# Patient Record
Sex: Female | Born: 1945 | Race: Black or African American | Hispanic: No | Smoking: Never smoker
Health system: Southern US, Community
[De-identification: ages and names within clinical notes are randomized; demographics above are authoritative.]

## PROBLEM LIST (undated history)

## (undated) DIAGNOSIS — R51 Headache: Secondary | ICD-10-CM

## (undated) DIAGNOSIS — Z8619 Personal history of other infectious and parasitic diseases: Secondary | ICD-10-CM

## (undated) DIAGNOSIS — R519 Headache, unspecified: Secondary | ICD-10-CM

## (undated) DIAGNOSIS — E119 Type 2 diabetes mellitus without complications: Secondary | ICD-10-CM

## (undated) DIAGNOSIS — R1310 Dysphagia, unspecified: Secondary | ICD-10-CM

## (undated) DIAGNOSIS — M549 Dorsalgia, unspecified: Secondary | ICD-10-CM

## (undated) DIAGNOSIS — E785 Hyperlipidemia, unspecified: Secondary | ICD-10-CM

## (undated) DIAGNOSIS — T7840XA Allergy, unspecified, initial encounter: Secondary | ICD-10-CM

## (undated) DIAGNOSIS — K219 Gastro-esophageal reflux disease without esophagitis: Secondary | ICD-10-CM

## (undated) DIAGNOSIS — I10 Essential (primary) hypertension: Secondary | ICD-10-CM

## (undated) DIAGNOSIS — M1990 Unspecified osteoarthritis, unspecified site: Secondary | ICD-10-CM

## (undated) DIAGNOSIS — K259 Gastric ulcer, unspecified as acute or chronic, without hemorrhage or perforation: Secondary | ICD-10-CM

## (undated) DIAGNOSIS — G43909 Migraine, unspecified, not intractable, without status migrainosus: Secondary | ICD-10-CM

## (undated) DIAGNOSIS — E079 Disorder of thyroid, unspecified: Secondary | ICD-10-CM

## (undated) DIAGNOSIS — J45909 Unspecified asthma, uncomplicated: Secondary | ICD-10-CM

## (undated) HISTORY — DX: Dysphagia, unspecified: R13.10

## (undated) HISTORY — PX: CHOLECYSTECTOMY: SHX55

## (undated) HISTORY — DX: Personal history of other infectious and parasitic diseases: Z86.19

## (undated) HISTORY — DX: Headache, unspecified: R51.9

## (undated) HISTORY — DX: Disorder of thyroid, unspecified: E07.9

## (undated) HISTORY — DX: Unspecified osteoarthritis, unspecified site: M19.90

## (undated) HISTORY — DX: Migraine, unspecified, not intractable, without status migrainosus: G43.909

## (undated) HISTORY — DX: Headache: R51

## (undated) HISTORY — DX: Unspecified asthma, uncomplicated: J45.909

## (undated) HISTORY — DX: Gastro-esophageal reflux disease without esophagitis: K21.9

## (undated) HISTORY — PX: CATARACT EXTRACTION: SUR2

## (undated) HISTORY — PX: ESOPHAGEAL DILATION: SHX303

## (undated) HISTORY — DX: Dorsalgia, unspecified: M54.9

## (undated) HISTORY — PX: ABDOMINAL HYSTERECTOMY: SHX81

## (undated) HISTORY — DX: Allergy, unspecified, initial encounter: T78.40XA

## (undated) HISTORY — DX: Gastric ulcer, unspecified as acute or chronic, without hemorrhage or perforation: K25.9

## (undated) HISTORY — DX: Hyperlipidemia, unspecified: E78.5

---

## 2009-11-02 ENCOUNTER — Emergency Department (HOSPITAL_BASED_OUTPATIENT_CLINIC_OR_DEPARTMENT_OTHER): Admission: EM | Admit: 2009-11-02 | Discharge: 2009-11-02 | Payer: Self-pay | Admitting: Emergency Medicine

## 2009-11-02 ENCOUNTER — Ambulatory Visit: Payer: Self-pay | Admitting: Diagnostic Radiology

## 2011-02-25 ENCOUNTER — Inpatient Hospital Stay (INDEPENDENT_AMBULATORY_CARE_PROVIDER_SITE_OTHER)
Admission: RE | Admit: 2011-02-25 | Discharge: 2011-02-25 | Disposition: A | Discharge status: Home / Self Care | Payer: Medicare Other | Source: Ambulatory Visit | Attending: Family Medicine | Admitting: Family Medicine

## 2011-02-25 ENCOUNTER — Ambulatory Visit (INDEPENDENT_AMBULATORY_CARE_PROVIDER_SITE_OTHER): Payer: Medicare Other

## 2011-02-25 DIAGNOSIS — J309 Allergic rhinitis, unspecified: Secondary | ICD-10-CM

## 2011-02-26 ENCOUNTER — Emergency Department (HOSPITAL_COMMUNITY)
Admission: EM | Admit: 2011-02-26 | Discharge: 2011-02-26 | Disposition: A | Discharge status: Home / Self Care | Payer: Medicare Other | Attending: Emergency Medicine | Admitting: Emergency Medicine

## 2011-02-26 ENCOUNTER — Inpatient Hospital Stay (INDEPENDENT_AMBULATORY_CARE_PROVIDER_SITE_OTHER)
Admission: RE | Admit: 2011-02-26 | Discharge: 2011-02-26 | Disposition: A | Discharge status: Home / Self Care | Payer: Medicare Other | Source: Ambulatory Visit | Attending: Family Medicine | Admitting: Family Medicine

## 2011-02-26 ENCOUNTER — Emergency Department (HOSPITAL_COMMUNITY): Payer: Medicare Other

## 2011-02-26 DIAGNOSIS — R05 Cough: Secondary | ICD-10-CM | POA: Insufficient documentation

## 2011-02-26 DIAGNOSIS — R079 Chest pain, unspecified: Secondary | ICD-10-CM

## 2011-02-26 DIAGNOSIS — R071 Chest pain on breathing: Secondary | ICD-10-CM | POA: Insufficient documentation

## 2011-02-26 DIAGNOSIS — I10 Essential (primary) hypertension: Secondary | ICD-10-CM | POA: Insufficient documentation

## 2011-02-26 DIAGNOSIS — M79609 Pain in unspecified limb: Secondary | ICD-10-CM | POA: Insufficient documentation

## 2011-02-26 DIAGNOSIS — R0602 Shortness of breath: Secondary | ICD-10-CM | POA: Insufficient documentation

## 2011-02-26 DIAGNOSIS — R059 Cough, unspecified: Secondary | ICD-10-CM | POA: Insufficient documentation

## 2011-02-26 DIAGNOSIS — E039 Hypothyroidism, unspecified: Secondary | ICD-10-CM | POA: Insufficient documentation

## 2011-02-26 DIAGNOSIS — Z9889 Other specified postprocedural states: Secondary | ICD-10-CM | POA: Insufficient documentation

## 2011-02-26 LAB — BASIC METABOLIC PANEL
BUN: 8 mg/dL (ref 6–23)
CO2: 24 mEq/L (ref 19–32)
Calcium: 9.8 mg/dL (ref 8.4–10.5)
Chloride: 95 mEq/L — ABNORMAL LOW (ref 96–112)
Creatinine, Ser: 0.72 mg/dL (ref 0.50–1.10)
GFR calc Af Amer: >60 mL/min (ref >60)
GFR calc non Af Amer: >60 mL/min (ref >60)
Glucose, Bld: 89 mg/dL (ref 70–99)
Potassium: 4.2 mEq/L (ref 3.5–5.1)
Sodium: 132 mEq/L — ABNORMAL LOW (ref 135–145)

## 2011-02-26 LAB — CBC
HCT: 39.9 % (ref 36.0–46.0)
Hemoglobin: 12.9 g/dL (ref 12.0–15.0)
MCH: 25.6 pg — ABNORMAL LOW (ref 26.0–34.0)
MCHC: 32.3 g/dL (ref 30.0–36.0)
MCV: 79.3 fL (ref 78.0–100.0)
Platelets: 258 10*3/uL (ref 150–400)
RBC: 5.03 MIL/uL (ref 3.87–5.11)
RDW: 14.7 % (ref 11.5–15.5)
WBC: 4.8 10*3/uL (ref 4.0–10.5)

## 2011-02-26 LAB — DIFFERENTIAL
Basophils Absolute: 0 10*3/uL (ref 0.0–0.1)
Basophils Relative: 0 % (ref 0–1)
Eosinophils Absolute: 0.1 10*3/uL (ref 0.0–0.7)
Eosinophils Relative: 2 % (ref 0–5)
Lymphocytes Relative: 41 % (ref 12–46)
Lymphs Abs: 2 10*3/uL (ref 0.7–4.0)
Monocytes Absolute: 0.4 10*3/uL (ref 0.1–1.0)
Monocytes Relative: 9 % (ref 3–12)
Neutro Abs: 2.4 10*3/uL (ref 1.7–7.7)
Neutrophils Relative %: 49 % (ref 43–77)

## 2011-02-26 LAB — TROPONIN I: Troponin I: <0.3 ng/mL (ref <0.30)

## 2011-02-26 LAB — CK TOTAL AND CKMB (NOT AT ARMC)
CK, MB: 5.4 ng/mL — ABNORMAL HIGH (ref 0.3–4.0)
Relative Index: 1.3 (ref 0.0–2.5)
Total CK: 410 U/L — ABNORMAL HIGH (ref 7–177)

## 2011-02-26 LAB — D-DIMER, QUANTITATIVE: D-Dimer, Quant: 0.45 ug/mL-FEU (ref 0.00–0.48)

## 2012-09-26 ENCOUNTER — Emergency Department (HOSPITAL_COMMUNITY)
Admission: EM | Admit: 2012-09-26 | Discharge: 2012-09-26 | Disposition: A | Discharge status: Home / Self Care | Payer: No Typology Code available for payment source | Attending: Emergency Medicine | Admitting: Emergency Medicine

## 2012-09-26 ENCOUNTER — Encounter (HOSPITAL_COMMUNITY): Payer: Self-pay | Admitting: Emergency Medicine

## 2012-09-26 DIAGNOSIS — Y9389 Activity, other specified: Secondary | ICD-10-CM | POA: Insufficient documentation

## 2012-09-26 DIAGNOSIS — E119 Type 2 diabetes mellitus without complications: Secondary | ICD-10-CM | POA: Insufficient documentation

## 2012-09-26 DIAGNOSIS — S46909A Unspecified injury of unspecified muscle, fascia and tendon at shoulder and upper arm level, unspecified arm, initial encounter: Secondary | ICD-10-CM | POA: Insufficient documentation

## 2012-09-26 DIAGNOSIS — IMO0002 Reserved for concepts with insufficient information to code with codable children: Secondary | ICD-10-CM | POA: Insufficient documentation

## 2012-09-26 DIAGNOSIS — R51 Headache: Secondary | ICD-10-CM | POA: Insufficient documentation

## 2012-09-26 DIAGNOSIS — Y92410 Unspecified street and highway as the place of occurrence of the external cause: Secondary | ICD-10-CM | POA: Insufficient documentation

## 2012-09-26 DIAGNOSIS — S298XXA Other specified injuries of thorax, initial encounter: Secondary | ICD-10-CM | POA: Insufficient documentation

## 2012-09-26 DIAGNOSIS — S4980XA Other specified injuries of shoulder and upper arm, unspecified arm, initial encounter: Secondary | ICD-10-CM | POA: Insufficient documentation

## 2012-09-26 DIAGNOSIS — I10 Essential (primary) hypertension: Secondary | ICD-10-CM | POA: Insufficient documentation

## 2012-09-26 DIAGNOSIS — T148XXA Other injury of unspecified body region, initial encounter: Secondary | ICD-10-CM

## 2012-09-26 DIAGNOSIS — R110 Nausea: Secondary | ICD-10-CM | POA: Insufficient documentation

## 2012-09-26 HISTORY — DX: Essential (primary) hypertension: I10

## 2012-09-26 HISTORY — DX: Type 2 diabetes mellitus without complications: E11.9

## 2012-09-26 MED ORDER — CYCLOBENZAPRINE HCL 10 MG PO TABS
5.0000 mg | ORAL_TABLET | Freq: Once | ORAL | Status: AC
  Administered 2012-09-26: 5 mg via ORAL
  Filled 2012-09-26: qty 1

## 2012-09-26 MED ORDER — CYCLOBENZAPRINE HCL 10 MG PO TABS: 5.0000 mg | ORAL_TABLET | Freq: Two times a day (BID) | ORAL | Status: DC | PRN
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
## 2012-09-26 NOTE — ED Provider Notes (Deleted)
I  reviewed the resident's note and I agree with the findings and plan.     Nelia Shi, MD 09/26/12 [phone]

## 2015-09-28 DIAGNOSIS — Z8673 Personal history of transient ischemic attack (TIA), and cerebral infarction without residual deficits: Secondary | ICD-10-CM | POA: Insufficient documentation

## 2015-09-28 DIAGNOSIS — G43909 Migraine, unspecified, not intractable, without status migrainosus: Secondary | ICD-10-CM | POA: Insufficient documentation

## 2015-09-28 DIAGNOSIS — J300 Vasomotor rhinitis: Secondary | ICD-10-CM | POA: Insufficient documentation

## 2015-09-28 DIAGNOSIS — J449 Chronic obstructive pulmonary disease, unspecified: Secondary | ICD-10-CM | POA: Insufficient documentation

## 2015-09-28 DIAGNOSIS — K5909 Other constipation: Secondary | ICD-10-CM | POA: Insufficient documentation

## 2015-09-28 DIAGNOSIS — I739 Peripheral vascular disease, unspecified: Secondary | ICD-10-CM | POA: Insufficient documentation

## 2015-09-28 DIAGNOSIS — E669 Obesity, unspecified: Secondary | ICD-10-CM | POA: Insufficient documentation

## 2015-09-28 DIAGNOSIS — K219 Gastro-esophageal reflux disease without esophagitis: Secondary | ICD-10-CM | POA: Insufficient documentation

## 2016-04-23 DIAGNOSIS — K22719 Barrett's esophagus with dysplasia, unspecified: Secondary | ICD-10-CM | POA: Insufficient documentation

## 2016-05-18 LAB — HM COLONOSCOPY

## 2016-05-31 LAB — HM DEXA SCAN: HM Dexa Scan: NORMAL

## 2016-07-07 DIAGNOSIS — I10 Essential (primary) hypertension: Secondary | ICD-10-CM | POA: Diagnosis not present

## 2016-07-07 DIAGNOSIS — E7800 Pure hypercholesterolemia, unspecified: Secondary | ICD-10-CM | POA: Diagnosis not present

## 2016-07-07 DIAGNOSIS — M8589 Other specified disorders of bone density and structure, multiple sites: Secondary | ICD-10-CM | POA: Diagnosis not present

## 2016-07-07 DIAGNOSIS — R69 Illness, unspecified: Secondary | ICD-10-CM | POA: Diagnosis not present

## 2016-07-07 DIAGNOSIS — E042 Nontoxic multinodular goiter: Secondary | ICD-10-CM | POA: Diagnosis not present

## 2016-07-07 DIAGNOSIS — Z0000 Encounter for general adult medical examination without abnormal findings: Secondary | ICD-10-CM | POA: Diagnosis not present

## 2016-07-07 DIAGNOSIS — E119 Type 2 diabetes mellitus without complications: Secondary | ICD-10-CM | POA: Diagnosis not present

## 2016-07-08 DIAGNOSIS — E042 Nontoxic multinodular goiter: Secondary | ICD-10-CM | POA: Diagnosis not present

## 2016-07-14 DIAGNOSIS — R69 Illness, unspecified: Secondary | ICD-10-CM | POA: Diagnosis not present

## 2016-07-20 DIAGNOSIS — K210 Gastro-esophageal reflux disease with esophagitis: Secondary | ICD-10-CM | POA: Diagnosis not present

## 2016-07-20 DIAGNOSIS — E042 Nontoxic multinodular goiter: Secondary | ICD-10-CM | POA: Diagnosis not present

## 2016-08-16 DIAGNOSIS — B373 Candidiasis of vulva and vagina: Secondary | ICD-10-CM | POA: Diagnosis not present

## 2016-09-05 DIAGNOSIS — E669 Obesity, unspecified: Secondary | ICD-10-CM | POA: Diagnosis not present

## 2016-09-05 DIAGNOSIS — Z7951 Long term (current) use of inhaled steroids: Secondary | ICD-10-CM | POA: Diagnosis not present

## 2016-09-05 DIAGNOSIS — J449 Chronic obstructive pulmonary disease, unspecified: Secondary | ICD-10-CM | POA: Diagnosis not present

## 2016-09-05 DIAGNOSIS — E119 Type 2 diabetes mellitus without complications: Secondary | ICD-10-CM | POA: Diagnosis not present

## 2016-09-05 DIAGNOSIS — E7800 Pure hypercholesterolemia, unspecified: Secondary | ICD-10-CM | POA: Diagnosis not present

## 2016-09-05 DIAGNOSIS — I10 Essential (primary) hypertension: Secondary | ICD-10-CM | POA: Diagnosis not present

## 2016-09-05 DIAGNOSIS — Z6832 Body mass index (BMI) 32.0-32.9, adult: Secondary | ICD-10-CM | POA: Diagnosis not present

## 2016-09-05 DIAGNOSIS — Z0000 Encounter for general adult medical examination without abnormal findings: Secondary | ICD-10-CM | POA: Diagnosis not present

## 2016-09-05 DIAGNOSIS — Z7984 Long term (current) use of oral hypoglycemic drugs: Secondary | ICD-10-CM | POA: Diagnosis not present

## 2016-09-05 DIAGNOSIS — R69 Illness, unspecified: Secondary | ICD-10-CM | POA: Diagnosis not present

## 2016-09-07 DIAGNOSIS — R69 Illness, unspecified: Secondary | ICD-10-CM | POA: Diagnosis not present

## 2016-09-10 DIAGNOSIS — K0889 Other specified disorders of teeth and supporting structures: Secondary | ICD-10-CM | POA: Diagnosis not present

## 2016-09-10 DIAGNOSIS — M25522 Pain in left elbow: Secondary | ICD-10-CM | POA: Diagnosis not present

## 2016-09-10 DIAGNOSIS — M5412 Radiculopathy, cervical region: Secondary | ICD-10-CM | POA: Insufficient documentation

## 2016-10-05 DIAGNOSIS — E119 Type 2 diabetes mellitus without complications: Secondary | ICD-10-CM | POA: Diagnosis not present

## 2016-10-05 DIAGNOSIS — J300 Vasomotor rhinitis: Secondary | ICD-10-CM | POA: Diagnosis not present

## 2016-10-05 DIAGNOSIS — M8589 Other specified disorders of bone density and structure, multiple sites: Secondary | ICD-10-CM | POA: Insufficient documentation

## 2016-10-05 DIAGNOSIS — L089 Local infection of the skin and subcutaneous tissue, unspecified: Secondary | ICD-10-CM | POA: Diagnosis not present

## 2016-10-05 DIAGNOSIS — R51 Headache: Secondary | ICD-10-CM | POA: Diagnosis not present

## 2016-10-06 DIAGNOSIS — L089 Local infection of the skin and subcutaneous tissue, unspecified: Secondary | ICD-10-CM | POA: Insufficient documentation

## 2016-10-14 DIAGNOSIS — E119 Type 2 diabetes mellitus without complications: Secondary | ICD-10-CM | POA: Diagnosis not present

## 2016-10-17 DIAGNOSIS — M62838 Other muscle spasm: Secondary | ICD-10-CM | POA: Diagnosis not present

## 2016-10-17 DIAGNOSIS — E7800 Pure hypercholesterolemia, unspecified: Secondary | ICD-10-CM | POA: Diagnosis not present

## 2016-10-17 DIAGNOSIS — J3089 Other allergic rhinitis: Secondary | ICD-10-CM | POA: Diagnosis not present

## 2016-10-17 DIAGNOSIS — R109 Unspecified abdominal pain: Secondary | ICD-10-CM | POA: Diagnosis not present

## 2016-10-17 DIAGNOSIS — E119 Type 2 diabetes mellitus without complications: Secondary | ICD-10-CM | POA: Diagnosis not present

## 2016-10-17 DIAGNOSIS — J300 Vasomotor rhinitis: Secondary | ICD-10-CM | POA: Diagnosis not present

## 2016-10-17 DIAGNOSIS — Z0000 Encounter for general adult medical examination without abnormal findings: Secondary | ICD-10-CM | POA: Diagnosis not present

## 2016-10-17 DIAGNOSIS — Z23 Encounter for immunization: Secondary | ICD-10-CM | POA: Diagnosis not present

## 2016-10-18 DIAGNOSIS — E119 Type 2 diabetes mellitus without complications: Secondary | ICD-10-CM | POA: Diagnosis not present

## 2016-11-03 DIAGNOSIS — H2513 Age-related nuclear cataract, bilateral: Secondary | ICD-10-CM | POA: Diagnosis not present

## 2016-11-03 DIAGNOSIS — H25013 Cortical age-related cataract, bilateral: Secondary | ICD-10-CM | POA: Diagnosis not present

## 2016-11-03 DIAGNOSIS — H52203 Unspecified astigmatism, bilateral: Secondary | ICD-10-CM | POA: Diagnosis not present

## 2016-11-03 DIAGNOSIS — E119 Type 2 diabetes mellitus without complications: Secondary | ICD-10-CM | POA: Diagnosis not present

## 2016-11-03 DIAGNOSIS — H524 Presbyopia: Secondary | ICD-10-CM | POA: Diagnosis not present

## 2016-11-03 DIAGNOSIS — H5203 Hypermetropia, bilateral: Secondary | ICD-10-CM | POA: Diagnosis not present

## 2016-12-16 DIAGNOSIS — R69 Illness, unspecified: Secondary | ICD-10-CM | POA: Diagnosis not present

## 2017-02-02 DIAGNOSIS — R69 Illness, unspecified: Secondary | ICD-10-CM | POA: Diagnosis not present

## 2017-02-21 DIAGNOSIS — R69 Illness, unspecified: Secondary | ICD-10-CM | POA: Diagnosis not present

## 2017-04-25 DIAGNOSIS — R69 Illness, unspecified: Secondary | ICD-10-CM | POA: Diagnosis not present

## 2017-04-26 DIAGNOSIS — Z111 Encounter for screening for respiratory tuberculosis: Secondary | ICD-10-CM | POA: Diagnosis not present

## 2017-06-27 DIAGNOSIS — R69 Illness, unspecified: Secondary | ICD-10-CM | POA: Diagnosis not present

## 2017-07-07 DIAGNOSIS — L308 Other specified dermatitis: Secondary | ICD-10-CM | POA: Diagnosis not present

## 2017-07-07 DIAGNOSIS — J441 Chronic obstructive pulmonary disease with (acute) exacerbation: Secondary | ICD-10-CM | POA: Diagnosis not present

## 2017-07-07 DIAGNOSIS — E559 Vitamin D deficiency, unspecified: Secondary | ICD-10-CM | POA: Diagnosis not present

## 2017-07-07 DIAGNOSIS — R69 Illness, unspecified: Secondary | ICD-10-CM | POA: Diagnosis not present

## 2017-07-07 DIAGNOSIS — L304 Erythema intertrigo: Secondary | ICD-10-CM | POA: Diagnosis not present

## 2017-07-07 DIAGNOSIS — Z1159 Encounter for screening for other viral diseases: Secondary | ICD-10-CM | POA: Diagnosis not present

## 2017-07-07 DIAGNOSIS — E7800 Pure hypercholesterolemia, unspecified: Secondary | ICD-10-CM | POA: Diagnosis not present

## 2017-07-07 DIAGNOSIS — Z0000 Encounter for general adult medical examination without abnormal findings: Secondary | ICD-10-CM | POA: Diagnosis not present

## 2017-07-07 DIAGNOSIS — E119 Type 2 diabetes mellitus without complications: Secondary | ICD-10-CM | POA: Diagnosis not present

## 2017-07-07 LAB — HM HEPATITIS C SCREENING LAB: HM Hepatitis Screen: NEGATIVE

## 2017-07-19 DIAGNOSIS — R69 Illness, unspecified: Secondary | ICD-10-CM | POA: Diagnosis not present

## 2017-09-15 DIAGNOSIS — S20212A Contusion of left front wall of thorax, initial encounter: Secondary | ICD-10-CM | POA: Diagnosis not present

## 2017-09-15 DIAGNOSIS — M25519 Pain in unspecified shoulder: Secondary | ICD-10-CM | POA: Diagnosis not present

## 2017-09-15 DIAGNOSIS — T148XXA Other injury of unspecified body region, initial encounter: Secondary | ICD-10-CM | POA: Diagnosis not present

## 2017-10-05 DIAGNOSIS — K8020 Calculus of gallbladder without cholecystitis without obstruction: Secondary | ICD-10-CM | POA: Diagnosis not present

## 2017-10-05 DIAGNOSIS — J300 Vasomotor rhinitis: Secondary | ICD-10-CM | POA: Diagnosis not present

## 2017-10-05 DIAGNOSIS — E7800 Pure hypercholesterolemia, unspecified: Secondary | ICD-10-CM | POA: Diagnosis not present

## 2017-10-05 DIAGNOSIS — I10 Essential (primary) hypertension: Secondary | ICD-10-CM | POA: Diagnosis not present

## 2017-10-05 DIAGNOSIS — K210 Gastro-esophageal reflux disease with esophagitis: Secondary | ICD-10-CM | POA: Diagnosis not present

## 2017-10-05 DIAGNOSIS — E119 Type 2 diabetes mellitus without complications: Secondary | ICD-10-CM | POA: Diagnosis not present

## 2017-11-06 DIAGNOSIS — H2513 Age-related nuclear cataract, bilateral: Secondary | ICD-10-CM | POA: Diagnosis not present

## 2017-11-06 DIAGNOSIS — H524 Presbyopia: Secondary | ICD-10-CM | POA: Diagnosis not present

## 2017-11-06 DIAGNOSIS — H25013 Cortical age-related cataract, bilateral: Secondary | ICD-10-CM | POA: Diagnosis not present

## 2017-11-06 DIAGNOSIS — E119 Type 2 diabetes mellitus without complications: Secondary | ICD-10-CM | POA: Diagnosis not present

## 2017-12-07 DIAGNOSIS — R05 Cough: Secondary | ICD-10-CM | POA: Diagnosis not present

## 2017-12-07 DIAGNOSIS — R0602 Shortness of breath: Secondary | ICD-10-CM | POA: Diagnosis not present

## 2017-12-07 DIAGNOSIS — J189 Pneumonia, unspecified organism: Secondary | ICD-10-CM | POA: Diagnosis not present

## 2017-12-14 DIAGNOSIS — Z760 Encounter for issue of repeat prescription: Secondary | ICD-10-CM | POA: Diagnosis not present

## 2017-12-14 DIAGNOSIS — J189 Pneumonia, unspecified organism: Secondary | ICD-10-CM | POA: Diagnosis not present

## 2017-12-14 DIAGNOSIS — R69 Illness, unspecified: Secondary | ICD-10-CM | POA: Diagnosis not present

## 2017-12-22 DIAGNOSIS — J441 Chronic obstructive pulmonary disease with (acute) exacerbation: Secondary | ICD-10-CM | POA: Diagnosis not present

## 2017-12-22 DIAGNOSIS — L659 Nonscarring hair loss, unspecified: Secondary | ICD-10-CM | POA: Diagnosis not present

## 2017-12-22 DIAGNOSIS — I10 Essential (primary) hypertension: Secondary | ICD-10-CM | POA: Diagnosis not present

## 2017-12-22 DIAGNOSIS — R600 Localized edema: Secondary | ICD-10-CM | POA: Diagnosis not present

## 2018-01-12 DIAGNOSIS — I10 Essential (primary) hypertension: Secondary | ICD-10-CM | POA: Diagnosis not present

## 2018-02-02 DIAGNOSIS — R600 Localized edema: Secondary | ICD-10-CM | POA: Insufficient documentation

## 2018-02-08 DIAGNOSIS — I739 Peripheral vascular disease, unspecified: Secondary | ICD-10-CM | POA: Diagnosis not present

## 2018-02-08 DIAGNOSIS — I10 Essential (primary) hypertension: Secondary | ICD-10-CM | POA: Diagnosis not present

## 2018-02-08 DIAGNOSIS — Z23 Encounter for immunization: Secondary | ICD-10-CM | POA: Diagnosis not present

## 2018-02-08 DIAGNOSIS — R600 Localized edema: Secondary | ICD-10-CM | POA: Diagnosis not present

## 2018-02-08 DIAGNOSIS — T7840XS Allergy, unspecified, sequela: Secondary | ICD-10-CM | POA: Diagnosis not present

## 2018-02-08 DIAGNOSIS — E119 Type 2 diabetes mellitus without complications: Secondary | ICD-10-CM | POA: Diagnosis not present

## 2018-02-08 DIAGNOSIS — R69 Illness, unspecified: Secondary | ICD-10-CM | POA: Diagnosis not present

## 2018-02-08 DIAGNOSIS — E7800 Pure hypercholesterolemia, unspecified: Secondary | ICD-10-CM | POA: Diagnosis not present

## 2018-06-06 ENCOUNTER — Ambulatory Visit (INDEPENDENT_AMBULATORY_CARE_PROVIDER_SITE_OTHER): Payer: Medicare HMO

## 2018-06-06 ENCOUNTER — Ambulatory Visit (INDEPENDENT_AMBULATORY_CARE_PROVIDER_SITE_OTHER): Payer: Medicare HMO | Admitting: Internal Medicine

## 2018-06-06 VITALS — BP 136/90 | HR 78 | Temp 98.6°F | Ht 65.0 in | Wt 192.0 lb

## 2018-06-06 DIAGNOSIS — N644 Mastodynia: Secondary | ICD-10-CM

## 2018-06-06 DIAGNOSIS — E042 Nontoxic multinodular goiter: Secondary | ICD-10-CM | POA: Diagnosis not present

## 2018-06-06 DIAGNOSIS — E785 Hyperlipidemia, unspecified: Secondary | ICD-10-CM

## 2018-06-06 DIAGNOSIS — Z8701 Personal history of pneumonia (recurrent): Secondary | ICD-10-CM

## 2018-06-06 DIAGNOSIS — M79675 Pain in left toe(s): Secondary | ICD-10-CM

## 2018-06-06 DIAGNOSIS — Z8249 Family history of ischemic heart disease and other diseases of the circulatory system: Secondary | ICD-10-CM

## 2018-06-06 DIAGNOSIS — M19072 Primary osteoarthritis, left ankle and foot: Secondary | ICD-10-CM | POA: Diagnosis not present

## 2018-06-06 DIAGNOSIS — E119 Type 2 diabetes mellitus without complications: Secondary | ICD-10-CM

## 2018-06-06 DIAGNOSIS — Z87898 Personal history of other specified conditions: Secondary | ICD-10-CM | POA: Diagnosis not present

## 2018-06-06 DIAGNOSIS — J4520 Mild intermittent asthma, uncomplicated: Secondary | ICD-10-CM

## 2018-06-06 DIAGNOSIS — R079 Chest pain, unspecified: Secondary | ICD-10-CM

## 2018-06-06 DIAGNOSIS — M1990 Unspecified osteoarthritis, unspecified site: Secondary | ICD-10-CM

## 2018-06-06 DIAGNOSIS — H9202 Otalgia, left ear: Secondary | ICD-10-CM | POA: Diagnosis not present

## 2018-06-06 DIAGNOSIS — I10 Essential (primary) hypertension: Secondary | ICD-10-CM

## 2018-06-06 DIAGNOSIS — J189 Pneumonia, unspecified organism: Secondary | ICD-10-CM | POA: Diagnosis not present

## 2018-06-06 LAB — CBC WITH DIFFERENTIAL/PLATELET
Basophils Absolute: 0 10*3/uL (ref 0.0–0.1)
Basophils Relative: 1.1 % (ref 0.0–3.0)
Eosinophils Absolute: 0.1 10*3/uL (ref 0.0–0.7)
Eosinophils Relative: 2.6 % (ref 0.0–5.0)
HCT: 45.6 % (ref 36.0–46.0)
Hemoglobin: 15.3 g/dL — ABNORMAL HIGH (ref 12.0–15.0)
Lymphocytes Relative: 31.7 % (ref 12.0–46.0)
Lymphs Abs: 1.3 10*3/uL (ref 0.7–4.0)
MCHC: 33.6 g/dL (ref 30.0–36.0)
MCV: 88.6 fl (ref 78.0–100.0)
Monocytes Absolute: 0.4 10*3/uL (ref 0.1–1.0)
Monocytes Relative: 9.5 % (ref 3.0–12.0)
Neutro Abs: 2.2 10*3/uL (ref 1.4–7.7)
Neutrophils Relative %: 55.1 % (ref 43.0–77.0)
Platelets: 244 10*3/uL (ref 150.0–400.0)
RBC: 5.14 Mil/uL — ABNORMAL HIGH (ref 3.87–5.11)
RDW: 13.5 % (ref 11.5–15.5)
WBC: 4 10*3/uL (ref 4.0–10.5)

## 2018-06-06 LAB — COMPREHENSIVE METABOLIC PANEL
ALT: 16 U/L (ref 0–35)
AST: 22 U/L (ref 0–37)
Albumin: 4.3 g/dL (ref 3.5–5.2)
Alkaline Phosphatase: 74 U/L (ref 39–117)
BUN: 8 mg/dL (ref 6–23)
CO2: 29 mEq/L (ref 19–32)
Calcium: 9.8 mg/dL (ref 8.4–10.5)
Chloride: 102 mEq/L (ref 96–112)
Creatinine, Ser: 0.83 mg/dL (ref 0.40–1.20)
GFR: 87.05 mL/min (ref >60.00)
Glucose, Bld: 105 mg/dL — ABNORMAL HIGH (ref 70–99)
Potassium: 4.1 mEq/L (ref 3.5–5.1)
Sodium: 139 mEq/L (ref 135–145)
Total Bilirubin: 0.5 mg/dL (ref 0.2–1.2)
Total Protein: 8.1 g/dL (ref 6.0–8.3)

## 2018-06-06 LAB — LIPID PANEL
Cholesterol: 272 mg/dL — ABNORMAL HIGH (ref 0–200)
HDL: 55.3 mg/dL (ref >39.00)
LDL Cholesterol: 190 mg/dL — ABNORMAL HIGH (ref 0–99)
NonHDL: 216.29
Total CHOL/HDL Ratio: 5
Triglycerides: 130 mg/dL (ref 0.0–149.0)
VLDL: 26 mg/dL (ref 0.0–40.0)

## 2018-06-06 LAB — T4, FREE: Free T4: 0.8 ng/dL (ref 0.60–1.60)

## 2018-06-06 LAB — HEMOGLOBIN A1C: Hgb A1c MFr Bld: 6.5 % (ref 4.6–6.5)

## 2018-06-06 LAB — URIC ACID: Uric Acid, Serum: 4.1 mg/dL (ref 2.4–7.0)

## 2018-06-06 LAB — TSH: TSH: 0.86 u[IU]/mL (ref 0.35–4.50)

## 2018-06-06 NOTE — Patient Instructions (Signed)
We have ordered and echo, thyroid ultrasound, mammogram, labs today, chest xray and left toe Xray today  F/u in 4 weeks   Gout Gout is painful swelling that can happen in some of your joints. Gout is a type of arthritis. This condition is caused by having too much uric acid in your body. Uric acid is a chemical that is made when your body breaks down substances called purines. If your body has too much uric acid, sharp crystals can form and build up in your joints. This causes pain and swelling. Gout attacks can happen quickly and be very painful (acute gout). Over time, the attacks can affect more joints and happen more often (chronic gout). Follow these instructions at home: During a Gout Attack  If directed, put ice on the painful area: ? Put ice in a plastic bag. ? Place a towel between your skin and the bag. ? Leave the ice on for 20 minutes, 2-3 times a day.  Rest the joint as much as possible. If the joint is in your leg, you may be given crutches to use.  Raise (elevate) the painful joint above the level of your heart as often as you can.  Drink enough fluids to keep your pee (urine) clear or pale yellow.  Take over-the-counter and prescription medicines only as told by your doctor.  Do not drive or use heavy machinery while taking prescription pain medicine.  Follow instructions from your doctor about what you can or cannot eat and drink.  Return to your normal activities as told by your doctor. Ask your doctor what activities are safe for you. Avoiding Future Gout Attacks  Follow a low-purine diet as told by a specialist (dietitian) or your doctor. Avoid foods and drinks that have a lot of purines, such as: ? Liver. ? Kidney. ? Anchovies. ? Asparagus. ? Herring. ? Mushrooms ? Mussels. ? Beer.  Limit alcohol intake to no more than 1 drink a day for nonpregnant women and 2 drinks a day for men. One drink equals 12 oz of beer, 5 oz of wine, or 1 oz of hard  liquor.  Stay at a healthy weight or lose weight if you are overweight. If you want to lose weight, talk with your doctor. It is important that you do not lose weight too fast.  Start or continue an exercise plan as told by your doctor.  Drink enough fluids to keep your pee clear or pale yellow.  Take over-the-counter and prescription medicines only as told by your doctor.  Keep all follow-up visits as told by your doctor. This is important. Contact a doctor if:  You have another gout attack.  You still have symptoms of a gout attack after10 days of treatment.  You have problems (side effects) because of your medicines.  You have chills or a fever.  You have burning pain when you pee (urinate).  You have pain in your lower back or belly. Get help right away if:  You have very bad pain.  Your pain cannot be controlled.  You cannot pee. This information is not intended to replace advice given to you by your health care provider. Make sure you discuss any questions you have with your health care provider. Document Released: 04/26/2008 Document Revised: 12/24/2015 Document Reviewed: 04/30/2015 Elsevier Interactive Patient Education  2018 [employer].  Breast Tenderness Breast tenderness is a common problem for women of all ages. Breast tenderness may cause mild discomfort to severe pain. The pain usually  comes and goes in association with your menstrual cycle, but it can be constant. Breast tenderness has many possible causes, including hormone changes and some medicines. Your health care provider may order tests, such as a mammogram or an ultrasound, to check for any unusual findings. Having breast tenderness usually does not mean that you have breast cancer. Follow these instructions at home: Sometimes, reassurance that you do not have breast cancer is all that is needed. In general, follow these home care instructions: Managing pain and discomfort  If directed, apply ice to  the area: ? Put ice in a plastic bag. ? Place a towel between your skin and the bag. ? Leave the ice on for 20 minutes, 2-3 times a day.  Make sure you are wearing a supportive bra, especially during exercise. You may also want to wear a supportive bra while sleeping if your breasts are very tender. Medicines  Take over-the-counter and prescription medicines only as told by your health care provider. If the cause of your pain is infection, you may be prescribed an antibiotic medicine.  If you were prescribed an antibiotic, take it as told by your health care provider. Do not stop taking the antibiotic even if you start to feel better. General instructions  Your health care provider may recommend that you reduce the amount of fat in your diet. You can do this by: ? Limiting fried foods. ? Cooking foods using methods, such as baking, boiling, grilling, and broiling.  Decrease the amount of caffeine in your diet. You can do this by drinking more water and choosing caffeine-free options.  Keep a log of the days and times when your breasts are most tender.  Ask your health care provider how to do breast exams at home. This will help you notice if you have an unusual growth or lump. Contact a health care provider if:  Any part of your breast is hard, red, and hot to the touch. This may be a sign of infection.  You are not breastfeeding and you have fluid, especially blood or pus, coming out of your nipples.  You have a fever.  You have a new or painful lump in your breast that remains after your menstrual period ends.  Your pain does not improve or it gets worse.  Your pain is interfering with your daily activities. This information is not intended to replace advice given to you by your health care provider. Make sure you discuss any questions you have with your health care provider. Document Released: 06/30/2008 Document Revised: 04/15/2016 Document Reviewed: 04/15/2016 Elsevier  Interactive Patient Education  [employer].

## 2018-06-06 NOTE — Progress Notes (Signed)
Pre visit review using our clinic review tool, if applicable. No additional management support is needed unless otherwise documented below in the visit note. 

## 2018-06-07 ENCOUNTER — Encounter: Payer: Self-pay | Admitting: Internal Medicine

## 2018-06-07 ENCOUNTER — Other Ambulatory Visit: Payer: Self-pay | Admitting: Internal Medicine

## 2018-06-07 DIAGNOSIS — E119 Type 2 diabetes mellitus without complications: Secondary | ICD-10-CM | POA: Insufficient documentation

## 2018-06-07 DIAGNOSIS — J4520 Mild intermittent asthma, uncomplicated: Secondary | ICD-10-CM | POA: Insufficient documentation

## 2018-06-07 DIAGNOSIS — R079 Chest pain, unspecified: Secondary | ICD-10-CM | POA: Insufficient documentation

## 2018-06-07 DIAGNOSIS — E042 Nontoxic multinodular goiter: Secondary | ICD-10-CM | POA: Insufficient documentation

## 2018-06-07 DIAGNOSIS — I10 Essential (primary) hypertension: Secondary | ICD-10-CM | POA: Insufficient documentation

## 2018-06-07 DIAGNOSIS — M79675 Pain in left toe(s): Secondary | ICD-10-CM | POA: Insufficient documentation

## 2018-06-07 DIAGNOSIS — E785 Hyperlipidemia, unspecified: Secondary | ICD-10-CM | POA: Insufficient documentation

## 2018-06-07 DIAGNOSIS — M1990 Unspecified osteoarthritis, unspecified site: Secondary | ICD-10-CM | POA: Insufficient documentation

## 2018-06-07 DIAGNOSIS — E1169 Type 2 diabetes mellitus with other specified complication: Secondary | ICD-10-CM | POA: Insufficient documentation

## 2018-06-07 LAB — URINALYSIS, ROUTINE W REFLEX MICROSCOPIC
Bilirubin Urine: NEGATIVE
Glucose, UA: NEGATIVE
Hgb urine dipstick: NEGATIVE
Ketones, ur: NEGATIVE
Leukocytes, UA: NEGATIVE
Nitrite: NEGATIVE
Protein, ur: NEGATIVE
Specific Gravity, Urine: 1.01 (ref 1.001–1.03)
pH: >=8.5 — AB (ref 5.0–8.0)

## 2018-06-07 LAB — MICROALBUMIN / CREATININE URINE RATIO
Creatinine, Urine: 57 mg/dL (ref 20–275)
Microalb Creat Ratio: 25 mcg/mg creat (ref <30)
Microalb, Ur: 1.4 mg/dL

## 2018-06-07 MED ORDER — EZETIMIBE 10 MG PO TABS: 10.0000 mg | ORAL_TABLET | Freq: Every day | ORAL | 0 refills | Status: DC

## 2018-06-07 NOTE — Progress Notes (Signed)
Chief Complaint  Patient presents with  . Establish Care   New patient  1. Left toe pain intermittent mild worse with the cold weather nothing tried  2. L ear pain radiates to left neck  3. DM 2 A1C 6.2 10/05/17 on januvia was on 50 mg now on 25 mg  4. HTN on hctz 12.5 qd to bid could not tolerate spironolactone due to breast tenderness so just stopped  5. HLD intolerant to statins even livalo>nausea and rash  6. History of chest pain per pt w/o with prior PCP and was due for echo does not have a cardiologist  7. H/o thyroid goiter no thyroid US in years to f/u will re order 8. H/o atypical infection CXR 11/2017 and h/o asthma. She gets like this 1-2x per year no h/o smoking h/o asthma and 2nd hand smoke exposure    Review of Systems  Constitutional: Negative for weight loss.  HENT: Positive for ear pain. Negative for hearing loss.   Eyes: Negative for blurred vision.  Respiratory: Negative for cough and shortness of breath.   Cardiovascular: Positive for chest pain.  Gastrointestinal: Negative for abdominal pain.  Musculoskeletal: Positive for back pain, joint pain and neck pain.  Skin: Negative for rash.  Neurological: Negative for headaches.  Psychiatric/Behavioral: Negative for depression.   Past Medical History:  Diagnosis Date  . Allergy   . 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 Asthma    with h/o chronic bronchitis  . Back pain    mid back pain h/ o trauma   . Diabetes mellitus without complication ([place])   . Frequent headaches   . Gastric ulcer   . GERD (gastroesophageal reflux disease)   . History of chicken pox   . Hyperlipidemia   . Hypertension   . Migraines   . Thyroid disease    mng    Past Surgical History:  Procedure Laterality Date  . ABDOMINAL HYSTERECTOMY     DUB 1 ovary intact    Family History  Problem Relation Age of Onset  . Alcohol abuse Mother   . Heart disease Mother        CHF  . Hypertension Mother   . Hyperlipidemia  Mother   . Stroke Mother   . Mental illness Mother   . Alcohol abuse Father   . Cancer Father        colon   . Arthritis Sister   . COPD Sister   . Depression Sister   . Drug abuse Sister   . Hearing loss Sister   . Heart disease Sister        CHF  . Hyperlipidemia Sister   . Hypertension Sister   . Mental illness Sister   . COPD Brother   . Depression Brother   . Heart disease Brother   . Hyperlipidemia Brother   . Stroke Brother   . Asthma Son   . Arthritis Brother   . Depression Brother   . Heart disease Brother   . Hyperlipidemia Brother   . Mental illness Brother   . Mental illness Brother   . Hyperlipidemia Brother   . Drug abuse Brother   . Diabetes Brother   . Depression Brother   . COPD Brother   . Birth defects Brother   . Arthritis Brother    Social History   Socioeconomic History  . Marital status: Widowed    Spouse name: Not on file  . Number of  children: Not on file  . Years of education: Not on file  . Highest education level: Not on file  Occupational History  . Not on file  Social Needs  . Financial resource strain: Not on file  . Food insecurity:    Worry: Not on file    Inability: Not on file  . Transportation needs:    Medical: Not on file    Non-medical: Not on file  Tobacco Use  . Smoking status: Never Smoker  . Smokeless tobacco: Never Used  Substance and Sexual Activity  . Alcohol use: No  . Drug use: No  . Sexual activity: Not Currently  Lifestyle  . Physical activity:    Days per week: Not on file    Minutes per session: Not on file  . Stress: Not on file  Relationships  . Social connections:    Talks on phone: Not on file    Gets together: Not on file    Attends religious service: Not on file    Active member of club or organization: Not on file    Attends meetings of clubs or organizations: Not on file    Relationship status: Not on file  . Intimate partner violence:    Fear of current or ex partner: Not on file     Emotionally abused: Not on file    Physically abused: Not on file    Forced sexual activity: Not on file  Other Topics Concern  . Not on file  Social History Narrative   2 sons, 4 pregnancies    Brother is [name]    Never smoker exposed 2nd hand    No guns    Wears seat belt    Safe in relationship, widowed lives alone   2 year college ed    Current Meds  Medication Sig  . albuterol (PROVENTIL HFA;VENTOLIN HFA) 108 (90 Base) MCG/ACT inhaler Inhale into the lungs.  Marland Kitchen amLODipine (NORVASC) 2.5 MG tablet Take 2.5 mg by mouth daily.  Marland Kitchen Apple Cider Vinegar 500 MG TABS Take by mouth.  . budesonide-formoterol (SYMBICORT) 160-4.5 MCG/ACT inhaler Inhale into the lungs.  . budesonide-formoterol (SYMBICORT) 160-4.5 MCG/ACT inhaler Inhale 2 puffs into the lungs 2 (two) times daily.  . cetirizine (ZYRTEC) 10 MG tablet Take by mouth.  Marland Kitchen glucose blood (PRECISION QID TEST) test strip Check sugars 1-2 times daily. 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 hydrochlorothiazide (MICROZIDE) 12.5 MG capsule Take 12.5 mg by mouth 2 (two) times daily. QD to bid  . ipratropium (ATROVENT) 0.06 % nasal spray Place into the nose.  . Lancets Misc. (UNISTIK 2 NORMAL) MISC Use to monitor blood sugar  . nystatin cream (MYCOSTATIN) Apply bid to intertrigo.  Glory Rosebush DELICA LANCETS FINE MISC USE TO CHECK BLOOD SUGAR 1-2 TIMES DAILY  . pantoprazole (PROTONIX) 40 MG tablet Take 40 mg by mouth daily.   . sitaGLIPtin (JANUVIA) 25 MG tablet Take 25 mg by mouth daily.  Marland Kitchen triamcinolone cream (KENALOG) 0.1 % Apply topically.  Marland Kitchen VITAMIN D PO Take 5,000 Units by mouth daily.   . [DISCONTINUED] cyclobenzaprine (FLEXERIL) 10 MG tablet Take 0.5 tablets (5 mg total) by mouth 2 (two) times daily as needed for muscle spasms.  . [DISCONTINUED] hydrochlorothiazide (MICROZIDE) 12.5 MG capsule Take by mouth.  . [DISCONTINUED] meclizine (ANTIVERT) 25 MG tablet Take by mouth.  . [DISCONTINUED] Pitavastatin Calcium 1 MG TABS TAKE 1/2  TABLET EVERY OTHER DAY  . [DISCONTINUED] spironolactone (ALDACTONE) 100 MG tablet   . [DISCONTINUED]  Vitamin D, Ergocalciferol, (DRISDOL) 1.25 MG (50000 UT) CAPS capsule Take by mouth.   Allergies  Allergen Reactions  . Alendronate Other (See Comments)    Stabbing pain in breast  . Alprazolam Other (See Comments)    Made anxious  . Amlodipine Other (See Comments)    Hives, [place] out, Black spots on face, Did not keep blood pressure down.   . Atorvastatin Other (See Comments)  . Benzalkonium Chloride Other (See Comments)    Made skin blister   . Clindamycin Other (See Comments)    Pt states that pill that is blue and red she cannot take due feeling like she was on edge  . Clonazepam Other (See Comments)    Headache   . Clonidine Other (See Comments)    Hurt chest    . Escitalopram Oxalate Other (See Comments)    Made anxious    . Gabapentin Other (See Comments)    A bad headache    . Hydrocodone-Acetaminophen Other (See Comments)    Made pain worse  . Iodine Other (See Comments)    Allergic to shell fish and x-ray dye   . Labetalol Other (See Comments)    Felt like veins are on fire   . Levofloxacin Other (See Comments)    Made me shake  . Lisinopril-Hydrochlorothiazide Other (See Comments)    Was in a blue and pale color   . Livalo [Pitavastatin]     Rash and nausea   . Lorazepam Other (See Comments)    Formula changed   . Losartan Potassium Other (See Comments)    Blood pressure    . Losartan Potassium-Hctz Other (See Comments)    Could not feel arms  . Meloxicam Other (See Comments)    Fatal blood pressure problems will cause strokes   . Metformin Nausea And Vomiting  . Metoclopramide Other (See Comments)    No air was moving, anxious    . Oxcarbazepine Other (See Comments)    Felt like she was chocking    . Penicillins   . Potassium Citrate   . Pravastatin Other (See Comments)  . Rosuvastatin Other (See Comments)  . Simvastatin Other (See Comments)   . Sitagliptin Other (See Comments)    "Made toes feel weird"  . Sodium Citrate Other (See Comments)    Teeth broke off   . Spironolactone     Breast pain    . Sulfa Antibiotics Other (See Comments)   Recent Results (from the past 2160 hour(s))  Comprehensive metabolic panel     Status: Abnormal   Collection Time: 06/06/18 11:54 AM  Result Value Ref Range   Sodium 139 135 - 145 mEq/L   Potassium 4.1 3.5 - 5.1 mEq/L   Chloride 102 96 - 112 mEq/L   CO2 29 19 - 32 mEq/L   Glucose, Bld 105 (H) 70 - 99 mg/dL   BUN 8 6 - 23 mg/dL   Creatinine, Ser 0.83 0.40 - 1.20 mg/dL   Total Bilirubin 0.5 0.2 - 1.2 mg/dL   Alkaline Phosphatase 74 39 - 117 U/L   AST 22 0 - 37 U/L   ALT 16 0 - 35 U/L   Total Protein 8.1 6.0 - 8.3 g/dL   Albumin 4.3 3.5 - 5.2 g/dL   Calcium 9.8 8.4 - 10.5 mg/dL   GFR 87.05 >60.00 mL/min  CBC with Differential/Platelet     Status: Abnormal   Collection Time: 06/06/18 11:54 AM  Result Value Ref Range   WBC  4.0 4.0 - 10.5 K/uL   RBC 5.14 (H) 3.87 - 5.11 Mil/uL   Hemoglobin 15.3 (H) 12.0 - 15.0 g/dL   HCT 45.6 36.0 - 46.0 %   MCV 88.6 78.0 - 100.0 fl   MCHC 33.6 30.0 - 36.0 g/dL   RDW 13.5 11.5 - 15.5 %   Platelets 244.0 150.0 - 400.0 K/uL   Neutrophils Relative % 55.1 43.0 - 77.0 %   Lymphocytes Relative 31.7 12.0 - 46.0 %   Monocytes Relative 9.5 3.0 - 12.0 %   Eosinophils Relative 2.6 0.0 - 5.0 %   Basophils Relative 1.1 0.0 - 3.0 %   Neutro Abs 2.2 1.4 - 7.7 K/uL   Lymphs Abs 1.3 0.7 - 4.0 K/uL   Monocytes Absolute 0.4 0.1 - 1.0 K/uL   Eosinophils Absolute 0.1 0.0 - 0.7 K/uL   Basophils Absolute 0.0 0.0 - 0.1 K/uL  Lipid panel     Status: Abnormal   Collection Time: 06/06/18 11:54 AM  Result Value Ref Range   Cholesterol 272 (H) 0 - 200 mg/dL    Comment: ATP III Classification       Desirable:  < 200 mg/dL               Borderline High:  200 - 239 mg/dL          High:  > = 240 mg/dL   Triglycerides 130.0 0.0 - 149.0 mg/dL    Comment: Normal:  <150  mg/dLBorderline High:  150 - 199 mg/dL   HDL 55.30 >39.00 mg/dL   VLDL 26.0 0.0 - 40.0 mg/dL   LDL Cholesterol 190 (H) 0 - 99 mg/dL   Total CHOL/HDL Ratio 5     Comment:                Men          Women1/2 Average Risk     3.4          3.3Average Risk          5.0          4.42X Average Risk          9.6          7.13X Average Risk          15.0          11.0                       NonHDL 216.29     Comment: NOTE:  Non-HDL goal should be 30 mg/dL higher than patient's LDL goal (i.e. LDL goal of < 70 mg/dL, would have non-HDL goal of < 100 mg/dL)  Hemoglobin A1c     Status: None   Collection Time: 06/06/18 11:54 AM  Result Value Ref Range   Hgb A1c MFr Bld 6.5 4.6 - 6.5 %    Comment: Glycemic Control Guidelines for People with Diabetes:Non Diabetic:  <6%Goal of Therapy: <7%Additional Action Suggested:  >8%   Urinalysis, Routine w reflex microscopic     Status: Abnormal   Collection Time: 06/06/18 11:54 AM  Result Value Ref Range   Color, Urine YELLOW YELLOW   APPearance CLEAR CLEAR   Specific Gravity, Urine 1.010 1.001 - 1.03   pH > OR = 8.5 (A) 5.0 - 8.0   Glucose, UA NEGATIVE NEGATIVE   Bilirubin Urine NEGATIVE NEGATIVE   Ketones, ur NEGATIVE NEGATIVE   Hgb urine dipstick NEGATIVE NEGATIVE   Protein, ur NEGATIVE  NEGATIVE   Nitrite NEGATIVE NEGATIVE   Leukocytes, UA NEGATIVE NEGATIVE  Urine Microalbumin w/creat. ratio     Status: None   Collection Time: 06/06/18 11:54 AM  Result Value Ref Range   Creatinine, Urine 57 20 - 275 mg/dL   Microalb, Ur 1.4 mg/dL    Comment: Reference Range Not established    Microalb Creat Ratio 25 <30 mcg/mg creat    Comment: . The ADA defines abnormalities in albumin excretion as follows: Marland Kitchen Category         Result (mcg/mg creatinine) . Normal                    <30 Microalbuminuria         30-299  Clinical albuminuria   > OR = 300 . The ADA recommends that at least two of three specimens collected within a 3-6 month period be abnormal  before considering a patient to be within a diagnostic category.   T4, free     Status: None   Collection Time: 06/06/18 11:54 AM  Result Value Ref Range   Free T4 0.80 0.60 - 1.60 ng/dL    Comment: Specimens from patients who are undergoing biotin therapy and /or ingesting biotin supplements may contain high levels of biotin.  The higher biotin concentration in these specimens interferes with this Free T4 assay.  Specimens that contain high levels  of biotin may cause false high results for this Free T4 assay.  Please interpret results in light of the total clinical presentation of the patient.    TSH     Status: None   Collection Time: 06/06/18 11:54 AM  Result Value Ref Range   TSH 0.86 0.35 - 4.50 uIU/mL  Uric acid     Status: None   Collection Time: 06/06/18 11:54 AM  Result Value Ref Range   Uric Acid, Serum 4.1 2.4 - 7.0 mg/dL   Objective  Body mass index is 31.95 kg/m. Wt Readings from Last 3 Encounters:  06/06/18 192 lb (87.1 kg)   Temp Readings from Last 3 Encounters:  06/06/18 98.6 F (37 C) (Oral)  09/26/12 98.2 F (36.8 C) (Oral)   BP Readings from Last 3 Encounters:  06/06/18 136/90  09/26/12 139/94   Pulse Readings from Last 3 Encounters:  06/06/18 78  09/26/12 70    Physical Exam  Constitutional: She is oriented to person, place, and time. Vital signs are normal. She appears well-developed and well-nourished. She is cooperative.  HENT:  Head: Normocephalic and atraumatic.  Mouth/Throat: Oropharynx is clear and moist and mucous membranes are normal.  Eyes: Pupils are equal, round, and reactive to light. Conjunctivae are normal.  Cardiovascular: Normal rate, regular rhythm and normal heart sounds.  Pulmonary/Chest: Effort normal and breath sounds normal.  Neurological: She is alert and oriented to person, place, and time. Gait normal.  Skin: Skin is warm, dry and intact.  Psychiatric: She has a normal mood and affect. Her speech is normal and behavior  is normal. Judgment and thought content normal. Cognition and memory are normal.  Nursing note and vitals reviewed.   Assessment   1. Left toe pain intermittent mild worse with the cold weather Xray +moderate OA left MTP 2. L ear pain radiates to left neck ? Etiology no cerumen in b/l ears +fluid level could be related allergies eustachian tube dysfunction 3. DM 2 A1C 6.2 10/05/17  4. HTN  5. HLD intolerant to statins even livalo>nausea and rash  6. History  of chest pain  7. H/o thyroid goiter MNG 8. H/o atypical infection CXR 11/2017 and h/o asthma cxr peribronchial thickening could be acute or chronic bronchitis  9. HM Plan  1. Xray left foot today + arthritis  Check uric acid r/o gout  2. Normal ear exam except for fluid  Consider flonase  If sx's cont consider CT neck soft tissue 3. Fasting labs today cMET, CBC lipid,A1C, UA urine protein, TSH, T4  Continue meds januvia on 25 mg qd  Eye exam and foot address at f/u  4. Cont meds  hctz 12.5 qd to bid per pt  5.  Stop livalo 1 mg caused sx's start zetia  If this does not work consider repatha  6. Ordered echo, CXR today  Consider cardiology referral in the future for cardiac CT scoring  Get records of prior stress test from PCP  7.   Check thyroid labs  Thyroid US  8.  Repeat CXR today for clearance  Cont inhalers  9.  Fasting labs today   Declines flu shot  utd prevnar and pna 23  Check date of Tdap  Consider shingrix disc in future   LMP 07/1997 pap 12/09/14 neg pap neg HPV s/p hysterectomy no h/o abnormal pap 1 ovary intact ? Which one Mammogram referred today dx and [place] for right breast pain  Colonoscopy 05/18/16 tubular adenoma Dr. Jerene Pitch [facility] f/u in 5 years and FH colon cancer in dad DEXA 05/31/16 osteopenia consider repeat in 3-5 years vit D normal  Hep C neg 07/07/17  Never smoker h/o 2nd hand exposure   Prior PCP Dr. Cruzita Lederer in [place] requested records   Provider: Dr. Olivia Mackie  [facility]

## 2018-06-11 NOTE — Progress Notes (Signed)
TDAP not in ncir

## 2018-06-14 ENCOUNTER — Ambulatory Visit
Admission: RE | Admit: 2018-06-14 | Discharge: 2018-06-14 | Disposition: A | Discharge status: Home / Self Care | Payer: Medicare HMO | Source: Ambulatory Visit | Attending: Internal Medicine | Admitting: Internal Medicine

## 2018-06-14 ENCOUNTER — Other Ambulatory Visit: Payer: Medicare HMO

## 2018-06-14 DIAGNOSIS — R928 Other abnormal and inconclusive findings on diagnostic imaging of breast: Secondary | ICD-10-CM | POA: Diagnosis not present

## 2018-06-14 DIAGNOSIS — E042 Nontoxic multinodular goiter: Secondary | ICD-10-CM

## 2018-06-14 DIAGNOSIS — N644 Mastodynia: Secondary | ICD-10-CM

## 2018-06-14 DIAGNOSIS — N6011 Diffuse cystic mastopathy of right breast: Secondary | ICD-10-CM | POA: Diagnosis not present

## 2018-06-20 ENCOUNTER — Ambulatory Visit
Admission: RE | Admit: 2018-06-20 | Discharge: 2018-06-20 | Disposition: A | Discharge status: Home / Self Care | Payer: Medicare HMO | Source: Ambulatory Visit | Attending: Internal Medicine | Admitting: Internal Medicine

## 2018-06-20 DIAGNOSIS — J45909 Unspecified asthma, uncomplicated: Secondary | ICD-10-CM | POA: Insufficient documentation

## 2018-06-20 DIAGNOSIS — E785 Hyperlipidemia, unspecified: Secondary | ICD-10-CM | POA: Insufficient documentation

## 2018-06-20 DIAGNOSIS — E119 Type 2 diabetes mellitus without complications: Secondary | ICD-10-CM | POA: Insufficient documentation

## 2018-06-20 DIAGNOSIS — R079 Chest pain, unspecified: Secondary | ICD-10-CM | POA: Insufficient documentation

## 2018-06-20 DIAGNOSIS — I10 Essential (primary) hypertension: Secondary | ICD-10-CM | POA: Insufficient documentation

## 2018-06-20 DIAGNOSIS — Z8249 Family history of ischemic heart disease and other diseases of the circulatory system: Secondary | ICD-10-CM

## 2018-06-20 DIAGNOSIS — G43909 Migraine, unspecified, not intractable, without status migrainosus: Secondary | ICD-10-CM | POA: Insufficient documentation

## 2018-06-20 DIAGNOSIS — Z87898 Personal history of other specified conditions: Secondary | ICD-10-CM | POA: Diagnosis not present

## 2018-06-20 DIAGNOSIS — K219 Gastro-esophageal reflux disease without esophagitis: Secondary | ICD-10-CM | POA: Insufficient documentation

## 2018-06-20 DIAGNOSIS — I071 Rheumatic tricuspid insufficiency: Secondary | ICD-10-CM | POA: Diagnosis not present

## 2018-06-20 DIAGNOSIS — E079 Disorder of thyroid, unspecified: Secondary | ICD-10-CM | POA: Insufficient documentation

## 2018-06-20 NOTE — Progress Notes (Signed)
*  PRELIMINARY RESULTS* Echocardiogram 2D Echocardiogram has been performed.  Sherrie Sport 06/20/2018, 11:36 AM

## 2018-07-04 ENCOUNTER — Ambulatory Visit: Payer: Medicare HMO | Admitting: Internal Medicine

## 2018-07-05 ENCOUNTER — Encounter: Payer: Self-pay | Admitting: Internal Medicine

## 2018-07-05 ENCOUNTER — Ambulatory Visit (INDEPENDENT_AMBULATORY_CARE_PROVIDER_SITE_OTHER): Payer: Medicare HMO | Admitting: Internal Medicine

## 2018-07-05 VITALS — BP 146/82 | HR 78 | Temp 97.9°F | Ht 65.0 in | Wt 192.8 lb

## 2018-07-05 DIAGNOSIS — E041 Nontoxic single thyroid nodule: Secondary | ICD-10-CM

## 2018-07-05 DIAGNOSIS — I10 Essential (primary) hypertension: Secondary | ICD-10-CM

## 2018-07-05 DIAGNOSIS — R079 Chest pain, unspecified: Secondary | ICD-10-CM

## 2018-07-05 DIAGNOSIS — E785 Hyperlipidemia, unspecified: Secondary | ICD-10-CM

## 2018-07-05 DIAGNOSIS — E042 Nontoxic multinodular goiter: Secondary | ICD-10-CM

## 2018-07-05 MED ORDER — AMLODIPINE BESYLATE 5 MG PO TABS: 5.0000 mg | ORAL_TABLET | Freq: Every day | ORAL | 3 refills | Status: DC

## 2018-07-05 NOTE — Progress Notes (Signed)
tdap not in [place]

## 2018-07-05 NOTE — Progress Notes (Signed)
Pre visit review using our clinic review tool, if applicable. No additional management support is needed unless otherwise documented below in the visit note. 

## 2018-07-05 NOTE — Progress Notes (Signed)
Chief Complaint  Patient presents with  . Follow-up   F/u  1. HTN uncontrolled on norvasc 2.5 and hctz 12.5 mg bid can [facility]  2. HLD unable to tolerate zetia felt like when she was taking statins agreeable to restart livalo 1 mg qhs which made her feel nauseated  3. Thyroid nodules left 1 needs to be bx'ed will refer endocrine    Review of Systems  Constitutional: Negative for weight loss.  HENT: Negative for hearing loss.   Eyes: Negative for blurred vision.  Respiratory: Negative for shortness of breath.   Cardiovascular: Negative for chest pain.  Gastrointestinal: Negative for nausea.  Skin: Negative for rash.  Neurological: Negative for headaches.  Psychiatric/Behavioral: Negative for depression.   Past Medical History:  Diagnosis Date  . Allergy   . 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 Asthma    with h/o chronic bronchitis  . Back pain    mid back pain h/ o trauma   . Diabetes mellitus without complication ([place])   . Frequent headaches   . Gastric ulcer   . GERD (gastroesophageal reflux disease)   . History of chicken pox   . Hyperlipidemia   . Hypertension   . Migraines   . Thyroid disease    mng    Past Surgical History:  Procedure Laterality Date  . ABDOMINAL HYSTERECTOMY     DUB 1 ovary intact    Family History  Problem Relation Age of Onset  . Alcohol abuse Mother   . Heart disease Mother        CHF  . Hypertension Mother   . Hyperlipidemia Mother   . Stroke Mother   . Mental illness Mother   . Alcohol abuse Father   . Cancer Father        colon   . Arthritis Sister   . COPD Sister   . Depression Sister   . Drug abuse Sister   . Hearing loss Sister   . Heart disease Sister        CHF  . Hyperlipidemia Sister   . Hypertension Sister   . Mental illness Sister   . COPD Brother   . Depression Brother   . Heart disease Brother   . Hyperlipidemia Brother   . Stroke Brother   . Asthma Son   . Arthritis  Brother   . Depression Brother   . Heart disease Brother   . Hyperlipidemia Brother   . Mental illness Brother   . Mental illness Brother   . Hyperlipidemia Brother   . Drug abuse Brother   . Diabetes Brother   . Depression Brother   . COPD Brother   . Birth defects Brother   . Arthritis Brother    Social History   Socioeconomic History  . Marital status: Widowed    Spouse name: Not on file  . Number of children: Not on file  . Years of education: Not on file  . Highest education level: Not on file  Occupational History  . Not on file  Social Needs  . Financial resource strain: Not on file  . Food insecurity:    Worry: Not on file    Inability: Not on file  . Transportation needs:    Medical: Not on file    Non-medical: Not on file  Tobacco Use  . Smoking status: Never Smoker  . Smokeless tobacco: Never Used  Substance and Sexual Activity  .  Alcohol use: No  . Drug use: No  . Sexual activity: Not Currently  Lifestyle  . Physical activity:    Days per week: Not on file    Minutes per session: Not on file  . Stress: Not on file  Relationships  . Social connections:    Talks on phone: Not on file    Gets together: Not on file    Attends religious service: Not on file    Active member of club or organization: Not on file    Attends meetings of clubs or organizations: Not on file    Relationship status: Not on file  . Intimate partner violence:    Fear of current or ex partner: Not on file    Emotionally abused: Not on file    Physically abused: Not on file    Forced sexual activity: Not on file  Other Topics Concern  . Not on file  Social History Narrative   2 sons, 4 pregnancies    Brother is [name]    Never smoker exposed 2nd hand    No guns    Wears seat belt    Safe in relationship, widowed lives alone   2 year college ed    Current Meds  Medication Sig  . albuterol (PROVENTIL HFA;VENTOLIN HFA) 108 (90 Base) MCG/ACT inhaler Inhale into the  lungs.  Marland Kitchen amLODipine (NORVASC) 2.5 MG tablet Take 2.5 mg by mouth daily.  Marland Kitchen Apple Cider Vinegar 500 MG TABS Take by mouth.  . budesonide-formoterol (SYMBICORT) 160-4.5 MCG/ACT inhaler Inhale into the lungs.  . budesonide-formoterol (SYMBICORT) 160-4.5 MCG/ACT inhaler Inhale 2 puffs into the lungs 2 (two) times daily.  . cetirizine (ZYRTEC) 10 MG tablet Take by mouth.  Marland Kitchen glucose blood (PRECISION QID TEST) test strip Check sugars 1-2 times daily. 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 hydrochlorothiazide (MICROZIDE) 12.5 MG capsule Take 12.5 mg by mouth 2 (two) times daily. QD to bid  . ipratropium (ATROVENT) 0.06 % nasal spray Place into the nose.  . Lancets Misc. (UNISTIK 2 NORMAL) MISC Use to monitor blood sugar  . LIVALO 1 MG TABS   . nystatin cream (MYCOSTATIN) Apply bid to intertrigo.  Glory Rosebush DELICA LANCETS FINE MISC USE TO CHECK BLOOD SUGAR 1-2 TIMES DAILY  . pantoprazole (PROTONIX) 40 MG tablet Take 40 mg by mouth daily.   . sitaGLIPtin (JANUVIA) 25 MG tablet Take 25 mg by mouth daily.  Marland Kitchen triamcinolone cream (KENALOG) 0.1 % Apply topically.  Marland Kitchen VITAMIN D PO Take 5,000 Units by mouth daily.   . [DISCONTINUED] ezetimibe (ZETIA) 10 MG tablet TAKE 1 TABLET BY MOUTH DAILY   Allergies  Allergen Reactions  . Alendronate Other (See Comments)    Stabbing pain in breast  . Alprazolam Other (See Comments)    Made anxious  . Amlodipine Other (See Comments)    Hives, [place] out, Black spots on face, Did not keep blood pressure down.   . Atorvastatin Other (See Comments)  . Benzalkonium Chloride Other (See Comments)    Made skin blister   . Clindamycin Other (See Comments)    Pt states that pill that is blue and red she cannot take due feeling like she was on edge  . Clonazepam Other (See Comments)    Headache   . Clonidine Other (See Comments)    Hurt chest    . Escitalopram Oxalate Other (See Comments)    Made anxious    . Gabapentin Other (See Comments)    A bad  headache    .  Hydrocodone-Acetaminophen Other (See Comments)    Made pain worse  . Iodine Other (See Comments)    Allergic to shell fish and x-ray dye   . Labetalol Other (See Comments)    Felt like veins are on fire   . Levofloxacin Other (See Comments)    Made me shake  . Lisinopril-Hydrochlorothiazide Other (See Comments)    Was in a blue and pale color   . Livalo [Pitavastatin]     Rash and nausea   . Lorazepam Other (See Comments)    Formula changed   . Losartan Potassium Other (See Comments)    Blood pressure    . Losartan Potassium-Hctz Other (See Comments)    Could not feel arms  . Meloxicam Other (See Comments)    Fatal blood pressure problems will cause strokes   . Metformin Nausea And Vomiting  . Metoclopramide Other (See Comments)    No air was moving, anxious    . Oxcarbazepine Other (See Comments)    Felt like she was chocking    . Penicillins   . Potassium Citrate   . Pravastatin Other (See Comments)  . Rosuvastatin Other (See Comments)  . Simvastatin Other (See Comments)  . Sitagliptin Other (See Comments)    "Made toes feel weird"  . Sodium Citrate Other (See Comments)    Teeth broke off   . Spironolactone     Breast pain    . Sulfa Antibiotics Other (See Comments)   Recent Results (from the past 2160 hour(s))  Comprehensive metabolic panel     Status: Abnormal   Collection Time: 06/06/18 11:54 AM  Result Value Ref Range   Sodium 139 135 - 145 mEq/L   Potassium 4.1 3.5 - 5.1 mEq/L   Chloride 102 96 - 112 mEq/L   CO2 29 19 - 32 mEq/L   Glucose, Bld 105 (H) 70 - 99 mg/dL   BUN 8 6 - 23 mg/dL   Creatinine, Ser 0.83 0.40 - 1.20 mg/dL   Total Bilirubin 0.5 0.2 - 1.2 mg/dL   Alkaline Phosphatase 74 39 - 117 U/L   AST 22 0 - 37 U/L   ALT 16 0 - 35 U/L   Total Protein 8.1 6.0 - 8.3 g/dL   Albumin 4.3 3.5 - 5.2 g/dL   Calcium 9.8 8.4 - 10.5 mg/dL   GFR 87.05 >60.00 mL/min  CBC with Differential/Platelet     Status: Abnormal   Collection Time: 06/06/18  11:54 AM  Result Value Ref Range   WBC 4.0 4.0 - 10.5 K/uL   RBC 5.14 (H) 3.87 - 5.11 Mil/uL   Hemoglobin 15.3 (H) 12.0 - 15.0 g/dL   HCT 45.6 36.0 - 46.0 %   MCV 88.6 78.0 - 100.0 fl   MCHC 33.6 30.0 - 36.0 g/dL   RDW 13.5 11.5 - 15.5 %   Platelets 244.0 150.0 - 400.0 K/uL   Neutrophils Relative % 55.1 43.0 - 77.0 %   Lymphocytes Relative 31.7 12.0 - 46.0 %   Monocytes Relative 9.5 3.0 - 12.0 %   Eosinophils Relative 2.6 0.0 - 5.0 %   Basophils Relative 1.1 0.0 - 3.0 %   Neutro Abs 2.2 1.4 - 7.7 K/uL   Lymphs Abs 1.3 0.7 - 4.0 K/uL   Monocytes Absolute 0.4 0.1 - 1.0 K/uL   Eosinophils Absolute 0.1 0.0 - 0.7 K/uL   Basophils Absolute 0.0 0.0 - 0.1 K/uL  Lipid panel     Status:  Abnormal   Collection Time: 06/06/18 11:54 AM  Result Value Ref Range   Cholesterol 272 (H) 0 - 200 mg/dL    Comment: ATP III Classification       Desirable:  < 200 mg/dL               Borderline High:  200 - 239 mg/dL          High:  > = 240 mg/dL   Triglycerides 130.0 0.0 - 149.0 mg/dL    Comment: Normal:  <150 mg/dLBorderline High:  150 - 199 mg/dL   HDL 55.30 >39.00 mg/dL   VLDL 26.0 0.0 - 40.0 mg/dL   LDL Cholesterol 190 (H) 0 - 99 mg/dL   Total CHOL/HDL Ratio 5     Comment:                Men          Women1/2 Average Risk     3.4          3.3Average Risk          5.0          4.42X Average Risk          9.6          7.13X Average Risk          15.0          11.0                       NonHDL 216.29     Comment: NOTE:  Non-HDL goal should be 30 mg/dL higher than patient's LDL goal (i.e. LDL goal of < 70 mg/dL, would have non-HDL goal of < 100 mg/dL)  Hemoglobin A1c     Status: None   Collection Time: 06/06/18 11:54 AM  Result Value Ref Range   Hgb A1c MFr Bld 6.5 4.6 - 6.5 %    Comment: Glycemic Control Guidelines for People with Diabetes:Non Diabetic:  <6%Goal of Therapy: <7%Additional Action Suggested:  >8%   Urinalysis, Routine w reflex microscopic     Status: Abnormal   Collection Time: 06/06/18  11:54 AM  Result Value Ref Range   Color, Urine YELLOW YELLOW   APPearance CLEAR CLEAR   Specific Gravity, Urine 1.010 1.001 - 1.03   pH > OR = 8.5 (A) 5.0 - 8.0   Glucose, UA NEGATIVE NEGATIVE   Bilirubin Urine NEGATIVE NEGATIVE   Ketones, ur NEGATIVE NEGATIVE   Hgb urine dipstick NEGATIVE NEGATIVE   Protein, ur NEGATIVE NEGATIVE   Nitrite NEGATIVE NEGATIVE   Leukocytes, UA NEGATIVE NEGATIVE  Urine Microalbumin w/creat. ratio     Status: None   Collection Time: 06/06/18 11:54 AM  Result Value Ref Range   Creatinine, Urine 57 20 - 275 mg/dL   Microalb, Ur 1.4 mg/dL    Comment: Reference Range Not established    Microalb Creat Ratio 25 <30 mcg/mg creat    Comment: . The ADA defines abnormalities in albumin excretion as follows: Marland Kitchen Category         Result (mcg/mg creatinine) . Normal                    <30 Microalbuminuria         30-299  Clinical albuminuria   > OR = 300 . The ADA recommends that at least two of three specimens collected within a 3-6 month period be abnormal before considering a patient to be within  a diagnostic category.   T4, free     Status: None   Collection Time: 06/06/18 11:54 AM  Result Value Ref Range   Free T4 0.80 0.60 - 1.60 ng/dL    Comment: Specimens from patients who are undergoing biotin therapy and /or ingesting biotin supplements may contain high levels of biotin.  The higher biotin concentration in these specimens interferes with this Free T4 assay.  Specimens that contain high levels  of biotin may cause false high results for this Free T4 assay.  Please interpret results in light of the total clinical presentation of the patient.    TSH     Status: None   Collection Time: 06/06/18 11:54 AM  Result Value Ref Range   TSH 0.86 0.35 - 4.50 uIU/mL  Uric acid     Status: None   Collection Time: 06/06/18 11:54 AM  Result Value Ref Range   Uric Acid, Serum 4.1 2.4 - 7.0 mg/dL   Objective  Body mass index is 32.08 kg/m. Wt Readings  from Last 3 Encounters:  07/05/18 192 lb 12.8 oz (87.5 kg)  06/06/18 192 lb (87.1 kg)   Temp Readings from Last 3 Encounters:  07/05/18 97.9 F (36.6 C) (Oral)  06/06/18 98.6 F (37 C) (Oral)  09/26/12 98.2 F (36.8 C) (Oral)   BP Readings from Last 3 Encounters:  07/05/18 (!) 146/82  06/06/18 136/90  09/26/12 139/94   Pulse Readings from Last 3 Encounters:  07/05/18 78  06/06/18 78  09/26/12 70    Physical Exam  Constitutional: She is oriented to person, place, and time. Vital signs are normal. She appears well-developed and well-nourished. She is cooperative.  HENT:  Head: Normocephalic and atraumatic.  Mouth/Throat: Oropharynx is clear and moist and mucous membranes are normal.  Eyes: Pupils are equal, round, and reactive to light. Conjunctivae are normal.  Cardiovascular: Normal rate, regular rhythm and normal heart sounds.  Pulmonary/Chest: Effort normal and breath sounds normal.  Neurological: She is alert and oriented to person, place, and time. Gait normal.  Skin: Skin is warm, dry and intact.  Psychiatric: She has a normal mood and affect. Her speech is normal and behavior is normal. Judgment and thought content normal. Cognition and memory are normal.  Nursing note and vitals reviewed.   Assessment   1. HTN/HLD  2. Thyroid nodule left  3. HM Plan   1. Increase norvasc 2.5 to 5 mg tolerating 2.5 [facility] pt  Cont hctz 12.5 mg bid consider change to chlorthalidone 25 qd if bp still elevated at f/u  Refer to Dr. Daneen Schick denies CP now h/o intermittent CP and consider repatha injections HLD  Unable to tolerate several statins 2. Refer to Dr. Elyse Hsu in [place] endocrine  3.  Declines flu shot  utd prevnar and pna 23  Check date of Tdap  Consider shingrix disc in future   LMP 07/1997 pap 12/09/14 neg pap neg HPV s/p hysterectomy no h/o abnormal pap 1 ovary intact ? Which one Mammogram fat necrosis right breast 06/14/18  Colonoscopy 05/18/16 tubular  adenoma Dr. Jerene Pitch GI f/u in 5 years and FH colon cancer in dad DEXA 05/31/16 osteopenia consider repeat in 3-5 years vit D normal 85.5 07/07/17  Hep C neg 07/07/17  Never smoker h/o 2nd hand exposure  Provider: Dr. Olivia Mackie McLean-Scocuzza-[facility]

## 2018-07-05 NOTE — Patient Instructions (Addendum)
Increase Amlodipine/norvasc to 5 mg daily  Take livalo 1 mg at night    Cholesterol Cholesterol is a white, waxy, fat-like substance that is needed by the human body in small amounts. The liver makes all the cholesterol we need. Cholesterol is carried from the liver by the blood through the blood vessels. Deposits of cholesterol (plaques) may build up on blood vessel (artery) walls. Plaques make the arteries narrower and stiffer. Cholesterol plaques increase the risk for heart attack and stroke. You cannot feel your cholesterol level even if it is very high. The only way to know that it is high is to have a blood test. Once you know your cholesterol levels, you should keep a record of the test results. Work with your health care provider to keep your levels in the desired range. What do the results mean?  Total cholesterol is a rough measure of all the cholesterol in your blood.  LDL (low-density lipoprotein) is the "bad" cholesterol. This is the type that causes plaque to build up on the artery walls. You want this level to be low.  HDL (high-density lipoprotein) is the "good" cholesterol because it cleans the arteries and carries the LDL away. You want this level to be high.  Triglycerides are fat that the body can either burn for energy or store. High levels are closely linked to heart disease. What are the desired levels of cholesterol?  Total cholesterol below 200.  LDL below 100 for people who are at risk, below 70 for people at very high risk.  HDL above 40 is good. A level of 60 or higher is considered to be protective against heart disease.  Triglycerides below 150. How can I lower my cholesterol? Diet Follow your diet program as told by your health care provider.  Choose fish or white meat chicken and [place], roasted or baked. Limit fatty cuts of red meat, fried foods, and processed meats, such as sausage and lunch meats.  Eat lots of fresh fruits and vegetables.  Choose  whole grains, beans, pasta, potatoes, and cereals.  Choose olive oil, corn oil, or canola oil, and use only small amounts.  Avoid butter, mayonnaise, shortening, or palm kernel oils.  Avoid foods with trans fats.  Drink skim or nonfat milk and eat low-fat or nonfat yogurt and cheeses. Avoid whole milk, cream, ice cream, egg yolks, and full-fat cheeses.  Healthier desserts include angel food cake, ginger snaps, animal crackers, hard candy, popsicles, and low-fat or nonfat frozen yogurt. Avoid pastries, cakes, pies, and cookies.  Exercise  Follow your exercise program as told by your health care provider. A regular program: ? Helps to decrease LDL and raise HDL. ? Helps with weight control.  Do things that increase your activity level, such as gardening, walking, and taking the stairs.  Ask your health care provider about ways that you can be more active in your daily life.  Medicine  Take over-the-counter and prescription medicines only as told by your health care provider. ? Medicine may be prescribed by your health care provider to help lower cholesterol and decrease the risk for heart disease. This is usually done if diet and exercise have failed to bring down cholesterol levels. ? If you have several risk factors, you may need medicine even if your levels are normal.  This information is not intended to replace advice given to you by your health care provider. Make sure you discuss any questions you have with your health care provider. Document Released: 04/12/2001  Document Revised: 02/13/2016 Document Reviewed: 01/16/2016 Elsevier Interactive Patient Education  [employer].

## 2018-09-09 NOTE — Progress Notes (Signed)
Cardiology Office Note:    Date:  09/10/2018   ID:  Marissa King, DOB [birth year], MRN [id number]  PCP:  McLean-Scocuzza, Nino Glow, MD  Cardiologist:  No primary care provider on file.   Referring MD: McLean-Scocuzza, Olivia Mackie *   Chief Complaint  Patient presents with  . Advice Only    CV risk assessment  . Hyperlipidemia  . Hypertension    History of Present Illness:    Marissa King is a 73 y.o. female with a hx of chest pain referred for consultation concerning risk assessment and modification.  She is referred by Dr. Karlyn Agee- Scocuzza.  It is difficult to connect with this patient from a historical standpoint.  She focuses on pain that she has in a focal area in her lateral orbital region.  Also is concerned about pain in her right foot.  When we discussed the reason for referral she seemed confused about why she is here.  She has no specific cardiac complaints.  States that she has had prior work-up including an abnormal stress test leading to heart catheterization in 2007.  Cannot tell me any results and I am unable to find any data in Care Everywhere.  The patient denies chest pain.  She is moderately sedentary.  She does not exercise as she used to.  There is some dyspnea on exertion.  She is not limited in her quality of life with reference to activity.  She does not sleep well.  States that she does not go to bed until late at night.  She awakens feeling tired all the time.  Her husband is deceased.  The patient denies snoring.  Risk factors include positive family history (sister with CVA, brother with CVA, mother died of CVA), diabetes mellitus, hypertension, physical inactivity, and severe hyperlipidemia.  She has a prior smoker but has long-term secondhand smoke exposure.  There is an extremely long list of medication intolerances that includes almost every statin and antihypertensive including losartan lisinopril HCT labetalol clonidine amlodipine and spironolactone.   Previously on simvastatin, rosuvastatin, pravastatin.   Past Medical History:  Diagnosis Date  . Allergy   . 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 Asthma    with h/o chronic bronchitis  . Back pain    mid back pain h/ o trauma   . Diabetes mellitus without complication ([place])   . Frequent headaches   . Gastric ulcer   . GERD (gastroesophageal reflux disease)   . History of chicken pox   . Hyperlipidemia   . Hypertension   . Migraines   . Thyroid disease    mng     Past Surgical History:  Procedure Laterality Date  . ABDOMINAL HYSTERECTOMY     DUB 1 ovary intact     Current Medications: Current Meds  Medication Sig  . albuterol (PROVENTIL HFA;VENTOLIN HFA) 108 (90 Base) MCG/ACT inhaler Inhale into the lungs.  Marland Kitchen amLODipine (NORVASC) 5 MG tablet Take 5 mg by mouth daily. 1 1/2 every other day.  Marland Kitchen Apple Cider Vinegar 500 MG TABS Take by mouth.  . budesonide-formoterol (SYMBICORT) 160-4.5 MCG/ACT inhaler Inhale into the lungs.  . budesonide-formoterol (SYMBICORT) 160-4.5 MCG/ACT inhaler Inhale 2 puffs into the lungs 2 (two) times daily.  . cetirizine (ZYRTEC) 10 MG tablet Take by mouth.  Marland Kitchen glucose blood (PRECISION QID TEST) test strip Check sugars 1-2 times daily. 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 hydrochlorothiazide (MICROZIDE) 12.5 MG capsule Take 12.5 mg by mouth as  needed (Blood pressure).   Marland Kitchen ipratropium (ATROVENT) 0.06 % nasal spray Place into the nose.  . Lancets Misc. (UNISTIK 2 NORMAL) MISC Use to monitor blood sugar  . LIVALO 1 MG TABS   . nystatin cream (MYCOSTATIN) Apply bid to intertrigo.  Glory Rosebush DELICA LANCETS FINE MISC USE TO CHECK BLOOD SUGAR 1-2 TIMES DAILY  . pantoprazole (PROTONIX) 40 MG tablet Take 40 mg by mouth daily.   . sitaGLIPtin (JANUVIA) 25 MG tablet Take 25 mg by mouth daily.  Marland Kitchen triamcinolone cream (KENALOG) 0.1 % Apply topically.  Marland Kitchen VITAMIN D PO Take 5,000 Units by mouth daily.      Allergies:   Alendronate; Alprazolam;  Amlodipine; Atorvastatin; Benzalkonium chloride; Clindamycin; Clonazepam; Clonidine; Escitalopram oxalate; Gabapentin; Hydrocodone-acetaminophen; Iodine; Labetalol; Levofloxacin; Lisinopril-hydrochlorothiazide; Livalo [pitavastatin]; Lorazepam; Losartan potassium; Losartan potassium-hctz; Meloxicam; Metformin; Metoclopramide; Oxcarbazepine; Penicillins; Potassium citrate; Pravastatin; Rosuvastatin; Simvastatin; Sitagliptin; Sodium citrate; Spironolactone; Sulfa antibiotics; and Zetia [ezetimibe]   Social History   Socioeconomic History  . Marital status: Widowed    Spouse name: Not on file  . Number of children: Not on file  . Years of education: Not on file  . Highest education level: Not on file  Occupational History  . Not on file  Social Needs  . Financial resource strain: Not on file  . Food insecurity:    Worry: Not on file    Inability: Not on file  . Transportation needs:    Medical: Not on file    Non-medical: Not on file  Tobacco Use  . Smoking status: Never Smoker  . Smokeless tobacco: Never Used  Substance and Sexual Activity  . Alcohol use: No  . Drug use: No  . Sexual activity: Not Currently  Lifestyle  . Physical activity:    Days per week: Not on file    Minutes per session: Not on file  . Stress: Not on file  Relationships  . Social connections:    Talks on phone: Not on file    Gets together: Not on file    Attends religious service: Not on file    Active member of club or organization: Not on file    Attends meetings of clubs or organizations: Not on file    Relationship status: Not on file  Other Topics Concern  . Not on file  Social History Narrative   2 sons, 4 pregnancies    Brother is Control and instrumentation engineer    Never smoker exposed 2nd hand    No guns    Wears seat belt    Safe in relationship, widowed lives alone   2 year college ed      Family History: The patient's family history includes Alcohol abuse in her father and mother; Arthritis in her  brother, brother, and sister; Asthma in her son; Birth defects in her brother; COPD in her brother, brother, and sister; Cancer in her father; Depression in her brother, brother, brother, and sister; Diabetes in her brother; Drug abuse in her brother and sister; Hearing loss in her sister; Heart disease in her brother, brother, mother, and sister; Hyperlipidemia in her brother, brother, brother, mother, and sister; Hypertension in her mother and sister; Mental illness in her brother, brother, mother, and sister; Stroke in her brother and mother.  ROS:   Please see the history of present illness.    Muscle pain, easy bruising, shortness of breath with activity, headaches, and anxiety. 34 All other systems reviewed and are negative.  EKGs/Labs/Other Studies Reviewed:  The following studies were reviewed today:  2D Doppler echocardiogram performed June 20, 2018: Study Conclusions  - Left ventricle: The cavity size was normal. Systolic function was   normal. The estimated ejection fraction was in the range of 55%   to 60%. Wall motion was normal; there were no regional wall   motion abnormalities. Doppler parameters are consistent with   abnormal left ventricular relaxation (grade 1 diastolic    dysfunction). - Left atrium: The atrium was normal in size. - Right ventricle: Systolic function was normal. - Pulmonary arteries: Systolic pressure was within the normal   range.  EKG:  EKG ECG in March 2018 is not a available for review but the interpretation with in care everywhere was that of sinus rhythm with poor R wave progression.  I do not see where a more up-to-date tracing has been done.  We mistakenly did not perform an EKG today.  Recent Labs: 06/06/2018: ALT 16; BUN 8; Creatinine, Ser 0.83; Hemoglobin 15.3; Platelets 244.0; Potassium 4.1; Sodium 139; TSH 0.86  Recent Lipid Panel    Component Value Date/Time   CHOL 272 (H) 06/06/2018 1154   TRIG 130.0 06/06/2018 1154   HDL  55.30 06/06/2018 1154   CHOLHDL 5 06/06/2018 1154   VLDL 26.0 06/06/2018 1154   LDLCALC 190 (H) 06/06/2018 1154    Physical Exam:    VS:  BP 136/88   Pulse 82   Ht 5\' 5"  (1.651 m)   Wt 193 lb 12.8 oz (87.9 kg)   SpO2 98%   BMI 32.25 kg/m     Wt Readings from Last 3 Encounters:  09/10/18 193 lb 12.8 oz (87.9 kg)  07/05/18 192 lb 12.8 oz (87.5 kg)  06/06/18 192 lb (87.1 kg)     GEN: Moderate obesity. No acute distress HEENT: Normal NECK: No JVD. LYMPHATICS: No lymphadenopathy CARDIAC: RRR.  No murmur, no gallop, no edema VASCULAR: Radial and carotid 2+ bilateral pulses, no bruits RESPIRATORY:  Clear to auscultation without rales, wheezing or rhonchi  ABDOMEN: Soft, non-tender, non-distended, No pulsatile mass, MUSCULOSKELETAL: No deformity  SKIN: Warm and dry NEUROLOGIC:  Alert and oriented x 3 PSYCHIATRIC:  Normal affect   ASSESSMENT:    1. Hyperlipidemia, unspecified hyperlipidemia type   2. Family history of CHF (congestive heart failure)   3. Essential hypertension   4. Class 2 severe obesity due to excess calories with serious comorbidity and body mass index (BMI) of 35.0 to 35.9 in adult ([place])   5. Diastolic dysfunction    PLAN:    In order of problems listed above:  1. High risk lipid profile with the most recent data available demonstrating an LDL of 190.  The elevated lipid panel in an of itself is a high risk marker but she also has diabetes mellitus type 2, family history of premature vascular disease, obesity, sedentary lifestyle, essential hypertension, and probable disturbed sleep.  We embarked upon a long conversation concerning risk factor modification.  I do not believe we made very much headway in establishing ownership of her risk factors and a management strategy going forward.  Both her lipid and hypertension therapies are taken sporadically.  I recommended a coronary calcium score to further risk stratify with the thought being that if the calcium  score was 0, perhaps treatment of lipids would not be as pressing.  She refused calcium scoring. 2. Noted 3. Slightly elevated.  We discussed aerobic activity, salt restriction to less than 2 g/day, and aerobic activity. 4. We  discussed hemoglobin A1c less than 7.  An SGLT2 agent would be protective given other risk factors. 5. Decrease carbohydrate intake, exercise, and weight loss were discussed. 6. She has diastolic dysfunction but without clinical heart failure.  This does place her at increased risk for development of heart failure in the future given risk factor profile.  Overall education and awareness concerning primary/secondary risk prevention was discussed in detail: LDL less than 70, hemoglobin A1c less than 7, blood pressure target less than 130/80 mmHg, >150 minutes of moderate aerobic activity per week, avoidance of smoking, weight control (via diet and exercise), and continued surveillance/management of/for obstructive sleep apnea.  Overall, this office visit was somewhat frustrating as we spent a lot of time but I do not feel that the patient connected with a message concerning risk prevention.  She needs continued support and education.  I will be happy to see again in the future if her overall level of interest in this matter improves.  Medication Adjustments/Labs and Tests Ordered: Current medicines are reviewed at length with the patient today.  Concerns regarding medicines are outlined above.  Orders Placed This Encounter  Procedures  . CT CARDIAC SCORING   No orders of the defined types were placed in this encounter.   Patient Instructions  Medication Instructions:  Your provider recommends that you continue on your current medications as directed. Please refer to the Current Medication list given to you today.    Labwork: None  Testing/Procedures: Dr. Tamala Julian recommends you have a CORONARY CALCIUM SCORE.  Follow-Up: Your provider recommends that you schedule a  follow-up appointment AS NEEDED with Dr. Tamala Julian pending calcium score results.     Signed, Sinclair Grooms, MD  09/10/2018 12:40 PM    [place]

## 2018-09-10 ENCOUNTER — Encounter: Payer: Self-pay | Admitting: Interventional Cardiology

## 2018-09-10 ENCOUNTER — Ambulatory Visit: Payer: Medicare HMO | Admitting: Interventional Cardiology

## 2018-09-10 VITALS — BP 136/88 | HR 82 | Ht 65.0 in | Wt 193.8 lb

## 2018-09-10 DIAGNOSIS — I5189 Other ill-defined heart diseases: Secondary | ICD-10-CM

## 2018-09-10 DIAGNOSIS — E785 Hyperlipidemia, unspecified: Secondary | ICD-10-CM

## 2018-09-10 DIAGNOSIS — E669 Obesity, unspecified: Secondary | ICD-10-CM | POA: Diagnosis not present

## 2018-09-10 DIAGNOSIS — Z6835 Body mass index (BMI) 35.0-35.9, adult: Secondary | ICD-10-CM

## 2018-09-10 DIAGNOSIS — I10 Essential (primary) hypertension: Secondary | ICD-10-CM | POA: Diagnosis not present

## 2018-09-10 DIAGNOSIS — Z823 Family history of stroke: Secondary | ICD-10-CM

## 2018-09-10 DIAGNOSIS — E1169 Type 2 diabetes mellitus with other specified complication: Secondary | ICD-10-CM | POA: Diagnosis not present

## 2018-09-10 NOTE — Patient Instructions (Signed)
Medication Instructions:  Your provider recommends that you continue on your current medications as directed. Please refer to the Current Medication list given to you today.    Labwork: None  Testing/Procedures: Dr. Tamala Julian recommends you have a CORONARY CALCIUM SCORE.  Follow-Up: Your provider recommends that you schedule a follow-up appointment AS NEEDED with Dr. Tamala Julian pending calcium score results.

## 2018-10-04 ENCOUNTER — Encounter: Payer: Self-pay | Admitting: Internal Medicine

## 2018-10-04 ENCOUNTER — Ambulatory Visit (INDEPENDENT_AMBULATORY_CARE_PROVIDER_SITE_OTHER): Payer: Medicare HMO | Admitting: Internal Medicine

## 2018-10-04 ENCOUNTER — Telehealth: Payer: Self-pay | Admitting: Internal Medicine

## 2018-10-04 VITALS — BP 138/92 | HR 70 | Temp 98.5°F | Ht 65.0 in | Wt 193.2 lb

## 2018-10-04 DIAGNOSIS — M542 Cervicalgia: Secondary | ICD-10-CM | POA: Diagnosis not present

## 2018-10-04 DIAGNOSIS — N644 Mastodynia: Secondary | ICD-10-CM

## 2018-10-04 DIAGNOSIS — M549 Dorsalgia, unspecified: Secondary | ICD-10-CM

## 2018-10-04 DIAGNOSIS — N63 Unspecified lump in unspecified breast: Secondary | ICD-10-CM

## 2018-10-04 DIAGNOSIS — I10 Essential (primary) hypertension: Secondary | ICD-10-CM | POA: Diagnosis not present

## 2018-10-04 NOTE — Telephone Encounter (Signed)
Call patient [phone] she had a reaction to lisinopril so benazepril 10 mg is a cousin of this and we cant given this to her   Does she want to change to a stronger fluid pill I.e chlorthlidone 12.5 mg daily or increase hydrochlorothiazide 12.5 mg to 25 mg for blood pressure control?   [place]

## 2018-10-04 NOTE — Patient Instructions (Addendum)
Verapamil for blood pressure    Enalapril?  Figure out what medication your cousin is taking and call me back today  [phone]    Lidocaine pain patch over the counter  Salonpas cream pain cream over the counter    Chlorthalidone tablets stronger thank hydrochlorothiazide  What is this medicine? CHLORTHALIDONE (klor THAL i done) is a diuretic. It increases the amount of urine passed, which causes the body to lose salt and water. This medicine is used to treat high blood pressure and edema or water retention. This medicine may be used for other purposes; ask your health care provider or pharmacist if you have questions. COMMON BRAND NAME(S): Thalitone What should I tell my health care provider before I take this medicine? They need to know if you have any of these conditions: -asthma -diabetes -gout -kidney disease -liver disease -parathyroid disease -systemic lupus erythematosus (SLE) -taking cortisone, digoxin, lithium carbonate, or drugs for diabetes -an unusual or allergic reaction to chlorthalidone, sulfa drugs, other medicines, foods, dyes, or preservatives -pregnant or trying to get pregnant -breast-feeding How should I use this medicine? Take this medicine by mouth with a glass of water. Follow the directions on the prescription label. It is best to take your dose in the morning with food. Take your medicine at regular intervals. Do not take your medicine more often than directed. Do not stop taking except on your doctor's advice. Talk to your pediatrician regarding the use of this medicine in children. Special care may be needed. Overdosage: If you think you have taken too much of this medicine contact a poison control center or emergency room at once. NOTE: This medicine is only for you. Do not share this medicine with others. What if I miss a dose? If you miss a dose, take it as soon as you can. If it is almost time for your next dose, take only that dose. Do not take  double or extra doses. What may interact with this medicine? -barbiturate medicines for sleep or seizure control -digoxin -lithium -medicines for diabetes -norepinephrine -other medicines for high blood pressure -some pain medicines -steroid hormones like prednisone, cortisone, hydrocortisone, corticotropin -tubocurarine This list may not describe all possible interactions. Give your health care provider a list of all the medicines, herbs, non-prescription drugs, or dietary supplements you use. Also tell them if you smoke, drink alcohol, or use illegal drugs. Some items may interact with your medicine. What should I watch for while using this medicine? Visit your doctor or health care professional for regular check ups. Check your blood pressure as directed. Ask your doctor or health care professional what your blood pressure should be and when you should contact him or her. You may need to be on a special diet while taking this medicine. Ask your doctor. You may get drowsy or dizzy. Do not drive, use machinery, or do anything that needs mental alertness until you know how this medicine affects you. Do not stand or sit up quickly, especially if you are an older patient. This reduces the risk of dizzy or fainting spells. Alcohol may interfere with the effect of this medicine. Avoid alcoholic drinks. This medicine may affect your blood sugar level. If you have diabetes, check with your doctor or health care professional before changing the dose of your diabetic medicine. This medicine can make you more sensitive to the sun. Keep out of the sun. If you cannot avoid being in the sun, wear protective clothing and use sunscreen. Do not use  sun lamps or tanning beds/booths. What side effects may I notice from receiving this medicine? Side effects that you should report to your doctor or health care professional as soon as possible: -allergic reactions like skin rash, itching or hives, swelling of the  face, lips, or tongue -dark urine -dry mouth -excess thirst -fast, irregular heart rate -fever, chills -muscle pain, cramps, or spasm -nausea, vomiting -redness, blistering, peeling or loosening of the skin, including inside the mouth -tingling, pain or numbness in the hands or feet -unusually weak or tired -yellowing of the eyes or skin Side effects that usually do not require medical attention (report to your doctor or health care professional if they continue or are bothersome): -diarrhea or constipation -headache -impotence -loss of appetite -stomach upset This list may not describe all possible side effects. Call your doctor for medical advice about side effects. You may report side effects to FDA at 1-800-FDA-1088. Where should I keep my medicine? Keep out of the reach of children. Store at room temperature between 15 and 30 degrees C (59 and 86 degrees F). Keep container tightly closed. Throw away any unused medicine after the expiration date. NOTE: This sheet is a summary. It may not cover all possible information. If you have questions about this medicine, talk to your doctor, pharmacist, or health care provider.  2019 [employer]vier/Gold Standard (2007-10-23 15:28:48)
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
## 2018-10-04 NOTE — Progress Notes (Signed)
Pre visit review using our clinic review tool, if applicable. No additional management support is needed unless otherwise documented below in the visit note. 

## 2018-10-05 NOTE — Telephone Encounter (Signed)
Patient was informed.  She wants to wait till schedule apppointment

## 2018-10-09 ENCOUNTER — Ambulatory Visit
Admission: RE | Admit: 2018-10-09 | Discharge: 2018-10-09 | Disposition: A | Discharge status: Home / Self Care | Payer: Medicare HMO | Source: Ambulatory Visit | Attending: Internal Medicine | Admitting: Internal Medicine

## 2018-10-09 ENCOUNTER — Other Ambulatory Visit: Payer: Medicare HMO

## 2018-10-09 DIAGNOSIS — N6489 Other specified disorders of breast: Secondary | ICD-10-CM | POA: Diagnosis not present

## 2018-10-09 DIAGNOSIS — N644 Mastodynia: Secondary | ICD-10-CM

## 2018-10-09 DIAGNOSIS — R928 Other abnormal and inconclusive findings on diagnostic imaging of breast: Secondary | ICD-10-CM | POA: Diagnosis not present

## 2018-10-09 DIAGNOSIS — N63 Unspecified lump in unspecified breast: Secondary | ICD-10-CM

## 2019-02-13 ENCOUNTER — Ambulatory Visit (INDEPENDENT_AMBULATORY_CARE_PROVIDER_SITE_OTHER): Payer: Medicare Other | Admitting: Internal Medicine

## 2019-02-13 ENCOUNTER — Other Ambulatory Visit: Payer: Self-pay

## 2019-02-13 ENCOUNTER — Telehealth: Payer: Self-pay | Admitting: Internal Medicine

## 2019-02-13 ENCOUNTER — Encounter: Payer: Self-pay | Admitting: Internal Medicine

## 2019-02-13 VITALS — Wt 180.0 lb

## 2019-02-13 DIAGNOSIS — E785 Hyperlipidemia, unspecified: Secondary | ICD-10-CM | POA: Diagnosis not present

## 2019-02-13 DIAGNOSIS — I10 Essential (primary) hypertension: Secondary | ICD-10-CM

## 2019-02-13 DIAGNOSIS — E559 Vitamin D deficiency, unspecified: Secondary | ICD-10-CM

## 2019-02-13 DIAGNOSIS — R1013 Epigastric pain: Secondary | ICD-10-CM

## 2019-02-13 DIAGNOSIS — Z1329 Encounter for screening for other suspected endocrine disorder: Secondary | ICD-10-CM

## 2019-02-13 DIAGNOSIS — R600 Localized edema: Secondary | ICD-10-CM

## 2019-02-13 DIAGNOSIS — R110 Nausea: Secondary | ICD-10-CM

## 2019-02-13 DIAGNOSIS — E119 Type 2 diabetes mellitus without complications: Secondary | ICD-10-CM

## 2019-02-13 NOTE — Telephone Encounter (Signed)
Pt called to advise Dr. Olivia Mackie of the sample medications name that she was given. The Pt says she only takes half   EDARBYCLOR 40MG -12.5 / please advise

## 2019-02-13 NOTE — Progress Notes (Signed)
Telephone Note  I connected with Marissa King   on 02/13/19 at 11:10 AM EDT by telephone and verified that I am speaking with the correct person using two identifiers.  Location patient: home Location provider:work  Persons participating in the virtual visit: patient, provider  I discussed the limitations of evaluation and management by telemedicine and the availability of in person appointments. The patient expressed understanding and agreed to proceed.   HPI: 1. HTN not checking her BP machine broke and she is only taking hctz 12.5 mg qd but stopped norvasc 5 mg due to side effects. She does report a h/a occasionally but does not think this is related to BP and this had been long term -she has not been able to tolerate ACEI/ARB/CCC/BB but was given a sample of medication and can only tolerate the lowest dose she will call back with the name of medication as I advised we need to better control her BP if she is not willing to increase hctz 25 mg qd take another class of medication   2. Leg swelling drinking 4-5 16 oz of water plus other liquid daily 3. HLD stopped livalo due to side effects nausea couldn't tolerate zetia 4. C/o nausea and loose oily stools she wants her pancreas checked as her father had pancreas issues  5. DM 2 A1C 6.5 on januvia 25 mg qd    ROS: See pertinent positives and negatives per HPI.  Past Medical History:  Diagnosis Date  . Allergy   . 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 Asthma    with h/o chronic bronchitis  . Back pain    mid back pain h/ o trauma   . Diabetes mellitus without complication ([place])   . Frequent headaches   . Gastric ulcer   . GERD (gastroesophageal reflux disease)   . History of chicken pox   . Hyperlipidemia   . Hypertension   . Migraines   . Thyroid disease    mng     Past Surgical History:  Procedure Laterality Date  . ABDOMINAL HYSTERECTOMY     DUB 1 ovary intact     Family History  Problem Relation  Age of Onset  . Alcohol abuse Mother   . Heart disease Mother        CHF  . Hypertension Mother   . Hyperlipidemia Mother   . Stroke Mother   . Mental illness Mother   . Alcohol abuse Father   . Cancer Father        pancreatitic   . Arthritis Sister   . COPD Sister   . Depression Sister   . Drug abuse Sister   . Hearing loss Sister   . Heart disease Sister        CHF  . Hyperlipidemia Sister   . Hypertension Sister   . Mental illness Sister   . COPD Brother   . Depression Brother   . Heart disease Brother   . Hyperlipidemia Brother   . Stroke Brother   . Asthma Son   . Arthritis Brother   . Depression Brother   . Heart disease Brother   . Hyperlipidemia Brother   . Mental illness Brother   . Mental illness Brother   . Hyperlipidemia Brother   . Drug abuse Brother   . Diabetes Brother   . Depression Brother   . COPD Brother   . Birth defects Brother   . Arthritis Brother  SOCIAL HX: widowed lives alone    Current Outpatient Medications:  .  albuterol (PROVENTIL HFA;VENTOLIN HFA) 108 (90 Base) MCG/ACT inhaler, Inhale into the lungs., Disp: , Rfl:  .  Apple Cider Vinegar 500 MG TABS, Take by mouth., Disp: , Rfl:  .  budesonide-formoterol (SYMBICORT) 160-4.5 MCG/ACT inhaler, Inhale 2 puffs into the lungs 2 (two) times daily., Disp: , Rfl:  .  cetirizine (ZYRTEC) 10 MG tablet, Take by mouth., Disp: , Rfl:  .  glucose blood (PRECISION QID TEST) test strip, Check sugars 1-2 times daily. E11.9 Non-insulin dependent, Disp: , Rfl:  .  hydrochlorothiazide (MICROZIDE) 12.5 MG capsule, Take 12.5 mg by mouth as needed (Blood pressure). , Disp: , Rfl:  .  ipratropium (ATROVENT) 0.06 % nasal spray, Place into the nose., Disp: , Rfl:  .  Lancets Misc. (UNISTIK 2 NORMAL) MISC, Use to monitor blood sugar, Disp: , Rfl:  .  nystatin cream (MYCOSTATIN), Apply bid to intertrigo., Disp: , Rfl:  .  ONETOUCH DELICA LANCETS FINE MISC, USE TO CHECK BLOOD SUGAR 1-2 TIMES DAILY, Disp: ,  Rfl:  .  sitaGLIPtin (JANUVIA) 25 MG tablet, Take 25 mg by mouth daily., Disp: , Rfl:  .  triamcinolone cream (KENALOG) 0.1 %, Apply topically., Disp: , Rfl:  .  VITAMIN D PO, Take 5,000 Units by mouth daily. , Disp: , Rfl:   EXAM:  VITALS per patient if applicable:  GENERAL: alert, oriented, appears well and in no acute distress  MS: moves all visible extremities without noticeable abnormality  PSYCH/NEURO: pleasant and cooperative, no obvious depression or anxiety, speech and thought processing grossly intact  ASSESSMENT AND PLAN:  Discussed the following assessment and plan:  Type 2 diabetes mellitus without complication, without long-term current use of insulin ([place]) - Plan: A1C 6.5 sch fasting labs cont januvia 25 mg qd   Essential hypertension - Plan: on hctz 12.5 mg qd she does not feel she will be able to tolerate higher dose or chlorthalidone  per pt allergic several meds and cant tolerate norvasc 5 mg so stopped  Will call back with name of sample medication she has she is able to take  Will get name of cardiology in network with insurance and will need CT cardiac for Ca score  -with leg edema rec reduce water intake to 3-4 16 oz daily with additional fluids she is drinking   Hyperlipidemia, unspecified hyperlipidemia type - cant tolerate several meds  Consider repatha in future   Nausea, Epigastric abdominal pain - Plan: Comprehensive metabolic panel, CBC w/Diff, Lipase, US Abdomen Complete  HM Declines flu shot  utd prevnar and pna 23  Check date of Tdap  Consider shingrix disc in future   LMP 07/1997 pap 12/09/14 neg pap neg HPV s/p hysterectomy no h/o abnormal pap 1 ovary intact ? Which one Mammogramfat necrosis right breast 11/14/19and 10/09/18 normal  Colonoscopy 05/18/16 tubular adenoma Dr. Jerene Pitch GI f/u in 5 years and FH colon cancer in dad DEXA 05/31/16 osteopenia consider repeat in 3-5 years vit D normal85.5 07/07/17 Hep C neg 07/07/17  Never smoker  h/o 2nd hand exposure  New insurance [employer] per pt today     I discussed the assessment and treatment plan with the patient. The patient was provided an opportunity to ask questions and all were answered. The patient agreed with the plan and demonstrated an understanding of the instructions.   The patient was advised to call back or seek an in-person evaluation if the symptoms worsen or  if the condition fails to improve as anticipated.  Time spent 25 minutes new insurance [employer] per pt Delorise Jackson, MD

## 2019-02-14 NOTE — Telephone Encounter (Signed)
This medication is a combination of chlorthalidone and a newer cousin of losartan Azilsartan  It may be expensive  If we tried this she would have to stop hctz   What does she want to do about blood pressure?   Has she tried these samples?   Did she figure out which cardiologist is in her network?   Marissa King

## 2019-02-14 NOTE — Telephone Encounter (Signed)
Left message for patient to return call back. [facility] may give and obtain information.  

## 2019-02-15 ENCOUNTER — Telehealth: Payer: Self-pay | Admitting: Internal Medicine

## 2019-02-15 NOTE — Telephone Encounter (Signed)
Patient returned call: Patient wants try the lowest dose possible- she is taking 1/4 of a pill of the new medication- she wants the lowest dose possible. Patient is aware she will not take HCTZ with new medication. Patient needs cardiologist in [facility]- her husband has been seen there- They are associated with [place]. She is not sure of the names.Patient is aware the medication is expensive and she does have help with that. Patient has nat had a chance to get BP cuff yet- she is working on getting that. (Stressed importance of monitoring BP with change in medication) (Patient has history of Lisinopril usage- she needs lowest dose if has to use this medication)

## 2019-02-15 NOTE — Telephone Encounter (Signed)
I call pt and left vm to Return for sch fasting labs asap, f/u in 3-4 months.

## 2019-02-19 ENCOUNTER — Ambulatory Visit: Payer: Medicare Other

## 2019-02-21 ENCOUNTER — Other Ambulatory Visit: Payer: Self-pay | Admitting: Internal Medicine

## 2019-02-21 ENCOUNTER — Other Ambulatory Visit: Payer: Self-pay

## 2019-02-21 ENCOUNTER — Telehealth: Payer: Self-pay | Admitting: *Deleted

## 2019-02-21 ENCOUNTER — Other Ambulatory Visit (INDEPENDENT_AMBULATORY_CARE_PROVIDER_SITE_OTHER): Payer: Medicare Other

## 2019-02-21 DIAGNOSIS — E119 Type 2 diabetes mellitus without complications: Secondary | ICD-10-CM | POA: Diagnosis not present

## 2019-02-21 DIAGNOSIS — E785 Hyperlipidemia, unspecified: Secondary | ICD-10-CM | POA: Diagnosis not present

## 2019-02-21 DIAGNOSIS — R1013 Epigastric pain: Secondary | ICD-10-CM | POA: Diagnosis not present

## 2019-02-21 DIAGNOSIS — R110 Nausea: Secondary | ICD-10-CM | POA: Diagnosis not present

## 2019-02-21 DIAGNOSIS — Z1329 Encounter for screening for other suspected endocrine disorder: Secondary | ICD-10-CM

## 2019-02-21 DIAGNOSIS — E559 Vitamin D deficiency, unspecified: Secondary | ICD-10-CM | POA: Diagnosis not present

## 2019-02-21 DIAGNOSIS — I10 Essential (primary) hypertension: Secondary | ICD-10-CM

## 2019-02-21 LAB — LIPASE: Lipase: 8 U/L — ABNORMAL LOW (ref 11.0–59.0)

## 2019-02-21 LAB — LIPID PANEL
Cholesterol: 267 mg/dL — ABNORMAL HIGH (ref 0–200)
HDL: 48.4 mg/dL (ref >39.00)
LDL Cholesterol: 199 mg/dL — ABNORMAL HIGH (ref 0–99)
NonHDL: 218.89
Total CHOL/HDL Ratio: 6
Triglycerides: 99 mg/dL (ref 0.0–149.0)
VLDL: 19.8 mg/dL (ref 0.0–40.0)

## 2019-02-21 LAB — CBC WITH DIFFERENTIAL/PLATELET
Basophils Absolute: 0 10*3/uL (ref 0.0–0.1)
Basophils Relative: 1 % (ref 0.0–3.0)
Eosinophils Absolute: 0.1 10*3/uL (ref 0.0–0.7)
Eosinophils Relative: 2.1 % (ref 0.0–5.0)
HCT: 43.4 % (ref 36.0–46.0)
Hemoglobin: 14.4 g/dL (ref 12.0–15.0)
Lymphocytes Relative: 37.5 % (ref 12.0–46.0)
Lymphs Abs: 1.4 10*3/uL (ref 0.7–4.0)
MCHC: 33.2 g/dL (ref 30.0–36.0)
MCV: 86.8 fl (ref 78.0–100.0)
Monocytes Absolute: 0.4 10*3/uL (ref 0.1–1.0)
Monocytes Relative: 11.2 % (ref 3.0–12.0)
Neutro Abs: 1.8 10*3/uL (ref 1.4–7.7)
Neutrophils Relative %: 48.2 % (ref 43.0–77.0)
Platelets: 237 10*3/uL (ref 150.0–400.0)
RBC: 5 Mil/uL (ref 3.87–5.11)
RDW: 14.6 % (ref 11.5–15.5)
WBC: 3.8 10*3/uL — ABNORMAL LOW (ref 4.0–10.5)

## 2019-02-21 LAB — COMPREHENSIVE METABOLIC PANEL
ALT: 12 U/L (ref 0–35)
AST: 18 U/L (ref 0–37)
Albumin: 4.2 g/dL (ref 3.5–5.2)
Alkaline Phosphatase: 82 U/L (ref 39–117)
BUN: 9 mg/dL (ref 6–23)
CO2: 28 mEq/L (ref 19–32)
Calcium: 9.8 mg/dL (ref 8.4–10.5)
Chloride: 104 mEq/L (ref 96–112)
Creatinine, Ser: 0.87 mg/dL (ref 0.40–1.20)
GFR: 77.42 mL/min (ref >60.00)
Glucose, Bld: 91 mg/dL (ref 70–99)
Potassium: 4.1 mEq/L (ref 3.5–5.1)
Sodium: 141 mEq/L (ref 135–145)
Total Bilirubin: 0.6 mg/dL (ref 0.2–1.2)
Total Protein: 7.2 g/dL (ref 6.0–8.3)

## 2019-02-21 LAB — TSH: TSH: 1.1 u[IU]/mL (ref 0.35–4.50)

## 2019-02-21 LAB — VITAMIN D 25 HYDROXY (VIT D DEFICIENCY, FRACTURES): VITD: 69.75 ng/mL (ref 30.00–100.00)

## 2019-02-21 LAB — HEMOGLOBIN A1C: Hgb A1c MFr Bld: 6.5 % (ref 4.6–6.5)

## 2019-02-21 MED ORDER — IPRATROPIUM BROMIDE 0.06 % NA SOLN: 2.0000 | Freq: Four times a day (QID) | NASAL | 12 refills | Status: DC

## 2019-02-21 MED ORDER — AZILSARTAN-CHLORTHALIDONE 40-12.5 MG PO TABS: 0.5000 | ORAL_TABLET | Freq: Every morning | ORAL | 11 refills | Status: DC

## 2019-02-21 NOTE — Progress Notes (Addendum)
Pt asked to have her BP checked while she was here for a lab appt. She also mentioned that she needed meds refilled today. I gave her a med list & asked her to put a star by what she needed refilled.   BP 186/103 on hctz 12.5 mg qam she has a lot of intolerances/allergies to prior BP meds She is agreeable to try Azilsartan-chlorthalidone 40-12.5 mg qd and take 1/2 pill for now   Will recheck BP later  [place]

## 2019-02-21 NOTE — Addendum Note (Signed)
Addended by: Orland Mustard on: 02/21/2019 12:36 PM   Modules accepted: Orders

## 2019-02-21 NOTE — Telephone Encounter (Signed)
Opened in error to document BP, see lab appt for vitals.

## 2019-02-21 NOTE — Telephone Encounter (Signed)
Addressed see note 02/21/19  [place]

## 2019-02-22 LAB — URINALYSIS, ROUTINE W REFLEX MICROSCOPIC
Bilirubin Urine: NEGATIVE
Glucose, UA: NEGATIVE
Hgb urine dipstick: NEGATIVE
Ketones, ur: NEGATIVE
Leukocytes,Ua: NEGATIVE
Nitrite: NEGATIVE
Protein, ur: NEGATIVE
Specific Gravity, Urine: 1.01 (ref 1.001–1.03)
pH: 8 (ref 5.0–8.0)

## 2019-02-28 ENCOUNTER — Ambulatory Visit
Admission: RE | Admit: 2019-02-28 | Discharge: 2019-02-28 | Disposition: A | Discharge status: Home / Self Care | Payer: Medicare Other | Source: Ambulatory Visit | Attending: Internal Medicine | Admitting: Internal Medicine

## 2019-02-28 ENCOUNTER — Other Ambulatory Visit: Payer: Self-pay

## 2019-02-28 DIAGNOSIS — K8020 Calculus of gallbladder without cholecystitis without obstruction: Secondary | ICD-10-CM | POA: Diagnosis not present

## 2019-02-28 DIAGNOSIS — R110 Nausea: Secondary | ICD-10-CM | POA: Diagnosis not present

## 2019-02-28 DIAGNOSIS — R1013 Epigastric pain: Secondary | ICD-10-CM | POA: Diagnosis not present

## 2019-03-01 ENCOUNTER — Other Ambulatory Visit: Payer: Self-pay | Admitting: Internal Medicine

## 2019-03-01 DIAGNOSIS — K8020 Calculus of gallbladder without cholecystitis without obstruction: Secondary | ICD-10-CM

## 2019-03-04 ENCOUNTER — Telehealth: Payer: Self-pay | Admitting: Internal Medicine

## 2019-03-04 NOTE — Telephone Encounter (Signed)
°  Relation to pt: self  Call back number: [phone]    Reason for call:  Patient requesting to speak with "Fransisco Beau" regarding lab results and surgery, please advise

## 2019-03-05 NOTE — Telephone Encounter (Signed)
We referred her to GI Dr. Lucia Gaskins in [place] to discuss options including surgery   [place]

## 2019-03-05 NOTE — Telephone Encounter (Signed)
Pt called back to check the status of this message .  Pt stated that it was ok if the operation is going to be [place].  She is ok with [facility] [place]

## 2019-03-06 NOTE — Telephone Encounter (Signed)
Patient was informed.  Patient understood and no questions, comments, or concerns at this time.  

## 2019-04-30 ENCOUNTER — Ambulatory Visit (INDEPENDENT_AMBULATORY_CARE_PROVIDER_SITE_OTHER): Payer: Medicare Other

## 2019-04-30 ENCOUNTER — Other Ambulatory Visit: Payer: Self-pay

## 2019-04-30 DIAGNOSIS — Z0000 Encounter for general adult medical examination without abnormal findings: Secondary | ICD-10-CM | POA: Diagnosis not present

## 2019-04-30 NOTE — Progress Notes (Signed)
Subjective:   Marissa King is a 73 y.o. female who presents for an Initial Medicare Annual Wellness Visit.  Review of Systems    No ROS.  Medicare Wellness Virtual Visit.  Visual/audio telehealth visit, UTA vital signs.   See social history for additional risk factors.    Cardiac Risk Factors include: advanced age (>92men, >70 women);hypertension     Objective:    Today's Vitals   There is no height or weight on file to calculate BMI.  Advanced Directives 04/30/2019  Does Patient Have a Medical Advance Directive? No  Does patient want to make changes to medical advance directive? Yes (MAU/Ambulatory/Procedural Areas - Information given)    Current Medications (verified) Outpatient Encounter Medications as of 04/30/2019  Medication Sig  . albuterol (PROVENTIL HFA;VENTOLIN HFA) 108 (90 Base) MCG/ACT inhaler Inhale into the lungs.  . Apple Cider Vinegar 500 MG TABS Take by mouth.  . Azilsartan-Chlorthalidone 40-12.5 MG TABS Take 0.5 tablets by mouth every morning.  . budesonide-formoterol (SYMBICORT) 160-4.5 MCG/ACT inhaler Inhale 2 puffs into the lungs 2 (two) times daily.  . cetirizine (ZYRTEC) 10 MG tablet Take by mouth.  Marland Kitchen glucose blood (PRECISION QID TEST) test strip Check sugars 1-2 times daily. 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 ipratropium (ATROVENT) 0.06 % nasal spray Place 2 sprays into the nose 4 (four) times daily.  . Lancets Misc. (UNISTIK 2 NORMAL) MISC Use to monitor blood sugar  . nystatin cream (MYCOSTATIN) Apply bid to intertrigo.  Glory Rosebush DELICA LANCETS FINE MISC USE TO CHECK BLOOD SUGAR 1-2 TIMES DAILY  . sitaGLIPtin (JANUVIA) 25 MG tablet Take 25 mg by mouth daily.  Marland Kitchen triamcinolone cream (KENALOG) 0.1 % Apply topically.  Marland Kitchen VITAMIN D PO Take 5,000 Units by mouth daily.    No facility-administered encounter medications on file as of 04/30/2019.     Allergies (verified) Alendronate, Alprazolam, Amlodipine, Atorvastatin, Benzalkonium chloride, Clindamycin,  Clonazepam, Clonidine, Escitalopram oxalate, Gabapentin, Hydrocodone-acetaminophen, Iodine, Labetalol, Levofloxacin, Lisinopril-hydrochlorothiazide, Livalo [pitavastatin], Lorazepam, Losartan potassium, Losartan potassium-hctz, Meloxicam, Metformin, Metoclopramide, Oxcarbazepine, Penicillins, Potassium citrate, Pravastatin, Rosuvastatin, Simvastatin, Sitagliptin, Sodium citrate, Spironolactone, Sulfa antibiotics, and Zetia [ezetimibe]   History: Past Medical History:  Diagnosis Date  . Allergy   . 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 Asthma    with h/o chronic bronchitis  . Back pain    mid back pain h/ o trauma   . Diabetes mellitus without complication ([place])   . Frequent headaches   . Gastric ulcer   . GERD (gastroesophageal reflux disease)   . History of chicken pox   . Hyperlipidemia   . Hypertension   . Migraines   . Thyroid disease    mng    Past Surgical History:  Procedure Laterality Date  . ABDOMINAL HYSTERECTOMY     DUB 1 ovary intact    Family History  Problem Relation Age of Onset  . Alcohol abuse Mother   . Heart disease Mother        CHF  . Hypertension Mother   . Hyperlipidemia Mother   . Stroke Mother   . Mental illness Mother   . Alcohol abuse Father   . Cancer Father        pancreatitic   . Arthritis Sister   . COPD Sister   . Depression Sister   . Drug abuse Sister   . Hearing loss Sister   . Heart disease Sister        CHF  . Hyperlipidemia  Sister   . Hypertension Sister   . Mental illness Sister   . COPD Brother   . Depression Brother   . Heart disease Brother   . Hyperlipidemia Brother   . Stroke Brother   . Asthma Son   . Arthritis Brother   . Depression Brother   . Heart disease Brother   . Hyperlipidemia Brother   . Mental illness Brother   . Mental illness Brother   . Hyperlipidemia Brother   . Drug abuse Brother   . Diabetes Brother   . Depression Brother   . COPD Brother   . Birth defects Brother   .  Arthritis Brother    Social History   Socioeconomic History  . Marital status: Widowed    Spouse name: Not on file  . Number of children: Not on file  . Years of education: Not on file  . Highest education level: Not on file  Occupational History  . Not on file  Social Needs  . Financial resource strain: Not hard at all  . Food insecurity    Worry: Never true    Inability: Never true  . Transportation needs    Medical: No    Non-medical: No  Tobacco Use  . Smoking status: Never Smoker  . Smokeless tobacco: Never Used  Substance and Sexual Activity  . Alcohol use: No  . Drug use: No  . Sexual activity: Not Currently  Lifestyle  . Physical activity    Days per week: 4 days    Minutes per session: 40 min  . Stress: Not at all  Relationships  . Social Herbalist on phone: Not on file    Gets together: Not on file    Attends religious service: Not on file    Active member of club or organization: Not on file    Attends meetings of clubs or organizations: Not on file    Relationship status: Not on file  Other Topics Concern  . Not on file  Social History Narrative   2 sons, 4 pregnancies    Brother is Control and instrumentation engineer    Never smoker exposed 2nd hand    No guns    Wears seat belt    Safe in relationship, widowed lives alone   2 year college ed     Tobacco Counseling Counseling given: Not Answered   Clinical Intake:  Pre-visit preparation completed: Yes        Diabetes: No  How often do you need to have someone help you when you read instructions, pamphlets, or other written materials from your doctor or pharmacy?: 1 - Never  Interpreter Needed?: No      Activities of Daily Living In your present state of health, do you have any difficulty performing the following activities: 04/30/2019  Hearing? N  Vision? N  Difficulty concentrating or making decisions? N  Walking or climbing stairs? N  Dressing or bathing? N  Doing errands, shopping?  N  Preparing Food and eating ? N  Using the Toilet? N  In the past six months, have you accidently leaked urine? N  Do you have problems with loss of bowel control? N  Managing your Medications? N  Managing your Finances? N  Housekeeping or managing your Housekeeping? N  Some recent data might be hidden     Immunizations and Health Maintenance Immunization History  Administered Date(s) Administered  . Pneumococcal Conjugate-13 10/17/2016  . Pneumococcal Polysaccharide-23 02/08/2018   Health Maintenance  Due  Topic Date Due  . FOOT EXAM  01/07/1956    Patient Care Team: McLean-Scocuzza, Nino Glow, MD as PCP - General (Internal Medicine)  Indicate any recent Medical Services you may have received from other than [facility] providers in the past year (date may be approximate).     Assessment:   This is a routine wellness examination for [facility].  I connected with patient 04/30/19 at 11:00 AM EDT by an audio enabled telemedicine application and verified that I am speaking with the correct person using two identifiers. Patient stated full name and DOB. Patient gave permission to continue with virtual visit. Patient's location was at home and Nurse's location was at [place] office.   Health Maintenance Due: Foot exam: followed by pcp  Eye: Visual acuity not assessed. Virtual visit. Wears corrective lenses.   Dental: Visits every 6 months.    Hearing: Demonstrates normal hearing during visit.  Safety:  Patient feels safe at home- yes Patient does have smoke detectors at home- yes Patient does wear sunscreen or protective clothing when in direct sunlight - yes Patient does wear seat belt when in a moving vehicle - yes Patient drives- yes Adequate lighting in walkways free from debris- yes Grab bars and handrails used as appropriate- yes Ambulates with a cane as an assistive device as needed Cell phone on person when ambulating outside of the home- yes  Social: Alcohol intake -  no    Smoking history- never   Smokers in home? none Illicit drug use? none  Depression: PHQ 2 &9 complete. See screening below. Denies irritability, anhedonia, sadness/tearfullness.  Stable.   Falls: See screening below.    Medication: Taking as directed and without issues.   Covid-19: Precautions and sickness symptoms discussed. Wears mask, social distancing, hand hygiene as appropriate.   Activities of Daily Living Patient denies needing assistance with: household chores, feeding themselves, getting from bed to chair, getting to the toilet, bathing/showering, dressing, managing money, or preparing meals.   Memory: Patient is alert. Patient denies difficulty focusing or concentrating. Correctly identified the president of the [place], season and recall. Patient likes to read for brain stimulation.  BMI- discussed the importance of a healthy diet, water intake and the benefits of aerobic exercise.  Educational material provided.  Physical activity- walking daily, 45 minutes at 4-5 laps around the track.  Diet: Low sodium Water: good intake  Other Providers Patient Care Team: McLean-Scocuzza, Nino Glow, MD as PCP - General (Internal Medicine)  Hearing/Vision screen  Hearing Screening   125Hz  250Hz  500Hz  1000Hz  2000Hz  3000Hz  4000Hz  6000Hz  8000Hz   Right ear:           Left ear:           Comments: Patient is able to hear conversational tones without difficulty.  No issues reported.  Vision Screening Comments: Cataract extraction, bilateral Visual acuity not assessed, virtual visit.      Dietary issues and exercise activities discussed: Current Exercise Habits: Home exercise routine, Type of exercise: walking, Time (Minutes): 45, Frequency (Times/Week): 4, Weekly Exercise (Minutes/Week): 180, Intensity: Mild  Goals      Patient Stated   . Follow up with Primary Care Provider (pt-stated)     As needed      Depression Screen PHQ 2/9 Scores 04/30/2019 02/13/2019  06/06/2018  PHQ - 2 Score 0 0 0    Fall Risk Fall Risk  04/30/2019 02/13/2019 06/06/2018  Falls in the past year? 0 0 0   Cognitive Function:  6CIT Screen 04/30/2019  What Year? 0 points  What month? 0 points  What time? 0 points  Count back from 20 0 points  Months in reverse 0 points  Repeat phrase 0 points  Total Score 0    Screening Tests Health Maintenance  Topic Date Due  . FOOT EXAM  01/07/1956  . INFLUENZA VACCINE  10/30/2019 (Originally 03/02/2019)  . TETANUS/TDAP  04/29/2020 (Originally 01/07/1966)  . HEMOGLOBIN A1C  08/24/2019  . OPHTHALMOLOGY EXAM  10/14/2019  . MAMMOGRAM  10/08/2020  . COLONOSCOPY  05/18/2026  . DEXA SCAN  Completed  . Hepatitis C Screening  Completed  . PNA vac Low Risk Adult  Completed     Plan:   Keep all routine maintenance appointments.   Follow up 05/22/19 @ 930. Requests 90 day supply of Azilsartan-Chlorthalidone due to cost. Spot checking blood pressure and within normal limits.   Medicare Attestation I have personally reviewed: The patient's medical and social history Their use of alcohol, tobacco or illicit drugs Their current medications and supplements The patient's functional ability including ADLs,fall risks, home safety risks, cognitive, and hearing and visual impairment Diet and physical activities Evidence for depression   In addition, I have reviewed and discussed with patient certain preventive protocols, quality metrics, and best practice recommendations. A written personalized care plan for preventive services as well as general preventive health recommendations were provided to patient via mail.     Varney Biles, LPN   [id number]

## 2019-04-30 NOTE — Patient Instructions (Addendum)
  Marissa King , Thank you for taking time to come for your Medicare Wellness Visit. I appreciate your ongoing commitment to your health goals. Please review the following plan we discussed and let me know if I can assist you in the future.   These are the goals we discussed: Goals      Patient Stated   . Follow up with Primary Care Provider (pt-stated)     As needed       This is a list of the screening recommended for you and due dates:  Health Maintenance  Topic Date Due  . Complete foot exam   01/07/1956  . Flu Shot  10/30/2019*  . Tetanus Vaccine  04/29/2020*  . Hemoglobin A1C  08/24/2019  . Eye exam for diabetics  10/14/2019  . Mammogram  10/08/2020  . Colon Cancer Screening  05/18/2026  . DEXA scan (bone density measurement)  Completed  .  Hepatitis C: One time screening is recommended by Center for Disease Control  (CDC) for  adults born from [birth year] through 1964.   Completed  . Pneumonia vaccines  Completed  *Topic was postponed. The date shown is not the original due date.

## 2019-05-13 DIAGNOSIS — E785 Hyperlipidemia, unspecified: Secondary | ICD-10-CM | POA: Diagnosis not present

## 2019-05-13 DIAGNOSIS — I10 Essential (primary) hypertension: Secondary | ICD-10-CM | POA: Diagnosis not present

## 2019-05-13 DIAGNOSIS — E7800 Pure hypercholesterolemia, unspecified: Secondary | ICD-10-CM | POA: Diagnosis not present

## 2019-05-20 ENCOUNTER — Other Ambulatory Visit: Payer: Self-pay

## 2019-05-22 ENCOUNTER — Other Ambulatory Visit: Payer: Self-pay | Admitting: Internal Medicine

## 2019-05-22 ENCOUNTER — Ambulatory Visit (INDEPENDENT_AMBULATORY_CARE_PROVIDER_SITE_OTHER): Payer: Medicare Other | Admitting: Internal Medicine

## 2019-05-22 ENCOUNTER — Other Ambulatory Visit: Payer: Self-pay

## 2019-05-22 VITALS — BP 130/88 | HR 75 | Temp 98.0°F | Ht 65.0 in | Wt 180.2 lb

## 2019-05-22 DIAGNOSIS — R42 Dizziness and giddiness: Secondary | ICD-10-CM | POA: Diagnosis not present

## 2019-05-22 DIAGNOSIS — R269 Unspecified abnormalities of gait and mobility: Secondary | ICD-10-CM | POA: Diagnosis not present

## 2019-05-22 DIAGNOSIS — E042 Nontoxic multinodular goiter: Secondary | ICD-10-CM

## 2019-05-22 DIAGNOSIS — R2681 Unsteadiness on feet: Secondary | ICD-10-CM | POA: Diagnosis not present

## 2019-05-22 DIAGNOSIS — E785 Hyperlipidemia, unspecified: Secondary | ICD-10-CM | POA: Diagnosis not present

## 2019-05-22 DIAGNOSIS — E119 Type 2 diabetes mellitus without complications: Secondary | ICD-10-CM | POA: Diagnosis not present

## 2019-05-22 DIAGNOSIS — Z8673 Personal history of transient ischemic attack (TIA), and cerebral infarction without residual deficits: Secondary | ICD-10-CM | POA: Diagnosis not present

## 2019-05-22 DIAGNOSIS — I10 Essential (primary) hypertension: Secondary | ICD-10-CM | POA: Diagnosis not present

## 2019-05-22 DIAGNOSIS — I6521 Occlusion and stenosis of right carotid artery: Secondary | ICD-10-CM | POA: Diagnosis not present

## 2019-05-22 DIAGNOSIS — R519 Headache, unspecified: Secondary | ICD-10-CM | POA: Diagnosis not present

## 2019-05-22 DIAGNOSIS — J449 Chronic obstructive pulmonary disease, unspecified: Secondary | ICD-10-CM | POA: Diagnosis not present

## 2019-05-22 DIAGNOSIS — R2689 Other abnormalities of gait and mobility: Secondary | ICD-10-CM | POA: Diagnosis not present

## 2019-05-22 DIAGNOSIS — R531 Weakness: Secondary | ICD-10-CM | POA: Diagnosis not present

## 2019-05-22 DIAGNOSIS — I6502 Occlusion and stenosis of left vertebral artery: Secondary | ICD-10-CM | POA: Diagnosis not present

## 2019-05-22 DIAGNOSIS — Z79899 Other long term (current) drug therapy: Secondary | ICD-10-CM | POA: Diagnosis not present

## 2019-05-22 DIAGNOSIS — M4802 Spinal stenosis, cervical region: Secondary | ICD-10-CM | POA: Diagnosis not present

## 2019-05-22 DIAGNOSIS — K8020 Calculus of gallbladder without cholecystitis without obstruction: Secondary | ICD-10-CM | POA: Insufficient documentation

## 2019-05-22 DIAGNOSIS — G43909 Migraine, unspecified, not intractable, without status migrainosus: Secondary | ICD-10-CM | POA: Diagnosis not present

## 2019-05-22 DIAGNOSIS — I517 Cardiomegaly: Secondary | ICD-10-CM | POA: Diagnosis not present

## 2019-05-22 DIAGNOSIS — I493 Ventricular premature depolarization: Secondary | ICD-10-CM | POA: Diagnosis not present

## 2019-05-22 DIAGNOSIS — M316 Other giant cell arteritis: Secondary | ICD-10-CM | POA: Diagnosis not present

## 2019-05-22 DIAGNOSIS — E041 Nontoxic single thyroid nodule: Secondary | ICD-10-CM

## 2019-05-22 DIAGNOSIS — E7800 Pure hypercholesterolemia, unspecified: Secondary | ICD-10-CM | POA: Diagnosis not present

## 2019-05-22 DIAGNOSIS — K219 Gastro-esophageal reflux disease without esophagitis: Secondary | ICD-10-CM | POA: Diagnosis not present

## 2019-05-22 DIAGNOSIS — I639 Cerebral infarction, unspecified: Secondary | ICD-10-CM | POA: Diagnosis not present

## 2019-05-22 DIAGNOSIS — M6281 Muscle weakness (generalized): Secondary | ICD-10-CM | POA: Diagnosis not present

## 2019-05-22 DIAGNOSIS — H3411 Central retinal artery occlusion, right eye: Secondary | ICD-10-CM | POA: Diagnosis not present

## 2019-05-22 DIAGNOSIS — R079 Chest pain, unspecified: Secondary | ICD-10-CM | POA: Diagnosis not present

## 2019-05-22 DIAGNOSIS — I6522 Occlusion and stenosis of left carotid artery: Secondary | ICD-10-CM | POA: Diagnosis not present

## 2019-05-22 DIAGNOSIS — I160 Hypertensive urgency: Secondary | ICD-10-CM | POA: Diagnosis not present

## 2019-05-22 DIAGNOSIS — H5461 Unqualified visual loss, right eye, normal vision left eye: Secondary | ICD-10-CM | POA: Diagnosis not present

## 2019-05-22 MED ORDER — AZILSARTAN-CHLORTHALIDONE 40-12.5 MG PO TABS: 1.0000 | ORAL_TABLET | Freq: Every morning | ORAL | 3 refills | Status: DC

## 2019-05-22 NOTE — Progress Notes (Addendum)
Chief Complaint  Patient presents with  . Follow-up   F/u 1. HTN improved on 1/2 pill of azil-chlorthalidone 40-12.5 so taking 20-6.25  2. Right eye cant see out of today seeing eye MD today  3. MNG and and left thyroid nodule needs bx  Results for Marissa King, Marissa King (MRN [id number]) as of 05/22/2019 10:22  06/06/2018 11:54 TSH: 0.86 T4,Free(Direct): 0.80  02/21/2019 11:34 TSH: 1.10   Review of Systems  Constitutional: Negative for weight loss.  HENT: Negative for hearing loss.   Eyes: Negative for blurred vision.       Reduced vision right eye    Respiratory: Negative for shortness of breath.   Cardiovascular: Negative for chest pain.  Gastrointestinal: Negative for abdominal pain.  Skin: Negative for rash.  Neurological: Negative for headaches.  Psychiatric/Behavioral: Negative for depression.   Past Medical History:  Diagnosis Date  . Allergy   . 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 Asthma    with h/o chronic bronchitis  . Back pain    mid back pain h/ o trauma   . Diabetes mellitus without complication ([place])   . Frequent headaches   . Gastric ulcer   . GERD (gastroesophageal reflux disease)   . History of chicken pox   . Hyperlipidemia   . Hypertension   . Migraines   . Thyroid disease    mng    Past Surgical History:  Procedure Laterality Date  . ABDOMINAL HYSTERECTOMY     DUB 1 ovary intact    Family History  Problem Relation Age of Onset  . Alcohol abuse Mother   . Heart disease Mother        CHF  . Hypertension Mother   . Hyperlipidemia Mother   . Stroke Mother   . Mental illness Mother   . Alcohol abuse Father   . Cancer Father        pancreatitic   . Arthritis Sister   . COPD Sister   . Depression Sister   . Drug abuse Sister   . Hearing loss Sister   . Heart disease Sister        CHF  . Hyperlipidemia Sister   . Hypertension Sister   . Mental illness Sister   . COPD Brother   . Depression Brother   . Heart disease  Brother   . Hyperlipidemia Brother   . Stroke Brother   . Asthma Son   . Arthritis Brother   . Depression Brother   . Heart disease Brother   . Hyperlipidemia Brother   . Mental illness Brother   . Mental illness Brother   . Hyperlipidemia Brother   . Drug abuse Brother   . Diabetes Brother   . Depression Brother   . COPD Brother   . Birth defects Brother   . Arthritis Brother    Social History   Socioeconomic History  . Marital status: Widowed    Spouse name: Not on file  . Number of children: Not on file  . Years of education: Not on file  . Highest education level: Not on file  Occupational History  . Not on file  Social Needs  . Financial resource strain: Not hard at all  . Food insecurity    Worry: Never true    Inability: Never true  . Transportation needs    Medical: No    Non-medical: No  Tobacco Use  . Smoking status: Never Smoker  . Smokeless tobacco:  Never Used  Substance and Sexual Activity  . Alcohol use: No  . Drug use: No  . Sexual activity: Not Currently  Lifestyle  . Physical activity    Days per week: 4 days    Minutes per session: 40 min  . Stress: Not at all  Relationships  . Social Herbalist on phone: Not on file    Gets together: Not on file    Attends religious service: Not on file    Active member of club or organization: Not on file    Attends meetings of clubs or organizations: Not on file    Relationship status: Not on file  . Intimate partner violence    Fear of current or ex partner: Not on file    Emotionally abused: Not on file    Physically abused: Not on file    Forced sexual activity: Not on file  Other Topics Concern  . Not on file  Social History Narrative   2 sons, 4 pregnancies    Brother is [name]    Never smoker exposed 2nd hand    No guns    Wears seat belt    Safe in relationship, widowed lives alone   2 year college ed    Current Meds  Medication Sig  . albuterol (PROVENTIL  HFA;VENTOLIN HFA) 108 (90 Base) MCG/ACT inhaler Inhale into the lungs.  . Apple Cider Vinegar 500 MG TABS Take by mouth.  . Azilsartan-Chlorthalidone 40-12.5 MG TABS Take 1 tablet by mouth every morning.  . budesonide-formoterol (SYMBICORT) 160-4.5 MCG/ACT inhaler Inhale 2 puffs into the lungs 2 (two) times daily.  . cetirizine (ZYRTEC) 10 MG tablet Take by mouth.  Marland Kitchen glucose blood (PRECISION QID TEST) test strip Check sugars 1-2 times daily. 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 ipratropium (ATROVENT) 0.06 % nasal spray Place 2 sprays into the nose 4 (four) times daily.  . Lancets Misc. (UNISTIK 2 NORMAL) MISC Use to monitor blood sugar  . nystatin cream (MYCOSTATIN) Apply bid to intertrigo.  Glory Rosebush DELICA LANCETS FINE MISC USE TO CHECK BLOOD SUGAR 1-2 TIMES DAILY  . Pitavastatin Calcium (LIVALO) 1 MG TABS Take 0.5 mg by mouth.  . sitaGLIPtin (JANUVIA) 25 MG tablet Take 25 mg by mouth daily.  Marland Kitchen triamcinolone cream (KENALOG) 0.1 % Apply topically.  Marland Kitchen VITAMIN D PO Take 5,000 Units by mouth daily.   . [DISCONTINUED] Azilsartan-Chlorthalidone 40-12.5 MG TABS Take 0.5 tablets by mouth every morning.   Allergies  Allergen Reactions  . Alendronate Other (See Comments)    Stabbing pain in breast  . Alprazolam Other (See Comments)    Made anxious  . Amlodipine Other (See Comments)    Hives, [place] out, Black spots on face, Did not keep blood pressure down.   . Atorvastatin Other (See Comments)  . Benzalkonium Chloride Other (See Comments)    Made skin blister   . Clindamycin Other (See Comments)    Pt states that pill that is blue and red she cannot take due feeling like she was on edge  . Clonazepam Other (See Comments)    Headache   . Clonidine Other (See Comments)    Hurt chest    . Escitalopram Oxalate Other (See Comments)    Made anxious    . Gabapentin Other (See Comments)    A bad headache    . Hydrocodone-Acetaminophen Other (See Comments)    Made pain worse  . Iodine Other (See  Comments)  Allergic to shell fish and x-ray dye   . Labetalol Other (See Comments)    Felt like veins are on fire   . Levofloxacin Other (See Comments)    Made me shake  . Lisinopril-Hydrochlorothiazide Other (See Comments)    Was in a blue and pale color   . Livalo [Pitavastatin]     Rash and nausea   . Lorazepam Other (See Comments)    Formula changed   . Losartan Potassium Other (See Comments)    Blood pressure    . Losartan Potassium-Hctz Other (See Comments)    Could not feel arms  . Meloxicam Other (See Comments)    Fatal blood pressure problems will cause strokes   . Metformin Nausea And Vomiting  . Metoclopramide Other (See Comments)    No air was moving, anxious    . Oxcarbazepine Other (See Comments)    Felt like she was chocking    . Penicillins   . Potassium Citrate   . Pravastatin Other (See Comments)  . Rosuvastatin Other (See Comments)  . Simvastatin Other (See Comments)  . Sitagliptin Other (See Comments)    "Made toes feel weird"  . Sodium Citrate Other (See Comments)    Teeth broke off   . Spironolactone     Breast pain    . Sulfa Antibiotics Other (See Comments)  . Zetia [Ezetimibe]     Muscle aches    Recent Results (from the past 2160 hour(s))  Lipase     Status: Abnormal   Collection Time: 02/21/19 11:34 AM  Result Value Ref Range   Lipase 8.0 (L) 11.0 - 59.0 U/L  Vitamin D (25 hydroxy)     Status: None   Collection Time: 02/21/19 11:34 AM  Result Value Ref Range   VITD 69.75 30.00 - 100.00 ng/mL  Urinalysis, Routine w reflex microscopic     Status: None   Collection Time: 02/21/19 11:34 AM  Result Value Ref Range   Color, Urine YELLOW YELLOW   APPearance CLEAR CLEAR   Specific Gravity, Urine 1.010 1.001 - 1.03   pH 8.0 5.0 - 8.0   Glucose, UA NEGATIVE NEGATIVE   Bilirubin Urine NEGATIVE NEGATIVE   Ketones, ur NEGATIVE NEGATIVE   Hgb urine dipstick NEGATIVE NEGATIVE   Protein, ur NEGATIVE NEGATIVE   Nitrite NEGATIVE NEGATIVE    Leukocytes,Ua NEGATIVE NEGATIVE  TSH     Status: None   Collection Time: 02/21/19 11:34 AM  Result Value Ref Range   TSH 1.10 0.35 - 4.50 uIU/mL  Hemoglobin A1c     Status: None   Collection Time: 02/21/19 11:34 AM  Result Value Ref Range   Hgb A1c MFr Bld 6.5 4.6 - 6.5 %    Comment: Glycemic Control Guidelines for People with Diabetes:Non Diabetic:  <6%Goal of Therapy: <7%Additional Action Suggested:  >8%   Lipid panel     Status: Abnormal   Collection Time: 02/21/19 11:34 AM  Result Value Ref Range   Cholesterol 267 (H) 0 - 200 mg/dL    Comment: ATP III Classification       Desirable:  < 200 mg/dL               Borderline High:  200 - 239 mg/dL          High:  > = 240 mg/dL   Triglycerides 99.0 0.0 - 149.0 mg/dL    Comment: Normal:  <150 mg/dLBorderline High:  150 - 199 mg/dL   HDL 48.40 >39.00 mg/dL  VLDL 19.8 0.0 - 40.0 mg/dL   LDL Cholesterol 199 (H) 0 - 99 mg/dL   Total CHOL/HDL Ratio 6     Comment:                Men          Women1/2 Average Risk     3.4          3.3Average Risk          5.0          4.42X Average Risk          9.6          7.13X Average Risk          15.0          11.0                       NonHDL 218.89     Comment: NOTE:  Non-HDL goal should be 30 mg/dL higher than patient's LDL goal (i.e. LDL goal of < 70 mg/dL, would have non-HDL goal of < 100 mg/dL)  CBC w/Diff     Status: Abnormal   Collection Time: 02/21/19 11:34 AM  Result Value Ref Range   WBC 3.8 (L) 4.0 - 10.5 K/uL   RBC 5.00 3.87 - 5.11 Mil/uL   Hemoglobin 14.4 12.0 - 15.0 g/dL   HCT 43.4 36.0 - 46.0 %   MCV 86.8 78.0 - 100.0 fl   MCHC 33.2 30.0 - 36.0 g/dL   RDW 14.6 11.5 - 15.5 %   Platelets 237.0 150.0 - 400.0 K/uL   Neutrophils Relative % 48.2 43.0 - 77.0 %   Lymphocytes Relative 37.5 12.0 - 46.0 %   Monocytes Relative 11.2 3.0 - 12.0 %   Eosinophils Relative 2.1 0.0 - 5.0 %   Basophils Relative 1.0 0.0 - 3.0 %   Neutro Abs 1.8 1.4 - 7.7 K/uL   Lymphs Abs 1.4 0.7 - 4.0 K/uL    Monocytes Absolute 0.4 0.1 - 1.0 K/uL   Eosinophils Absolute 0.1 0.0 - 0.7 K/uL   Basophils Absolute 0.0 0.0 - 0.1 K/uL  Comprehensive metabolic panel     Status: None   Collection Time: 02/21/19 11:34 AM  Result Value Ref Range   Sodium 141 135 - 145 mEq/L   Potassium 4.1 3.5 - 5.1 mEq/L   Chloride 104 96 - 112 mEq/L   CO2 28 19 - 32 mEq/L   Glucose, Bld 91 70 - 99 mg/dL   BUN 9 6 - 23 mg/dL   Creatinine, Ser 0.87 0.40 - 1.20 mg/dL   Total Bilirubin 0.6 0.2 - 1.2 mg/dL   Alkaline Phosphatase 82 39 - 117 U/L   AST 18 0 - 37 U/L   ALT 12 0 - 35 U/L   Total Protein 7.2 6.0 - 8.3 g/dL   Albumin 4.2 3.5 - 5.2 g/dL   Calcium 9.8 8.4 - 10.5 mg/dL   GFR 77.42 >60.00 mL/min   Objective  Body mass index is 29.99 kg/m. Wt Readings from Last 3 Encounters:  05/22/19 180 lb 3.2 oz (81.7 kg)  02/13/19 180 lb (81.6 kg)  10/04/18 193 lb 3.2 oz (87.6 kg)   Temp Readings from Last 3 Encounters:  05/22/19 98 F (36.7 C) (Skin)  10/04/18 98.5 F (36.9 C) (Oral)  07/05/18 97.9 F (36.6 C) (Oral)   BP Readings from Last 3 Encounters:  05/22/19 130/88  02/21/19 (!) 186/103  10/04/18 Marland Kitchen)  138/92   Pulse Readings from Last 3 Encounters:  05/22/19 75  02/21/19 83  10/04/18 70    Physical Exam Vitals signs and nursing note reviewed.  Constitutional:      Appearance: Normal appearance. She is well-developed and well-groomed.     Comments: +mask on  HENT:     Head: Normocephalic and atraumatic.  Eyes:     Conjunctiva/sclera: Conjunctivae normal.     Pupils: Pupils are equal, round, and reactive to light.  Cardiovascular:     Rate and Rhythm: Normal rate and regular rhythm.     Heart sounds: Normal heart sounds. No murmur.     Comments: Heart skipping beats chronic per pt   Pulmonary:     Effort: Pulmonary effort is normal.     Breath sounds: Normal breath sounds.  Abdominal:     General: Abdomen is flat. Bowel sounds are normal.     Tenderness: There is abdominal tenderness in  the right upper quadrant.     Comments: RUQ ab pain    Skin:    General: Skin is warm and dry.  Neurological:     General: No focal deficit present.     Mental Status: She is alert and oriented to person, place, and time. Mental status is at baseline.     Gait: Gait normal.  Psychiatric:        Attention and Perception: Attention and perception normal.        Mood and Affect: Mood and affect normal.        Speech: Speech normal.        Behavior: Behavior normal. Behavior is cooperative.        Thought Content: Thought content normal.        Cognition and Memory: Cognition and memory normal.        Judgment: Judgment normal.     Assessment  Plan  Essential hypertension/hld - Plan: Azilsartan-Chlorthalidone 40-12.5 MG  Tabs 1/2 pill  Taking livalo again 0.5 mg qhs  Wants ca ct scan coronaries will contact [facility] cards to schedule  Fasting labs at f/u cmet, lipid, A1c  Multinodular goiter - Plan: Ambulatory referral to Endocrinology Thyroid nodule - Plan: Ambulatory referral to Endocrinology  Referred endocrine in [place] has not heart about referral   GS Pending surgery referral   HM Declines flu shot  utd prevnar and pna 23  Rx Tdap given today  Declines shingrix  LMP 07/1997 pap 12/09/14 neg pap neg HPV s/p hysterectomy no h/o abnormal pap 1 ovary intact ? Which one Mammogramfat necrosis right breast 11/14/19and 10/09/18 normal  Colonoscopy 05/18/16 tubular adenoma Dr. Jerene Pitch GI f/u in 5 years and FH colon cancer in dad DEXA 05/31/16 osteopenia consider repeat in 3-5 years vit D normal85.5 07/07/17 Hep C neg 07/07/17  Never smoker h/o 2nd hand exposure  Provider: Dr. Olivia Mackie McLean-Scocuzza-Internal Medicine

## 2019-05-22 NOTE — Patient Instructions (Addendum)
Cardiology - [facility], [facility]  [place]  [place], [place] [address]  [phone]  [address] [place], [place]  [place],  [address]  [phone]  [phone] (Fax)    Call CT calcium score of coronary arteries   [facility] [place] [facility] (gallbladder)  [phone] [address]  [place]  Dr. Altheimer [phone] [place]

## 2019-05-23 ENCOUNTER — Telehealth: Payer: Self-pay | Admitting: Internal Medicine

## 2019-05-23 DIAGNOSIS — R42 Dizziness and giddiness: Secondary | ICD-10-CM | POA: Diagnosis not present

## 2019-05-23 DIAGNOSIS — R519 Headache, unspecified: Secondary | ICD-10-CM | POA: Diagnosis not present

## 2019-05-23 DIAGNOSIS — I493 Ventricular premature depolarization: Secondary | ICD-10-CM | POA: Diagnosis not present

## 2019-05-23 NOTE — Telephone Encounter (Signed)
I called pt and left a vm to call ofc to schedule Return for fasting labs with am appt 08/2019 .

## 2019-05-28 DIAGNOSIS — M4802 Spinal stenosis, cervical region: Secondary | ICD-10-CM | POA: Diagnosis not present

## 2019-05-28 DIAGNOSIS — R2681 Unsteadiness on feet: Secondary | ICD-10-CM | POA: Diagnosis not present

## 2019-05-28 DIAGNOSIS — H5461 Unqualified visual loss, right eye, normal vision left eye: Secondary | ICD-10-CM | POA: Diagnosis not present

## 2019-05-28 DIAGNOSIS — M316 Other giant cell arteritis: Secondary | ICD-10-CM | POA: Diagnosis not present

## 2019-05-28 DIAGNOSIS — G43909 Migraine, unspecified, not intractable, without status migrainosus: Secondary | ICD-10-CM | POA: Diagnosis not present

## 2019-05-28 DIAGNOSIS — U071 COVID-19: Secondary | ICD-10-CM | POA: Diagnosis not present

## 2019-05-28 DIAGNOSIS — R269 Unspecified abnormalities of gait and mobility: Secondary | ICD-10-CM | POA: Diagnosis not present

## 2019-05-28 DIAGNOSIS — R531 Weakness: Secondary | ICD-10-CM | POA: Diagnosis not present

## 2019-05-28 DIAGNOSIS — J449 Chronic obstructive pulmonary disease, unspecified: Secondary | ICD-10-CM | POA: Diagnosis not present

## 2019-05-28 DIAGNOSIS — E785 Hyperlipidemia, unspecified: Secondary | ICD-10-CM | POA: Diagnosis not present

## 2019-05-28 DIAGNOSIS — D649 Anemia, unspecified: Secondary | ICD-10-CM | POA: Diagnosis not present

## 2019-05-28 DIAGNOSIS — I10 Essential (primary) hypertension: Secondary | ICD-10-CM | POA: Diagnosis not present

## 2019-05-28 DIAGNOSIS — I160 Hypertensive urgency: Secondary | ICD-10-CM | POA: Diagnosis not present

## 2019-05-28 DIAGNOSIS — E119 Type 2 diabetes mellitus without complications: Secondary | ICD-10-CM | POA: Diagnosis not present

## 2019-05-28 DIAGNOSIS — R2689 Other abnormalities of gait and mobility: Secondary | ICD-10-CM | POA: Diagnosis not present

## 2019-05-28 DIAGNOSIS — M6281 Muscle weakness (generalized): Secondary | ICD-10-CM | POA: Diagnosis not present

## 2019-05-28 DIAGNOSIS — H3411 Central retinal artery occlusion, right eye: Secondary | ICD-10-CM | POA: Diagnosis not present

## 2019-05-30 ENCOUNTER — Telehealth: Payer: Self-pay

## 2019-05-30 NOTE — Telephone Encounter (Signed)
Lab appointment has been scheduled 

## 2019-05-30 NOTE — Telephone Encounter (Signed)
Copied from [place] [phone]. Topic: General - Other >> May 30, 2019  2:15 PM Leward Quan A wrote: Reason for CRM: Patient called requesting to speak to [facility] asking about appointment in January. Can be reached at Ph#  [phone]

## 2019-05-30 NOTE — Telephone Encounter (Signed)
Patient needs fasting lab for appointment in December. Please advise.

## 2019-05-31 ENCOUNTER — Other Ambulatory Visit: Payer: Self-pay | Admitting: Internal Medicine

## 2019-05-31 DIAGNOSIS — E119 Type 2 diabetes mellitus without complications: Secondary | ICD-10-CM

## 2019-05-31 DIAGNOSIS — J449 Chronic obstructive pulmonary disease, unspecified: Secondary | ICD-10-CM | POA: Diagnosis not present

## 2019-05-31 DIAGNOSIS — E871 Hypo-osmolality and hyponatremia: Secondary | ICD-10-CM

## 2019-05-31 DIAGNOSIS — M316 Other giant cell arteritis: Secondary | ICD-10-CM | POA: Diagnosis not present

## 2019-05-31 DIAGNOSIS — H3411 Central retinal artery occlusion, right eye: Secondary | ICD-10-CM | POA: Diagnosis not present

## 2019-05-31 DIAGNOSIS — I10 Essential (primary) hypertension: Secondary | ICD-10-CM

## 2019-05-31 NOTE — Telephone Encounter (Signed)
Appt sch 07/10/19  She does not have to be fasting but will need urine   TMS

## 2019-06-04 NOTE — Telephone Encounter (Signed)
Unable to leave message for patient to return call back, phone kept ringing. PEC may give information.  

## 2019-06-16 DIAGNOSIS — Z7902 Long term (current) use of antithrombotics/antiplatelets: Secondary | ICD-10-CM | POA: Diagnosis not present

## 2019-06-16 DIAGNOSIS — Z79899 Other long term (current) drug therapy: Secondary | ICD-10-CM | POA: Diagnosis not present

## 2019-06-16 DIAGNOSIS — H3411 Central retinal artery occlusion, right eye: Secondary | ICD-10-CM | POA: Diagnosis not present

## 2019-06-16 DIAGNOSIS — E119 Type 2 diabetes mellitus without complications: Secondary | ICD-10-CM | POA: Diagnosis not present

## 2019-06-16 DIAGNOSIS — I10 Essential (primary) hypertension: Secondary | ICD-10-CM | POA: Diagnosis not present

## 2019-06-16 DIAGNOSIS — Z9181 History of falling: Secondary | ICD-10-CM | POA: Diagnosis not present

## 2019-06-16 DIAGNOSIS — H04123 Dry eye syndrome of bilateral lacrimal glands: Secondary | ICD-10-CM | POA: Diagnosis not present

## 2019-06-16 DIAGNOSIS — M316 Other giant cell arteritis: Secondary | ICD-10-CM | POA: Diagnosis not present

## 2019-06-16 DIAGNOSIS — K219 Gastro-esophageal reflux disease without esophagitis: Secondary | ICD-10-CM | POA: Diagnosis not present

## 2019-06-16 DIAGNOSIS — E7800 Pure hypercholesterolemia, unspecified: Secondary | ICD-10-CM | POA: Diagnosis not present

## 2019-06-16 DIAGNOSIS — J449 Chronic obstructive pulmonary disease, unspecified: Secondary | ICD-10-CM | POA: Diagnosis not present

## 2019-06-17 DIAGNOSIS — H2513 Age-related nuclear cataract, bilateral: Secondary | ICD-10-CM | POA: Diagnosis not present

## 2019-06-17 DIAGNOSIS — H3411 Central retinal artery occlusion, right eye: Secondary | ICD-10-CM | POA: Diagnosis not present

## 2019-06-17 DIAGNOSIS — H25013 Cortical age-related cataract, bilateral: Secondary | ICD-10-CM | POA: Diagnosis not present

## 2019-06-18 DIAGNOSIS — I10 Essential (primary) hypertension: Secondary | ICD-10-CM | POA: Diagnosis not present

## 2019-06-18 DIAGNOSIS — K219 Gastro-esophageal reflux disease without esophagitis: Secondary | ICD-10-CM | POA: Diagnosis not present

## 2019-06-18 DIAGNOSIS — H04123 Dry eye syndrome of bilateral lacrimal glands: Secondary | ICD-10-CM | POA: Diagnosis not present

## 2019-06-18 DIAGNOSIS — E119 Type 2 diabetes mellitus without complications: Secondary | ICD-10-CM | POA: Diagnosis not present

## 2019-06-18 DIAGNOSIS — H3411 Central retinal artery occlusion, right eye: Secondary | ICD-10-CM | POA: Diagnosis not present

## 2019-06-18 DIAGNOSIS — Z9181 History of falling: Secondary | ICD-10-CM | POA: Diagnosis not present

## 2019-06-18 DIAGNOSIS — Z79899 Other long term (current) drug therapy: Secondary | ICD-10-CM | POA: Diagnosis not present

## 2019-06-18 DIAGNOSIS — M316 Other giant cell arteritis: Secondary | ICD-10-CM | POA: Diagnosis not present

## 2019-06-18 DIAGNOSIS — Z7902 Long term (current) use of antithrombotics/antiplatelets: Secondary | ICD-10-CM | POA: Diagnosis not present

## 2019-06-18 DIAGNOSIS — E7800 Pure hypercholesterolemia, unspecified: Secondary | ICD-10-CM | POA: Diagnosis not present

## 2019-06-18 DIAGNOSIS — J449 Chronic obstructive pulmonary disease, unspecified: Secondary | ICD-10-CM | POA: Diagnosis not present

## 2019-06-20 ENCOUNTER — Telehealth: Payer: Self-pay | Admitting: Internal Medicine

## 2019-06-20 NOTE — Telephone Encounter (Signed)
Patient was seen at [facility] for a mini stroke discharged on 06/04/2019 patient declined appointment due to transportation issues, please advise. Patient was prescribed clopidogrel and hydralazine and would like to discuss if PCP would like her to continue  [id number] - [place], [place] - [address]

## 2019-06-21 ENCOUNTER — Telehealth: Payer: Self-pay | Admitting: Internal Medicine

## 2019-06-21 DIAGNOSIS — M316 Other giant cell arteritis: Secondary | ICD-10-CM | POA: Diagnosis not present

## 2019-06-21 DIAGNOSIS — I10 Essential (primary) hypertension: Secondary | ICD-10-CM | POA: Diagnosis not present

## 2019-06-21 DIAGNOSIS — H3411 Central retinal artery occlusion, right eye: Secondary | ICD-10-CM | POA: Diagnosis not present

## 2019-06-21 DIAGNOSIS — Z9181 History of falling: Secondary | ICD-10-CM | POA: Diagnosis not present

## 2019-06-21 DIAGNOSIS — K219 Gastro-esophageal reflux disease without esophagitis: Secondary | ICD-10-CM | POA: Diagnosis not present

## 2019-06-21 DIAGNOSIS — Z79899 Other long term (current) drug therapy: Secondary | ICD-10-CM | POA: Diagnosis not present

## 2019-06-21 DIAGNOSIS — J449 Chronic obstructive pulmonary disease, unspecified: Secondary | ICD-10-CM | POA: Diagnosis not present

## 2019-06-21 DIAGNOSIS — H04123 Dry eye syndrome of bilateral lacrimal glands: Secondary | ICD-10-CM | POA: Diagnosis not present

## 2019-06-21 DIAGNOSIS — E7800 Pure hypercholesterolemia, unspecified: Secondary | ICD-10-CM | POA: Diagnosis not present

## 2019-06-21 DIAGNOSIS — E119 Type 2 diabetes mellitus without complications: Secondary | ICD-10-CM | POA: Diagnosis not present

## 2019-06-21 DIAGNOSIS — Z7902 Long term (current) use of antithrombotics/antiplatelets: Secondary | ICD-10-CM | POA: Diagnosis not present

## 2019-06-21 NOTE — Telephone Encounter (Signed)
Called and left voice mail for [place] at [facility].  Gave verbal orders for OT as requested for 1x week for 4 weeks.

## 2019-06-21 NOTE — Telephone Encounter (Signed)
Home Health Verbal Orders - Caller/Agency: [facility] Number: [phone] (vm) Requesting OT/PT/Skilled Nursing/Social Work/Speech Therapy: OT Frequency:  1x a week for 4 week

## 2019-06-23 ENCOUNTER — Other Ambulatory Visit: Payer: Self-pay | Admitting: Internal Medicine

## 2019-06-23 ENCOUNTER — Encounter: Payer: Self-pay | Admitting: Internal Medicine

## 2019-06-23 DIAGNOSIS — I700 Atherosclerosis of aorta: Secondary | ICD-10-CM

## 2019-06-23 DIAGNOSIS — I7090 Unspecified atherosclerosis: Secondary | ICD-10-CM

## 2019-06-23 DIAGNOSIS — I10 Essential (primary) hypertension: Secondary | ICD-10-CM

## 2019-06-23 DIAGNOSIS — I6529 Occlusion and stenosis of unspecified carotid artery: Secondary | ICD-10-CM | POA: Insufficient documentation

## 2019-06-23 DIAGNOSIS — R9089 Other abnormal findings on diagnostic imaging of central nervous system: Secondary | ICD-10-CM | POA: Insufficient documentation

## 2019-06-23 MED ORDER — HYDRALAZINE HCL 50 MG PO TABS: 50.0000 mg | ORAL_TABLET | Freq: Three times a day (TID) | ORAL | 3 refills | Status: DC

## 2019-06-23 MED ORDER — CLOPIDOGREL BISULFATE 75 MG PO TABS: 75.0000 mg | ORAL_TABLET | Freq: Every day | ORAL | 0 refills | Status: AC

## 2019-06-23 NOTE — Telephone Encounter (Signed)
Neurology - [address] [place] [address]  [place]  [place], [place] [address]  [phone]  Teresa Coombs, DNP APRN  [address] [place], [place] [address]  [phone]  [phone] (Fax)     She needs appt with [facility] neurology please call and schedule appt give # above  I do think she needs to be on plavix would continue this until f/u neurology  What has her blood pressure been?  She needs hydralazine 50 mg 3x per day if blood pressure is not <130/<80  Is she tolerating this medication?  She will need to continue her other blood pressure medications as well   [place]

## 2019-06-24 NOTE — Telephone Encounter (Signed)
Neurology - [address], [address] [place]  [place], [place] [address]  [phone]  Teresa Coombs, DNP APRN  [address] [place], [place] [address]  [phone]  [phone] (Fax)     She needs appt with [facility] neurology please call and schedule appt give # above  I do think she needs to be on plavix would continue this until f/u neurology  What has her blood pressure been?  She needs hydralazine 50 mg 3x per day if blood pressure is not <130/<80  Is she tolerating this medication?  She will need to continue her other blood pressure medications as well   OK for PEC to advise.  Yousof Alderman,cma

## 2019-06-25 DIAGNOSIS — E7800 Pure hypercholesterolemia, unspecified: Secondary | ICD-10-CM | POA: Diagnosis not present

## 2019-06-25 DIAGNOSIS — H04123 Dry eye syndrome of bilateral lacrimal glands: Secondary | ICD-10-CM | POA: Diagnosis not present

## 2019-06-25 DIAGNOSIS — E119 Type 2 diabetes mellitus without complications: Secondary | ICD-10-CM | POA: Diagnosis not present

## 2019-06-25 DIAGNOSIS — Z7902 Long term (current) use of antithrombotics/antiplatelets: Secondary | ICD-10-CM | POA: Diagnosis not present

## 2019-06-25 DIAGNOSIS — J449 Chronic obstructive pulmonary disease, unspecified: Secondary | ICD-10-CM | POA: Diagnosis not present

## 2019-06-25 DIAGNOSIS — Z9181 History of falling: Secondary | ICD-10-CM | POA: Diagnosis not present

## 2019-06-25 DIAGNOSIS — M316 Other giant cell arteritis: Secondary | ICD-10-CM | POA: Diagnosis not present

## 2019-06-25 DIAGNOSIS — H3411 Central retinal artery occlusion, right eye: Secondary | ICD-10-CM | POA: Diagnosis not present

## 2019-06-25 DIAGNOSIS — I10 Essential (primary) hypertension: Secondary | ICD-10-CM | POA: Diagnosis not present

## 2019-06-25 DIAGNOSIS — K219 Gastro-esophageal reflux disease without esophagitis: Secondary | ICD-10-CM | POA: Diagnosis not present

## 2019-06-25 DIAGNOSIS — Z79899 Other long term (current) drug therapy: Secondary | ICD-10-CM | POA: Diagnosis not present

## 2019-06-26 DIAGNOSIS — E7800 Pure hypercholesterolemia, unspecified: Secondary | ICD-10-CM | POA: Diagnosis not present

## 2019-06-26 DIAGNOSIS — H3411 Central retinal artery occlusion, right eye: Secondary | ICD-10-CM | POA: Diagnosis not present

## 2019-06-26 DIAGNOSIS — Z7902 Long term (current) use of antithrombotics/antiplatelets: Secondary | ICD-10-CM | POA: Diagnosis not present

## 2019-06-26 DIAGNOSIS — J449 Chronic obstructive pulmonary disease, unspecified: Secondary | ICD-10-CM | POA: Diagnosis not present

## 2019-06-26 DIAGNOSIS — I10 Essential (primary) hypertension: Secondary | ICD-10-CM | POA: Diagnosis not present

## 2019-06-26 DIAGNOSIS — Z9181 History of falling: Secondary | ICD-10-CM | POA: Diagnosis not present

## 2019-06-26 DIAGNOSIS — M316 Other giant cell arteritis: Secondary | ICD-10-CM | POA: Diagnosis not present

## 2019-06-26 DIAGNOSIS — Z79899 Other long term (current) drug therapy: Secondary | ICD-10-CM | POA: Diagnosis not present

## 2019-06-26 DIAGNOSIS — K219 Gastro-esophageal reflux disease without esophagitis: Secondary | ICD-10-CM | POA: Diagnosis not present

## 2019-06-26 DIAGNOSIS — H04123 Dry eye syndrome of bilateral lacrimal glands: Secondary | ICD-10-CM | POA: Diagnosis not present

## 2019-06-26 DIAGNOSIS — E119 Type 2 diabetes mellitus without complications: Secondary | ICD-10-CM | POA: Diagnosis not present

## 2019-07-01 DIAGNOSIS — Z79899 Other long term (current) drug therapy: Secondary | ICD-10-CM | POA: Diagnosis not present

## 2019-07-01 DIAGNOSIS — I10 Essential (primary) hypertension: Secondary | ICD-10-CM | POA: Diagnosis not present

## 2019-07-01 DIAGNOSIS — Z7902 Long term (current) use of antithrombotics/antiplatelets: Secondary | ICD-10-CM | POA: Diagnosis not present

## 2019-07-01 DIAGNOSIS — H04123 Dry eye syndrome of bilateral lacrimal glands: Secondary | ICD-10-CM | POA: Diagnosis not present

## 2019-07-01 DIAGNOSIS — E7800 Pure hypercholesterolemia, unspecified: Secondary | ICD-10-CM | POA: Diagnosis not present

## 2019-07-01 DIAGNOSIS — K219 Gastro-esophageal reflux disease without esophagitis: Secondary | ICD-10-CM | POA: Diagnosis not present

## 2019-07-01 DIAGNOSIS — E119 Type 2 diabetes mellitus without complications: Secondary | ICD-10-CM | POA: Diagnosis not present

## 2019-07-01 DIAGNOSIS — H3411 Central retinal artery occlusion, right eye: Secondary | ICD-10-CM | POA: Diagnosis not present

## 2019-07-01 DIAGNOSIS — J449 Chronic obstructive pulmonary disease, unspecified: Secondary | ICD-10-CM | POA: Diagnosis not present

## 2019-07-01 DIAGNOSIS — Z9181 History of falling: Secondary | ICD-10-CM | POA: Diagnosis not present

## 2019-07-01 DIAGNOSIS — M316 Other giant cell arteritis: Secondary | ICD-10-CM | POA: Diagnosis not present

## 2019-07-03 DIAGNOSIS — H04123 Dry eye syndrome of bilateral lacrimal glands: Secondary | ICD-10-CM | POA: Diagnosis not present

## 2019-07-03 DIAGNOSIS — E7800 Pure hypercholesterolemia, unspecified: Secondary | ICD-10-CM | POA: Diagnosis not present

## 2019-07-03 DIAGNOSIS — J449 Chronic obstructive pulmonary disease, unspecified: Secondary | ICD-10-CM | POA: Diagnosis not present

## 2019-07-03 DIAGNOSIS — I10 Essential (primary) hypertension: Secondary | ICD-10-CM | POA: Diagnosis not present

## 2019-07-03 DIAGNOSIS — Z9181 History of falling: Secondary | ICD-10-CM | POA: Diagnosis not present

## 2019-07-03 DIAGNOSIS — M316 Other giant cell arteritis: Secondary | ICD-10-CM | POA: Diagnosis not present

## 2019-07-03 DIAGNOSIS — H3411 Central retinal artery occlusion, right eye: Secondary | ICD-10-CM | POA: Diagnosis not present

## 2019-07-03 DIAGNOSIS — Z7902 Long term (current) use of antithrombotics/antiplatelets: Secondary | ICD-10-CM | POA: Diagnosis not present

## 2019-07-03 DIAGNOSIS — E119 Type 2 diabetes mellitus without complications: Secondary | ICD-10-CM | POA: Diagnosis not present

## 2019-07-03 DIAGNOSIS — K219 Gastro-esophageal reflux disease without esophagitis: Secondary | ICD-10-CM | POA: Diagnosis not present

## 2019-07-03 DIAGNOSIS — Z79899 Other long term (current) drug therapy: Secondary | ICD-10-CM | POA: Diagnosis not present

## 2019-07-04 DIAGNOSIS — H3411 Central retinal artery occlusion, right eye: Secondary | ICD-10-CM | POA: Diagnosis not present

## 2019-07-05 DIAGNOSIS — Z79899 Other long term (current) drug therapy: Secondary | ICD-10-CM | POA: Diagnosis not present

## 2019-07-05 DIAGNOSIS — K219 Gastro-esophageal reflux disease without esophagitis: Secondary | ICD-10-CM | POA: Diagnosis not present

## 2019-07-05 DIAGNOSIS — I10 Essential (primary) hypertension: Secondary | ICD-10-CM | POA: Diagnosis not present

## 2019-07-05 DIAGNOSIS — M316 Other giant cell arteritis: Secondary | ICD-10-CM | POA: Diagnosis not present

## 2019-07-05 DIAGNOSIS — H3411 Central retinal artery occlusion, right eye: Secondary | ICD-10-CM | POA: Diagnosis not present

## 2019-07-05 DIAGNOSIS — J449 Chronic obstructive pulmonary disease, unspecified: Secondary | ICD-10-CM | POA: Diagnosis not present

## 2019-07-05 DIAGNOSIS — E7800 Pure hypercholesterolemia, unspecified: Secondary | ICD-10-CM | POA: Diagnosis not present

## 2019-07-05 DIAGNOSIS — H04123 Dry eye syndrome of bilateral lacrimal glands: Secondary | ICD-10-CM | POA: Diagnosis not present

## 2019-07-05 DIAGNOSIS — E119 Type 2 diabetes mellitus without complications: Secondary | ICD-10-CM | POA: Diagnosis not present

## 2019-07-05 DIAGNOSIS — Z7902 Long term (current) use of antithrombotics/antiplatelets: Secondary | ICD-10-CM | POA: Diagnosis not present

## 2019-07-05 DIAGNOSIS — Z9181 History of falling: Secondary | ICD-10-CM | POA: Diagnosis not present

## 2019-07-10 ENCOUNTER — Other Ambulatory Visit: Payer: Medicare Other

## 2019-07-11 DIAGNOSIS — K219 Gastro-esophageal reflux disease without esophagitis: Secondary | ICD-10-CM | POA: Diagnosis not present

## 2019-07-11 DIAGNOSIS — J449 Chronic obstructive pulmonary disease, unspecified: Secondary | ICD-10-CM | POA: Diagnosis not present

## 2019-07-11 DIAGNOSIS — E7800 Pure hypercholesterolemia, unspecified: Secondary | ICD-10-CM | POA: Diagnosis not present

## 2019-07-11 DIAGNOSIS — Z9181 History of falling: Secondary | ICD-10-CM | POA: Diagnosis not present

## 2019-07-11 DIAGNOSIS — E119 Type 2 diabetes mellitus without complications: Secondary | ICD-10-CM | POA: Diagnosis not present

## 2019-07-11 DIAGNOSIS — H04123 Dry eye syndrome of bilateral lacrimal glands: Secondary | ICD-10-CM | POA: Diagnosis not present

## 2019-07-11 DIAGNOSIS — Z79899 Other long term (current) drug therapy: Secondary | ICD-10-CM | POA: Diagnosis not present

## 2019-07-11 DIAGNOSIS — Z7902 Long term (current) use of antithrombotics/antiplatelets: Secondary | ICD-10-CM | POA: Diagnosis not present

## 2019-07-11 DIAGNOSIS — H3411 Central retinal artery occlusion, right eye: Secondary | ICD-10-CM | POA: Diagnosis not present

## 2019-07-11 DIAGNOSIS — M316 Other giant cell arteritis: Secondary | ICD-10-CM | POA: Diagnosis not present

## 2019-07-11 DIAGNOSIS — I10 Essential (primary) hypertension: Secondary | ICD-10-CM | POA: Diagnosis not present

## 2019-07-12 ENCOUNTER — Ambulatory Visit: Payer: Self-pay

## 2019-07-12 DIAGNOSIS — R700 Elevated erythrocyte sedimentation rate: Secondary | ICD-10-CM | POA: Diagnosis not present

## 2019-07-12 DIAGNOSIS — K8020 Calculus of gallbladder without cholecystitis without obstruction: Secondary | ICD-10-CM | POA: Diagnosis not present

## 2019-07-12 DIAGNOSIS — R7982 Elevated C-reactive protein (CRP): Secondary | ICD-10-CM | POA: Diagnosis not present

## 2019-07-12 DIAGNOSIS — Z7984 Long term (current) use of oral hypoglycemic drugs: Secondary | ICD-10-CM | POA: Diagnosis not present

## 2019-07-12 DIAGNOSIS — H25813 Combined forms of age-related cataract, bilateral: Secondary | ICD-10-CM | POA: Insufficient documentation

## 2019-07-12 DIAGNOSIS — E119 Type 2 diabetes mellitus without complications: Secondary | ICD-10-CM | POA: Diagnosis not present

## 2019-07-12 DIAGNOSIS — H3411 Central retinal artery occlusion, right eye: Secondary | ICD-10-CM | POA: Diagnosis not present

## 2019-07-12 DIAGNOSIS — Z8673 Personal history of transient ischemic attack (TIA), and cerebral infarction without residual deficits: Secondary | ICD-10-CM | POA: Diagnosis not present

## 2019-07-12 DIAGNOSIS — I10 Essential (primary) hypertension: Secondary | ICD-10-CM | POA: Diagnosis not present

## 2019-07-12 DIAGNOSIS — R1011 Right upper quadrant pain: Secondary | ICD-10-CM | POA: Diagnosis not present

## 2019-07-12 NOTE — Telephone Encounter (Signed)
Noted  TMS 

## 2019-07-12 NOTE — Telephone Encounter (Signed)
Pt. Called to report high blood pressure readings.  Today BP 180/101 @ 2:35 PM and 192/115 @ 2:55 PM.  C/o increase in right upper quadrant abdominal pain in past 2 days.  Stated that the pain in right upper abd. Radiates up into the right chest and through to her back.  Reported she was supposed to have her Gallbladder removed last May or June.  Stated the pain has gotten much worse.  Stated "it feels like a knife is sticking in me."  Rated pain at 8-9/10.   Denied fever.  Denied sweating.  C/o some nausea today; felt like she could vomit, but has not vomited.  Advised she should go to ER with current symptoms.  Pt. Stated she will call her grandaughter for transportation.   Reason for Disposition . [1] SEVERE pain (e.g., excruciating) AND [2] present > 1 hour . [id number] Systolic BP  >= 0000000 OR Diastolic >= [id number] AND [id number] cardiac or neurologic symptoms (e.g., chest pain, difficulty breathing, unsteady gait, blurred vision)  Answer Assessment - Initial Assessment Questions 1. BLOOD PRESSURE: "What is the blood pressure?" "Did you take at least two measurements 5 minutes apart?"     180/101 @ 2:35 PM; 192/115 @ 2:55 PM.  2. ONSET: "When did you take your blood pressure?"     See above 3. HOW: "How did you obtain the blood pressure?" (e.g., visiting nurse, automatic home BP monitor)     Home BP monitor 4. HISTORY: "Do you have a history of high blood pressure?"     yes 5. MEDICATIONS: "Are you taking any medications for blood pressure?" "Have you missed any doses recently?"     Taking Hydralazine 50 mg TID and Azilsartan-Chlorthalidone 40 mg/ 12.5 mg qd.   6. OTHER SYMPTOMS: "Do you have any symptoms?" (e.g., headache, chest pain, blurred vision, difficulty breathing, weakness)     Reported right upper quad abd. Pain radiated up to right chest and back; c/o some shortness of breath; denied weakness of extremities, or speech difficulty.   7. PREGNANCY: "Is there any chance you are pregnant?" "When was your  last menstrual period?"     N/a  Answer Assessment - Initial Assessment Questions 1. LOCATION: "Where does it hurt?"      Right upper quadrant 2. RADIATION: "Does the pain shoot anywhere else?" (e.g., chest, back)    Radiates up to right chest and right back 3. ONSET: "When did the pain begin?" (e.g., minutes, hours or days ago)      Past 2 days 4. SUDDEN: "Gradual or sudden onset?"     Worse past 2 days  5. PATTERN "Does the pain come and go, or is it constant?"    - If constant: "Is it getting better, staying the same, or worsening?"      (Note: Constant means the pain never goes away completely; most serious pain is constant and it progresses)     - If intermittent: "How long does it last?" "Do you have pain now?"     (Note: Intermittent means the pain goes away completely between bouts)     *No Answer* 6. SEVERITY: "How bad is the pain?"  (e.g., Scale 1-10; mild, moderate, or severe)    - MILD (1-3): doesn't interfere with normal activities, abdomen soft and not tender to touch     - MODERATE (4-7): interferes with normal activities or awakens from sleep, tender to touch     - SEVERE (8-10): excruciating pain, doubled over, unable to do  any normal activities       Feels like a knife sticking in her; 8-9/10 7. RECURRENT SYMPTOM: "Have you ever had this type of abdominal pain before?" If so, ask: "When was the last time?" and "What happened that time?"      *No Answer* 8. AGGRAVATING FACTORS: "Does anything seem to cause this pain?" (e.g., foods, stress, alcohol)     *No Answer* 9. CARDIAC SYMPTOMS: "Do you have any of the following symptoms: chest pain, difficulty breathing, sweating, nausea?"     Intermittent pain in right chest and back; some shortness of breath.  10. OTHER SYMPTOMS: "Do you have any other symptoms?" (e.g., fever, vomiting, diarrhea)       Nausea today; feels short of breath at this time. 11. PREGNANCY: "Is there any chance you are pregnant?" "When was your last  menstrual period?"       N/a  Protocols used: ABDOMINAL PAIN - UPPER-A-AH, HIGH BLOOD PRESSURE-A-AH

## 2019-07-12 NOTE — Telephone Encounter (Signed)
Called pt. back to confirm she was able to reach grandaughter, to take to her to the ER.  Pt. stated her grandaughter is on her way, and she will go directly to the ER.  Will make PCP aware.

## 2019-07-14 DIAGNOSIS — R2689 Other abnormalities of gait and mobility: Secondary | ICD-10-CM | POA: Diagnosis not present

## 2019-07-14 DIAGNOSIS — M4802 Spinal stenosis, cervical region: Secondary | ICD-10-CM | POA: Diagnosis not present

## 2019-07-18 DIAGNOSIS — K2100 Gastro-esophageal reflux disease with esophagitis, without bleeding: Secondary | ICD-10-CM | POA: Diagnosis not present

## 2019-07-18 DIAGNOSIS — E119 Type 2 diabetes mellitus without complications: Secondary | ICD-10-CM | POA: Diagnosis not present

## 2019-07-18 DIAGNOSIS — E7800 Pure hypercholesterolemia, unspecified: Secondary | ICD-10-CM | POA: Diagnosis not present

## 2019-07-18 DIAGNOSIS — Z8673 Personal history of transient ischemic attack (TIA), and cerebral infarction without residual deficits: Secondary | ICD-10-CM | POA: Diagnosis not present

## 2019-07-18 DIAGNOSIS — I10 Essential (primary) hypertension: Secondary | ICD-10-CM | POA: Diagnosis not present

## 2019-07-25 ENCOUNTER — Other Ambulatory Visit: Payer: Self-pay | Admitting: Internal Medicine

## 2019-07-25 ENCOUNTER — Telehealth: Payer: Self-pay | Admitting: Internal Medicine

## 2019-07-25 DIAGNOSIS — H3411 Central retinal artery occlusion, right eye: Secondary | ICD-10-CM | POA: Insufficient documentation

## 2019-07-25 NOTE — Telephone Encounter (Signed)
Will order carotid US per eye doctor  Schedule [place]?    [place]

## 2019-07-29 NOTE — Telephone Encounter (Signed)
Patient has been notified and it has been scheduled.

## 2019-07-30 ENCOUNTER — Ambulatory Visit (HOSPITAL_COMMUNITY)
Admission: RE | Admit: 2019-07-30 | Discharge: 2019-07-30 | Disposition: A | Discharge status: Home / Self Care | Payer: Medicare Other | Source: Ambulatory Visit | Attending: Internal Medicine | Admitting: Internal Medicine

## 2019-07-30 ENCOUNTER — Other Ambulatory Visit: Payer: Self-pay

## 2019-07-30 DIAGNOSIS — K8020 Calculus of gallbladder without cholecystitis without obstruction: Secondary | ICD-10-CM | POA: Diagnosis not present

## 2019-07-30 DIAGNOSIS — H3411 Central retinal artery occlusion, right eye: Secondary | ICD-10-CM

## 2019-07-30 DIAGNOSIS — Z01812 Encounter for preprocedural laboratory examination: Secondary | ICD-10-CM | POA: Diagnosis not present

## 2019-07-30 DIAGNOSIS — Z8673 Personal history of transient ischemic attack (TIA), and cerebral infarction without residual deficits: Secondary | ICD-10-CM | POA: Diagnosis not present

## 2019-07-31 ENCOUNTER — Encounter: Payer: Self-pay | Admitting: *Deleted

## 2019-08-05 ENCOUNTER — Other Ambulatory Visit: Payer: Medicare Other

## 2019-08-14 DIAGNOSIS — R2689 Other abnormalities of gait and mobility: Secondary | ICD-10-CM | POA: Diagnosis not present

## 2019-08-14 DIAGNOSIS — M4802 Spinal stenosis, cervical region: Secondary | ICD-10-CM | POA: Diagnosis not present

## 2019-08-15 ENCOUNTER — Telehealth: Payer: Self-pay | Admitting: Internal Medicine

## 2019-08-15 NOTE — Telephone Encounter (Signed)
Pt is requesting the following refills; sitaGLIPtin (JANUVIA) 25 MG tablet, budesonide-formoterol (SYMBICORT) 160-4.5 MCG/ACT inhaler

## 2019-08-16 MED ORDER — BUDESONIDE-FORMOTEROL FUMARATE 160-4.5 MCG/ACT IN AERO: 2.0000 | INHALATION_SPRAY | Freq: Two times a day (BID) | RESPIRATORY_TRACT | 6 refills | Status: DC

## 2019-08-16 MED ORDER — SITAGLIPTIN PHOSPHATE 25 MG PO TABS: 25.0000 mg | ORAL_TABLET | Freq: Every day | ORAL | 1 refills | Status: DC

## 2019-08-16 NOTE — Telephone Encounter (Signed)
Refill has been completed.

## 2019-08-20 DIAGNOSIS — Z20822 Contact with and (suspected) exposure to covid-19: Secondary | ICD-10-CM | POA: Diagnosis not present

## 2019-08-20 DIAGNOSIS — Z8673 Personal history of transient ischemic attack (TIA), and cerebral infarction without residual deficits: Secondary | ICD-10-CM | POA: Diagnosis not present

## 2019-08-20 DIAGNOSIS — Z01812 Encounter for preprocedural laboratory examination: Secondary | ICD-10-CM | POA: Diagnosis not present

## 2019-08-24 DIAGNOSIS — M79604 Pain in right leg: Secondary | ICD-10-CM | POA: Diagnosis not present

## 2019-08-24 DIAGNOSIS — K8020 Calculus of gallbladder without cholecystitis without obstruction: Secondary | ICD-10-CM | POA: Diagnosis not present

## 2019-08-24 DIAGNOSIS — E119 Type 2 diabetes mellitus without complications: Secondary | ICD-10-CM | POA: Diagnosis not present

## 2019-08-24 DIAGNOSIS — M79605 Pain in left leg: Secondary | ICD-10-CM | POA: Diagnosis not present

## 2019-08-26 DIAGNOSIS — H3411 Central retinal artery occlusion, right eye: Secondary | ICD-10-CM | POA: Diagnosis not present

## 2019-08-28 DIAGNOSIS — Z01812 Encounter for preprocedural laboratory examination: Secondary | ICD-10-CM | POA: Diagnosis not present

## 2019-08-28 DIAGNOSIS — K8020 Calculus of gallbladder without cholecystitis without obstruction: Secondary | ICD-10-CM | POA: Diagnosis not present

## 2019-08-28 DIAGNOSIS — Z20822 Contact with and (suspected) exposure to covid-19: Secondary | ICD-10-CM | POA: Diagnosis not present

## 2019-08-29 DIAGNOSIS — I471 Supraventricular tachycardia: Secondary | ICD-10-CM | POA: Diagnosis not present

## 2019-08-29 DIAGNOSIS — I493 Ventricular premature depolarization: Secondary | ICD-10-CM | POA: Diagnosis not present

## 2019-09-02 DIAGNOSIS — K8020 Calculus of gallbladder without cholecystitis without obstruction: Secondary | ICD-10-CM | POA: Diagnosis not present

## 2019-09-02 DIAGNOSIS — K8010 Calculus of gallbladder with chronic cholecystitis without obstruction: Secondary | ICD-10-CM | POA: Diagnosis not present

## 2019-09-02 DIAGNOSIS — Z7902 Long term (current) use of antithrombotics/antiplatelets: Secondary | ICD-10-CM | POA: Diagnosis not present

## 2019-09-05 ENCOUNTER — Ambulatory Visit (INDEPENDENT_AMBULATORY_CARE_PROVIDER_SITE_OTHER): Payer: Medicare Other | Admitting: Internal Medicine

## 2019-09-05 ENCOUNTER — Other Ambulatory Visit: Payer: Self-pay

## 2019-09-05 ENCOUNTER — Encounter: Payer: Self-pay | Admitting: Internal Medicine

## 2019-09-05 VITALS — BP 196/99 | Ht 64.0 in | Wt 185.0 lb

## 2019-09-05 DIAGNOSIS — I10 Essential (primary) hypertension: Secondary | ICD-10-CM

## 2019-09-05 DIAGNOSIS — F419 Anxiety disorder, unspecified: Secondary | ICD-10-CM | POA: Diagnosis not present

## 2019-09-05 DIAGNOSIS — H269 Unspecified cataract: Secondary | ICD-10-CM | POA: Diagnosis not present

## 2019-09-05 DIAGNOSIS — I4719 Other supraventricular tachycardia: Secondary | ICD-10-CM

## 2019-09-05 DIAGNOSIS — G4700 Insomnia, unspecified: Secondary | ICD-10-CM

## 2019-09-05 DIAGNOSIS — J4520 Mild intermittent asthma, uncomplicated: Secondary | ICD-10-CM | POA: Diagnosis not present

## 2019-09-05 DIAGNOSIS — H3411 Central retinal artery occlusion, right eye: Secondary | ICD-10-CM

## 2019-09-05 DIAGNOSIS — E119 Type 2 diabetes mellitus without complications: Secondary | ICD-10-CM

## 2019-09-05 DIAGNOSIS — I471 Supraventricular tachycardia: Secondary | ICD-10-CM

## 2019-09-05 MED ORDER — NEBIVOLOL HCL 2.5 MG PO TABS: 2.5000 mg | ORAL_TABLET | Freq: Every day | ORAL | 0 refills | Status: DC

## 2019-09-05 MED ORDER — AZILSARTAN-CHLORTHALIDONE 40-12.5 MG PO TABS: 1.0000 | ORAL_TABLET | Freq: Every morning | ORAL | 3 refills | Status: DC

## 2019-09-05 MED ORDER — BUDESONIDE-FORMOTEROL FUMARATE 160-4.5 MCG/ACT IN AERO: 2.0000 | INHALATION_SPRAY | Freq: Two times a day (BID) | RESPIRATORY_TRACT | 11 refills | Status: DC

## 2019-09-05 MED ORDER — LORAZEPAM 0.5 MG PO TABS: 0.2500 mg | ORAL_TABLET | Freq: Every day | ORAL | 0 refills | Status: DC | PRN

## 2019-09-05 MED ORDER — HYDRALAZINE HCL 50 MG PO TABS: 50.0000 mg | ORAL_TABLET | Freq: Three times a day (TID) | ORAL | 3 refills | Status: DC

## 2019-09-05 NOTE — Progress Notes (Addendum)
Telephone Note  I connected with Marissa King   on 09/05/19 at 10:15 AM EST by telephone and verified that I am speaking with the correct person using two identifiers.  Location patient: home Location provider:work or home office Persons participating in the virtual visit: patient, provider  I discussed the limitations of evaluation and management by telemedicine and the availability of in person appointments. The patient expressed understanding and agreed to proceed.   HPI: 1. Insomnia/anxiety ativan wants to resume did not tolerate generic  2. 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 BP elevated and uncontrolled today on hydralazine 50 tid 6 am 12 pm and 6 pm and Azlisartan-chlorthalidone 40-12.5 taking 0.5 mg bid ive asked her to take 1 pill qd  4. GB removed 09/02/19 Dr. Justus Memory slightly sore 2 incisions in abdomen otherwise doing well  5. zio monitor 08/30/19 with atrial tachycardia f/u cardiology 6. 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 Asthma needs refill symbicort   ROS: See pertinent positives and negatives per HPI.  Past Medical History:  Diagnosis Date  . Allergy   . 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 Asthma    with h/o chronic bronchitis  . Back pain    mid back pain h/ o trauma   . Diabetes mellitus without complication ([place])   . Frequent headaches   . Gastric ulcer   . GERD (gastroesophageal reflux disease)   . History of chicken pox   . Hyperlipidemia   . Hypertension   . Migraines   . Thyroid disease    mng     Past Surgical History:  Procedure Laterality Date  . ABDOMINAL HYSTERECTOMY     DUB 1 ovary intact   . CHOLECYSTECTOMY     09/02/19    Family History  Problem Relation Age of Onset  . Alcohol abuse Mother   . Heart disease Mother        CHF  . Hypertension Mother   .  Hyperlipidemia Mother   . Stroke Mother   . Mental illness Mother   . Alcohol abuse Father   . Cancer Father        pancreatitic   . Arthritis Sister   . COPD Sister   . Depression Sister   . Drug abuse Sister   . Hearing loss Sister   . Heart disease Sister        CHF  . Hyperlipidemia Sister   . Hypertension Sister   . Mental illness Sister   . COPD Brother   . Depression Brother   . Heart disease Brother   . Hyperlipidemia Brother   . Stroke Brother   . Asthma Son   . Arthritis Brother   . Depression Brother   . Heart disease Brother   . Hyperlipidemia Brother   . Mental illness Brother   . Mental illness Brother   . Hyperlipidemia Brother   . Drug abuse Brother   . Diabetes Brother   . Depression Brother   . COPD Brother   . Birth defects Brother   . Arthritis Brother     SOCIAL HX:  2 sons, 4 pregnancies  Brother is Marissa King  Never smoker exposed 2nd hand  No guns  Wears seat belt  Safe  in relationship, widowed lives alone 2 year college ed    Current Outpatient Medications:  .  albuterol (PROVENTIL HFA;VENTOLIN HFA) 108 (90 Base) MCG/ACT inhaler, Inhale into the lungs., Disp: , Rfl:  .  Azilsartan-Chlorthalidone 40-12.5 MG TABS, Take 1 tablet by mouth every morning. For blood, Disp: 90 tablet, Rfl: 3 .  budesonide-formoterol (SYMBICORT) 160-4.5 MCG/ACT inhaler, Inhale 2 puffs into the lungs 2 (two) times daily. Rinse mouth for asthma, Disp: 1 Inhaler, Rfl: 11 .  cetirizine (ZYRTEC) 10 MG tablet, Take by mouth., Disp: , Rfl:  .  clopidogrel (PLAVIX) 75 MG tablet, Take 75 mg by mouth daily., Disp: , Rfl:  .  glucose blood (PRECISION QID TEST) test strip, Check sugars 1-2 times daily. E11.9 Non-insulin dependent, Disp: , Rfl:  .  ipratropium (ATROVENT) 0.06 % nasal spray, Place 2 sprays into the nose 4 (four) times daily., Disp: 15 mL, Rfl: 12 .  Lancets Misc. (UNISTIK 2 NORMAL) MISC, Use to monitor blood sugar, Disp: , Rfl:  .  ONETOUCH DELICA LANCETS  FINE MISC, USE TO CHECK BLOOD SUGAR 1-2 TIMES DAILY, Disp: , Rfl:  .  Pitavastatin Calcium (LIVALO) 1 MG TABS, Take 0.5 mg by mouth., Disp: , Rfl:  .  sitaGLIPtin (JANUVIA) 25 MG tablet, Take 1 tablet (25 mg total) by mouth daily., Disp: 90 tablet, Rfl: 1 .  VITAMIN D PO, Take 5,000 Units by mouth daily. , Disp: , Rfl:  .  hydrALAZINE (APRESOLINE) 50 MG tablet, Take 1 tablet (50 mg total) by mouth 3 (three) times daily. Goal blood pressure <130/<80 for blood pressure, Disp: 270 tablet, Rfl: 3 .  LORazepam (ATIVAN) 0.5 MG tablet, Take 0.5 tablets (0.25 mg total) by mouth daily as needed for anxiety. BRAND only, Disp: 30 tablet, Rfl: 0 .  nebivolol (BYSTOLIC) 2.5 MG tablet, Take 1 tablet (2.5 mg total) by mouth at bedtime. For blood pressure, Disp: 30 tablet, Rfl: 0  EXAM:  VITALS per patient if applicable:  GENERAL: alert, oriented, appears well and in no acute distress  PSYCH/NEURO: pleasant and cooperative, no obvious depression or anxiety, speech and thought processing grossly intact  ASSESSMENT AND PLAN:  Discussed the following assessment and plan:  Anxiety/insomnia - Plan: LORazepam (ATIVAN) 0.5 MG tablet brand only   Essential hypertension - Plan: nebivolol (BYSTOLIC) 2.5 MG tablet QHS, hydrALAZINE (APRESOLINE) 50 MG tablet TID, Azilsartan-Chlorthalidone 40-12.5 MG TABS 1 pill in am   Mild intermittent asthma, unspecified whether complicated - Plan: budesonide-formoterol (SYMBICORT) 160-4.5 MCG/ACT inhaler  Cataract of both eyes, unspecified cataract type  CRAO (central retinal artery occlusion), right  Atrial tachycardia (HCC) bystolic 2.5 mg qhs   Dm 2 controlled  A1C 5.9 05/23/19  Cont meds   HM Declines flu shot  utd prevnar and pna 23  Rx Tdap given today  Declines shingrix  LMP 07/1997 pap 12/09/14 neg pap neg HPV s/p hysterectomy no h/o abnormal pap 1 ovary intact ? Which one Mammogramfat necrosis right breast 11/14/19and 10/09/18 normal Colonoscopy  05/18/16 tubular adenoma Dr. Jerene Pitch GI f/u in 5 years and FH colon cancer in dad DEXA 05/31/16 osteopenia consider repeat in 3-5 years vit D normal85.5 07/07/17 Hep C neg 07/07/17  Never smoker h/o 2nd hand exposure  -we discussed possible serious and likely etiologies, options for evaluation and workup, limitations of telemedicine visit vs in person visit, treatment, treatment risks and precautions. Pt prefers to treat via telemedicine empirically rather then risking or undertaking an in person visit at this moment. Patient agrees to  seek prompt in person care if worsening, new symptoms arise, or if is not improving with treatment.   I discussed the assessment and treatment plan with the patient. The patient was provided an opportunity to ask questions and all were answered. The patient agreed with the plan and demonstrated an understanding of the instructions.   The patient was advised to call back or seek an in-person evaluation if the symptoms worsen or if the condition fails to improve as anticipated.  Time spent 30-39 minutes  Delorise Jackson, MD

## 2019-09-14 DIAGNOSIS — M4802 Spinal stenosis, cervical region: Secondary | ICD-10-CM | POA: Diagnosis not present

## 2019-09-14 DIAGNOSIS — R2689 Other abnormalities of gait and mobility: Secondary | ICD-10-CM | POA: Diagnosis not present

## 2019-09-19 ENCOUNTER — Other Ambulatory Visit: Payer: Self-pay | Admitting: Internal Medicine

## 2019-09-19 DIAGNOSIS — I6529 Occlusion and stenosis of unspecified carotid artery: Secondary | ICD-10-CM

## 2019-09-19 DIAGNOSIS — H3411 Central retinal artery occlusion, right eye: Secondary | ICD-10-CM

## 2019-09-19 MED ORDER — CLOPIDOGREL BISULFATE 75 MG PO TABS: 75.0000 mg | ORAL_TABLET | Freq: Every day | ORAL | 3 refills | Status: DC

## 2019-09-24 DIAGNOSIS — Z9049 Acquired absence of other specified parts of digestive tract: Secondary | ICD-10-CM | POA: Insufficient documentation

## 2019-09-26 ENCOUNTER — Other Ambulatory Visit: Payer: Self-pay | Admitting: Internal Medicine

## 2019-09-26 ENCOUNTER — Telehealth: Payer: Self-pay | Admitting: Internal Medicine

## 2019-09-26 DIAGNOSIS — I10 Essential (primary) hypertension: Secondary | ICD-10-CM

## 2019-09-26 DIAGNOSIS — S86912A Strain of unspecified muscle(s) and tendon(s) at lower leg level, left leg, initial encounter: Secondary | ICD-10-CM | POA: Diagnosis not present

## 2019-09-26 DIAGNOSIS — W19XXXA Unspecified fall, initial encounter: Secondary | ICD-10-CM | POA: Diagnosis not present

## 2019-09-26 DIAGNOSIS — S8992XA Unspecified injury of left lower leg, initial encounter: Secondary | ICD-10-CM | POA: Diagnosis not present

## 2019-09-26 DIAGNOSIS — M25572 Pain in left ankle and joints of left foot: Secondary | ICD-10-CM | POA: Diagnosis not present

## 2019-09-26 DIAGNOSIS — M79662 Pain in left lower leg: Secondary | ICD-10-CM | POA: Diagnosis not present

## 2019-09-26 DIAGNOSIS — S99912A Unspecified injury of left ankle, initial encounter: Secondary | ICD-10-CM | POA: Diagnosis not present

## 2019-09-26 DIAGNOSIS — S93402A Sprain of unspecified ligament of left ankle, initial encounter: Secondary | ICD-10-CM | POA: Diagnosis not present

## 2019-09-26 MED ORDER — NEBIVOLOL HCL 2.5 MG PO TABS: 2.5000 mg | ORAL_TABLET | Freq: Every day | ORAL | 3 refills | Status: DC

## 2019-09-26 NOTE — Telephone Encounter (Signed)
For your information  

## 2019-09-26 NOTE — Telephone Encounter (Signed)
Pt stated that the nebivolol (BYSTOLIC) 2.5 MG tablet is working for her

## 2019-10-10 DIAGNOSIS — H3411 Central retinal artery occlusion, right eye: Secondary | ICD-10-CM | POA: Diagnosis not present

## 2019-10-17 ENCOUNTER — Telehealth (INDEPENDENT_AMBULATORY_CARE_PROVIDER_SITE_OTHER): Payer: Medicare Other | Admitting: Internal Medicine

## 2019-10-17 ENCOUNTER — Encounter: Payer: Self-pay | Admitting: Internal Medicine

## 2019-10-17 VITALS — BP 148/84 | HR 66 | Ht 64.0 in | Wt 185.0 lb

## 2019-10-17 DIAGNOSIS — M79672 Pain in left foot: Secondary | ICD-10-CM

## 2019-10-17 DIAGNOSIS — G8929 Other chronic pain: Secondary | ICD-10-CM | POA: Diagnosis not present

## 2019-10-17 DIAGNOSIS — E119 Type 2 diabetes mellitus without complications: Secondary | ICD-10-CM

## 2019-10-17 DIAGNOSIS — I10 Essential (primary) hypertension: Secondary | ICD-10-CM

## 2019-10-17 DIAGNOSIS — M545 Low back pain: Secondary | ICD-10-CM

## 2019-10-17 MED ORDER — NEBIVOLOL HCL 5 MG PO TABS: 5.0000 mg | ORAL_TABLET | Freq: Every day | ORAL | 3 refills | Status: DC

## 2019-10-17 MED ORDER — HYDRALAZINE HCL 50 MG PO TABS: 50.0000 mg | ORAL_TABLET | Freq: Three times a day (TID) | ORAL | 3 refills | Status: DC
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
## 2019-10-31 DIAGNOSIS — M4802 Spinal stenosis, cervical region: Secondary | ICD-10-CM | POA: Diagnosis not present

## 2019-10-31 DIAGNOSIS — R2689 Other abnormalities of gait and mobility: Secondary | ICD-10-CM | POA: Diagnosis not present

## 2019-12-12 DIAGNOSIS — M4802 Spinal stenosis, cervical region: Secondary | ICD-10-CM | POA: Diagnosis not present

## 2019-12-12 DIAGNOSIS — R2689 Other abnormalities of gait and mobility: Secondary | ICD-10-CM | POA: Diagnosis not present

## 2019-12-18 ENCOUNTER — Other Ambulatory Visit: Payer: Self-pay

## 2019-12-18 ENCOUNTER — Telehealth: Payer: Self-pay | Admitting: Internal Medicine

## 2019-12-18 ENCOUNTER — Other Ambulatory Visit (INDEPENDENT_AMBULATORY_CARE_PROVIDER_SITE_OTHER): Payer: Medicare Other

## 2019-12-18 DIAGNOSIS — I10 Essential (primary) hypertension: Secondary | ICD-10-CM

## 2019-12-18 DIAGNOSIS — E119 Type 2 diabetes mellitus without complications: Secondary | ICD-10-CM

## 2019-12-18 LAB — COMPREHENSIVE METABOLIC PANEL
ALT: 13 U/L (ref 0–35)
AST: 19 U/L (ref 0–37)
Albumin: 4.1 g/dL (ref 3.5–5.2)
Alkaline Phosphatase: 75 U/L (ref 39–117)
BUN: 14 mg/dL (ref 6–23)
CO2: 28 mEq/L (ref 19–32)
Calcium: 9.6 mg/dL (ref 8.4–10.5)
Chloride: 100 mEq/L (ref 96–112)
Creatinine, Ser: 1.01 mg/dL (ref 0.40–1.20)
GFR: 65.02 mL/min (ref >60.00)
Glucose, Bld: 114 mg/dL — ABNORMAL HIGH (ref 70–99)
Potassium: 4.1 mEq/L (ref 3.5–5.1)
Sodium: 135 mEq/L (ref 135–145)
Total Bilirubin: 0.6 mg/dL (ref 0.2–1.2)
Total Protein: 6.9 g/dL (ref 6.0–8.3)

## 2019-12-18 LAB — CBC WITH DIFFERENTIAL/PLATELET
Basophils Absolute: 0 10*3/uL (ref 0.0–0.1)
Basophils Relative: 1 % (ref 0.0–3.0)
Eosinophils Absolute: 0.1 10*3/uL (ref 0.0–0.7)
Eosinophils Relative: 2.6 % (ref 0.0–5.0)
HCT: 42.9 % (ref 36.0–46.0)
Hemoglobin: 14.4 g/dL (ref 12.0–15.0)
Lymphocytes Relative: 25 % (ref 12.0–46.0)
Lymphs Abs: 1.1 10*3/uL (ref 0.7–4.0)
MCHC: 33.5 g/dL (ref 30.0–36.0)
MCV: 88.6 fl (ref 78.0–100.0)
Monocytes Absolute: 0.4 10*3/uL (ref 0.1–1.0)
Monocytes Relative: 9.2 % (ref 3.0–12.0)
Neutro Abs: 2.8 10*3/uL (ref 1.4–7.7)
Neutrophils Relative %: 62.2 % (ref 43.0–77.0)
Platelets: 254 10*3/uL (ref 150.0–400.0)
RBC: 4.84 Mil/uL (ref 3.87–5.11)
RDW: 13.8 % (ref 11.5–15.5)
WBC: 4.5 10*3/uL (ref 4.0–10.5)

## 2019-12-18 LAB — LIPID PANEL
Cholesterol: 242 mg/dL — ABNORMAL HIGH (ref 0–200)
HDL: 51.8 mg/dL (ref >39.00)
LDL Cholesterol: 173 mg/dL — ABNORMAL HIGH (ref 0–99)
NonHDL: 190.57
Total CHOL/HDL Ratio: 5
Triglycerides: 88 mg/dL (ref 0.0–149.0)
VLDL: 17.6 mg/dL (ref 0.0–40.0)

## 2019-12-18 LAB — HEMOGLOBIN A1C: Hgb A1c MFr Bld: 6.3 % (ref 4.6–6.5)

## 2019-12-18 NOTE — Telephone Encounter (Signed)
Patient dropped off a handicap parking form to be signed by Dr. Olivia Mackie. Form is up front in color folder.

## 2019-12-19 LAB — MICROALBUMIN / CREATININE URINE RATIO
Creatinine, Urine: 140 mg/dL (ref 20–275)
Microalb Creat Ratio: 4 mcg/mg creat (ref <30)
Microalb, Ur: 0.6 mg/dL

## 2019-12-19 NOTE — Telephone Encounter (Signed)
Form given to [place] for Dr. Olivia Mackie to sign.

## 2019-12-20 ENCOUNTER — Other Ambulatory Visit: Payer: Self-pay | Admitting: Internal Medicine

## 2019-12-20 ENCOUNTER — Telehealth: Payer: Self-pay | Admitting: Internal Medicine

## 2019-12-20 DIAGNOSIS — Z20822 Contact with and (suspected) exposure to covid-19: Secondary | ICD-10-CM

## 2019-12-20 NOTE — Telephone Encounter (Signed)
Patient called and would like office to mail to her. Form is not up front, Arianna please mail to patient, per patient's request. Thanks

## 2019-12-20 NOTE — Telephone Encounter (Signed)
Placed to mail today to the address of file.

## 2019-12-20 NOTE — Telephone Encounter (Signed)
Form completed and will be placed upfront for the Patient. She was informed and will come to pick this up.

## 2019-12-31 ENCOUNTER — Other Ambulatory Visit: Payer: Self-pay | Admitting: Sports Medicine

## 2019-12-31 ENCOUNTER — Ambulatory Visit (INDEPENDENT_AMBULATORY_CARE_PROVIDER_SITE_OTHER): Payer: Medicare Other

## 2019-12-31 ENCOUNTER — Ambulatory Visit (INDEPENDENT_AMBULATORY_CARE_PROVIDER_SITE_OTHER): Payer: Medicare Other | Admitting: Sports Medicine

## 2019-12-31 ENCOUNTER — Other Ambulatory Visit: Payer: Self-pay

## 2019-12-31 ENCOUNTER — Encounter: Payer: Self-pay | Admitting: Sports Medicine

## 2019-12-31 DIAGNOSIS — M7750 Other enthesopathy of unspecified foot: Secondary | ICD-10-CM

## 2019-12-31 DIAGNOSIS — M25472 Effusion, left ankle: Secondary | ICD-10-CM | POA: Diagnosis not present

## 2019-12-31 DIAGNOSIS — M779 Enthesopathy, unspecified: Secondary | ICD-10-CM | POA: Diagnosis not present

## 2019-12-31 DIAGNOSIS — S93402A Sprain of unspecified ligament of left ankle, initial encounter: Secondary | ICD-10-CM | POA: Diagnosis not present

## 2019-12-31 DIAGNOSIS — M79672 Pain in left foot: Secondary | ICD-10-CM | POA: Diagnosis not present

## 2019-12-31 DIAGNOSIS — M7752 Other enthesopathy of left foot: Secondary | ICD-10-CM

## 2019-12-31 DIAGNOSIS — M25572 Pain in left ankle and joints of left foot: Secondary | ICD-10-CM | POA: Diagnosis not present

## 2019-12-31 MED ORDER — METHYLPREDNISOLONE 4 MG PO TBPK
ORAL_TABLET | ORAL | 0 refills | Status: DC
Start: 2019-12-31 — End: 2020-02-06

## 2019-12-31 NOTE — Progress Notes (Signed)
Subjective:  Marissa King is a 74 y.o. female patient who presents to office for evaluation of left ankle pain. Patient complains of continued pain in the ankle after she sprained it in April has had sharp pains when turning her foot and ankle and some swelling or a knot across the dorsal ankle that has not gotten better. Patient has tried Ace wrap, rest ice and elevation and a topical pain cream or rub with no relief in symptoms. Patient denies any other pedal complaints.   Review of systems noncontributory.  Patient Active Problem List   Diagnosis Date Noted   Chronic midline low back pain without sciatica 10/17/2019   S/P laparoscopic cholecystectomy 09/24/2019   Cataract 09/05/2019   Insomnia 09/05/2019   Atrial tachycardia ([place]) 09/05/2019   Anxiety 09/05/2019   CRAO (central retinal artery occlusion), 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   Combined forms of age-related cataract of both eyes 07/12/2019   Carotid artery stenosis 06/23/2019   MRI of brain abnormal 06/23/2019   Gallstones 05/22/2019   Thyroid nodule 05/22/2019   Leg edema 02/13/2019   Cervicalgia 10/04/2018   Mid back pain 10/04/2018   Toe pain, left 06/07/2018   Arthritis 06/07/2018   Type 2 diabetes mellitus without complication, without long-term current use of insulin ([place]) 06/07/2018   Multinodular goiter 06/07/2018   Essential hypertension 06/07/2018   Hyperlipidemia 06/07/2018   Chest pain 06/07/2018   Mild intermittent asthma 06/07/2018   Local edema 02/02/2018   Skin infection 10/06/2016   Osteopenia of multiple sites 10/05/2016   Cervical radiculopathy 09/10/2016   Barrett's esophagus with dysplasia 04/23/2016   Chronic constipation 09/28/2015   COPD (chronic obstructive pulmonary disease) ([place]) 09/28/2015   GERD (gastroesophageal reflux disease) 09/28/2015   History of stroke 09/28/2015   Migraine 09/28/2015   Obesity 09/28/2015   Small vessel disease ([place]) 09/28/2015    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     aspirin EC 81 MG tablet Take 81 mg by mouth daily.     Azilsartan-Chlorthalidone 40-12.5 MG TABS Take 1 tablet by mouth every morning. For blood 90 tablet 3   budesonide-formoterol (SYMBICORT) 160-4.5 MCG/ACT inhaler Inhale 2 puffs into the lungs 2 (two) times daily. Rinse mouth for asthma 1 Inhaler 11   glucose blood (PRECISION QID TEST) test strip Check sugars 1-2 times daily. E11.9 Non-insulin dependent     ipratropium (ATROVENT) 0.06 % nasal spray Place 2 sprays into the nose 4 (four) times daily. 15 mL 12   Lancets Misc. (UNISTIK 2 NORMAL) MISC Use to monitor blood sugar     loratadine (CLARITIN) 10 MG tablet Take 10 mg by mouth daily as needed for allergies.     LORazepam (ATIVAN) 0.5 MG tablet Take 0.25 mg by mouth daily as needed.     Multiple Vitamins-Minerals (MULTIVITAMIN WOMENS 50+ ADV PO) Take by mouth.     nebivolol (BYSTOLIC) 5 MG tablet Take 1 tablet (5 mg total) by mouth at bedtime. For blood pressure 90 tablet 3   ONETOUCH DELICA LANCETS FINE MISC USE TO CHECK BLOOD SUGAR 1-2 TIMES DAILY     Pitavastatin Calcium (LIVALO) 1 MG TABS Take 0.5 mg by mouth.     sitaGLIPtin (JANUVIA) 25 MG tablet Take 1 tablet (25 mg total) by mouth daily. (Patient taking differently: Take 12.5 mg by mouth daily. ) 90 tablet 1  traMADol (ULTRAM) 50 MG tablet Take 50 mg by mouth every 6 (six) hours as needed.     VITAMIN D PO Take 5,000 Units by mouth daily.      hydrALAZINE (APRESOLINE) 50 MG tablet Take 1 tablet (50 mg total) by mouth 3 (three) times daily. Goal blood pressure <130/<80 for blood pressure 270 tablet 3   No current facility-administered medications on file prior to visit.    Allergies  Allergen Reactions   Adhesive  [Tape] Hives    Tape glue    Alendronate Other (See Comments)    Stabbing pain in breast   Alprazolam Other (See Comments)    Made anxious   Amlodipine Other (See Comments)    Hives, Fell out, Black spots on face, Did not keep blood pressure down.    Atorvastatin Other (See Comments)   Benzalkonium Chloride Other (See Comments)    Made skin blister    Clindamycin Other (See Comments)    Pt states that pill that is blue and red she cannot take due feeling like she was on edge   Clonazepam Other (See Comments)    Headache    Clonidine Other (See Comments)    Hurt chest     Escitalopram Oxalate Other (See Comments)    Made anxious     Gabapentin Other (See Comments)    A bad headache     Hydrocodone-Acetaminophen Other (See Comments)    Made pain worse   Iodinated Diagnostic Agents Swelling   Iodine Other (See Comments)    Allergic to shell fish and x-ray dye    Labetalol Other (See Comments)    Felt like veins are on fire    Levofloxacin Other (See Comments)    Made me shake   Lisinopril-Hydrochlorothiazide Other (See Comments)    Was in a blue and pale color    Livalo [Pitavastatin]     Rash and nausea    Lorazepam Other (See Comments)    Formula changed    Losartan Potassium Other (See Comments)    Blood pressure     Losartan Potassium-Hctz Other (See Comments)    Could not feel arms   Meloxicam Other (See Comments)    Fatal blood pressure problems will cause strokes    Metformin Nausea And Vomiting   Metoclopramide Other (See Comments)    No air was moving, anxious     Oxcarbazepine Other (See Comments)    Felt like she was chocking     Penicillins    Plavix [Clopidogrel]     Arms and legs like going to break in 1/2 per pt    Potassium Citrate    Pravastatin Other (See Comments)   Rosuvastatin Other (See Comments)   Shellfish Allergy Swelling   Simvastatin Other (See Comments)   Sitagliptin Other (See Comments)    "Made toes feel weird"   Sodium  Citrate Other (See Comments)    Teeth broke off    Spironolactone     Breast pain     Sulfa Antibiotics Other (See Comments) and Itching   Zetia [Ezetimibe]     Muscle aches     Objective:  General: Alert and oriented x3 in no acute distress  Dermatology: No open lesions bilateral lower extremities, no webspace macerations, no ecchymosis bilateral, all nails x 10 are well manicured.  Vascular: Dorsalis Pedis and Posterior Tibial pedal pulses palpable, Capillary Fill Time 3 seconds,(+) pedal hair growth bilateral, focal swelling left ankle, temperature gradient within normal limits.  Neurology: Johney Maine sensation intact via light touch bilateral.  Musculoskeletal: Mild tenderness with palpation at left lateral ankle and to anterior tendons at the level of the ankle with mild focal soft tissue swelling no obvious not swelling is diffuse to ankle, strength within normal limits in all groups bilateral with mild guarding on left due to pain.   Gait: Antalgic gait  Xrays  Left ankle   Impression: No acute osseous findings, there is mild soft tissue swelling at the ankle.  Assessment and Plan: Problem List Items Addressed This Visit    None    Visit Diagnoses    Acute left ankle pain    -  Primary   Left foot pain       Sprain of left ankle, unspecified ligament, initial encounter       Ankle swelling, left       Tendinitis           -Complete examination performed -Xrays reviewed -Discussed treatment options for likely ankle sprain with tendinitis and swelling -Rx Medrol Dosepak for patient to take as instructed for left ankle pain and tendinitis -Dispensed ankle gauntlet and instructed patient to use every day to help provide additional support to the ankle -Dispensed Surgitube compression sleeve for patient to use during the day to assist with edema control -Recommend rest ice elevation avoid strenuous activity or excessive walking at this time -Patient to return to office  as scheduled in 3 weeks or sooner if condition worsens.  Advised patient if pain continues may benefit from [place] ordering a MRI since pain has been going on since April with no improvement.  Landis Martins, DPM

## 2020-01-02 ENCOUNTER — Other Ambulatory Visit: Payer: Self-pay | Admitting: Sports Medicine

## 2020-01-02 MED ORDER — PREDNISONE 10 MG (21) PO TBPK: ORAL_TABLET | ORAL | 0 refills | Status: DC

## 2020-01-02 NOTE — Progress Notes (Signed)
Since patient had a headache advised her to stop the Medrol and to take prednisone as I have prescribed since she has had this type of medication before without headache -Dr. Chauncey Cruel

## 2020-01-03 ENCOUNTER — Telehealth: Payer: Self-pay | Admitting: *Deleted

## 2020-01-03 NOTE — Telephone Encounter (Signed)
Called and left a message for the patient that Dr Cannon Kettle sent over the prednisone and if any concerns or questions to call the office. Lattie Haw

## 2020-01-03 NOTE — Telephone Encounter (Signed)
-----   Message from Landis Martins, [place] sent at 01/02/2020  5:23 PM EDT ----- Sent prednisone for her to take instead of Medrol ----- Message ----- From: Viviana Simpler, PMAC Sent: 01/02/2020   3:38 PM EDT To: Landis Martins, DPM  Hey Dr Cannon Kettle, the patient called and stated that she was having a real bad headache and took one of the prednisone at 1:30 today and the headache is about the same and I stated to the patient to stop taken it and patient stated that she can take the regular prednisone with nothing in it and she will be fine. Please Advise. Lattie Haw

## 2020-01-12 DIAGNOSIS — R2689 Other abnormalities of gait and mobility: Secondary | ICD-10-CM | POA: Diagnosis not present

## 2020-01-12 DIAGNOSIS — M4802 Spinal stenosis, cervical region: Secondary | ICD-10-CM | POA: Diagnosis not present

## 2020-01-21 ENCOUNTER — Encounter: Payer: Self-pay | Admitting: Sports Medicine

## 2020-01-21 ENCOUNTER — Ambulatory Visit (INDEPENDENT_AMBULATORY_CARE_PROVIDER_SITE_OTHER): Payer: Medicare Other | Admitting: Sports Medicine

## 2020-01-21 ENCOUNTER — Other Ambulatory Visit: Payer: Self-pay

## 2020-01-21 DIAGNOSIS — S93402D Sprain of unspecified ligament of left ankle, subsequent encounter: Secondary | ICD-10-CM

## 2020-01-21 DIAGNOSIS — M25572 Pain in left ankle and joints of left foot: Secondary | ICD-10-CM | POA: Diagnosis not present

## 2020-01-21 DIAGNOSIS — M779 Enthesopathy, unspecified: Secondary | ICD-10-CM | POA: Diagnosis not present

## 2020-01-21 DIAGNOSIS — M25472 Effusion, left ankle: Secondary | ICD-10-CM

## 2020-01-21 DIAGNOSIS — M79672 Pain in left foot: Secondary | ICD-10-CM

## 2020-01-21 NOTE — Progress Notes (Signed)
Subjective: Marissa King is a 74 y.o. female patient who returns to office for follow up evaluation of left ankle pain. Reports that her ankle feels somewhat better, has a little soreness at the front of the ankle but feels like it is getting better. Reports that she has still feels a little weak but the sleeve and the brace helps. Reports that she could not take the steroid because had a headache and problems with it but tried it. No other issues noted.   Patient Active Problem List   Diagnosis Date Noted  . Chronic midline low back pain without sciatica 10/17/2019  . S/P laparoscopic cholecystectomy 09/24/2019  . Cataract 09/05/2019  . Insomnia 09/05/2019  . Atrial tachycardia ([place]) 09/05/2019  . Anxiety 09/05/2019  . 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 Combined forms of age-related cataract of both eyes 07/12/2019  . Carotid artery stenosis 06/23/2019  . MRI of brain abnormal 06/23/2019  . Gallstones 05/22/2019  . Thyroid nodule 05/22/2019  . Leg edema 02/13/2019  . Cervicalgia 10/04/2018  . Mid back pain 10/04/2018  . Toe pain, left 06/07/2018  . Arthritis 06/07/2018  . Type 2 diabetes mellitus without complication, without long-term current use of insulin ([place]) 06/07/2018  . Multinodular goiter 06/07/2018  . Essential hypertension 06/07/2018  . Hyperlipidemia 06/07/2018  . Chest pain 06/07/2018  . Mild intermittent asthma 06/07/2018  . Local edema 02/02/2018  . Skin infection 10/06/2016  . Osteopenia of multiple sites 10/05/2016  . Cervical radiculopathy 09/10/2016  . Barrett's esophagus with dysplasia 04/23/2016  . Chronic constipation 09/28/2015  . COPD (chronic obstructive pulmonary disease) ([place]) 09/28/2015  . GERD (gastroesophageal reflux disease) 09/28/2015  . History of stroke 09/28/2015  . Migraine 09/28/2015  . Obesity 09/28/2015  . Small vessel disease ([place]) 09/28/2015  . Vasomotor rhinitis 09/28/2015    Current Outpatient Medications  on File Prior to Visit  Medication Sig Dispense Refill  . albuterol (PROVENTIL HFA;VENTOLIN HFA) 108 (90 Base) MCG/ACT inhaler Inhale into the lungs.    Marland Kitchen aspirin EC 81 MG tablet Take 81 mg by mouth daily.    . Azilsartan-Chlorthalidone 40-12.5 MG TABS Take 1 tablet by mouth every morning. For blood 90 tablet 3  . budesonide-formoterol (SYMBICORT) 160-4.5 MCG/ACT inhaler Inhale 2 puffs into the lungs 2 (two) times daily. Rinse mouth for asthma 1 Inhaler 11  . glucose blood (PRECISION QID TEST) test strip Check sugars 1-2 times daily. E11.9 Non-insulin dependent    . ipratropium (ATROVENT) 0.06 % nasal spray Place 2 sprays into the nose 4 (four) times daily. 15 mL 12  . Lancets Misc. (UNISTIK 2 NORMAL) MISC Use to monitor blood sugar    . loratadine (CLARITIN) 10 MG tablet Take 10 mg by mouth daily as needed for allergies.    Marland Kitchen LORazepam (ATIVAN) 0.5 MG tablet Take 0.25 mg by mouth daily as needed.    . methylPREDNISolone (MEDROL DOSEPAK) 4 MG TBPK tablet Take as directed 21 tablet 0  . Multiple Vitamins-Minerals (MULTIVITAMIN WOMENS 50+ ADV PO) Take by mouth.    . nebivolol (BYSTOLIC) 5 MG tablet Take 1 tablet (5 mg total) by mouth at bedtime. For blood pressure 90 tablet 3  . ONETOUCH DELICA LANCETS FINE MISC USE TO CHECK BLOOD SUGAR 1-2 TIMES DAILY    . Pitavastatin Calcium (LIVALO) 1 MG TABS Take 0.5 mg by mouth.    . predniSONE (STERAPRED UNI-PAK 21 TAB) 10 MG (21) TBPK tablet D/c Medrol and take this one as directed 21  tablet 0  . sitaGLIPtin (JANUVIA) 25 MG tablet Take 1 tablet (25 mg total) by mouth daily. (Patient taking differently: Take 12.5 mg by mouth daily. ) 90 tablet 1  . traMADol (ULTRAM) 50 MG tablet Take 50 mg by mouth every 6 (six) hours as needed.    Marland Kitchen VITAMIN D PO Take 5,000 Units by mouth daily.     . hydrALAZINE (APRESOLINE) 50 MG tablet Take 1 tablet (50 mg total) by mouth 3 (three) times daily. Goal blood pressure <130/<80 for blood pressure 270 tablet 3   No current  facility-administered medications on file prior to visit.    Allergies  Allergen Reactions  . Adhesive  [Tape] Hives    Tape glue  . Alendronate Other (See Comments)    Stabbing pain in breast  . Alprazolam Other (See Comments)    Made anxious  . Amlodipine Other (See Comments)    Hives, Golden Circle out, Black spots on face, Did not keep blood pressure down.   . Atorvastatin Other (See Comments)  . Benzalkonium Chloride Other (See Comments)    Made skin blister   . Clindamycin Other (See Comments)    Pt states that pill that is blue and red she cannot take due feeling like she was on edge  . Clonazepam Other (See Comments)    Headache   . Clonidine Other (See Comments)    Hurt chest    . Escitalopram Oxalate Other (See Comments)    Made anxious    . Gabapentin Other (See Comments)    A bad headache    . Hydrocodone-Acetaminophen Other (See Comments)    Made pain worse  . Iodinated Diagnostic Agents Swelling  . Iodine Other (See Comments)    Allergic to shell fish and x-ray dye   . Labetalol Other (See Comments)    Felt like veins are on fire   . Levofloxacin Other (See Comments)    Made me shake  . Lisinopril-Hydrochlorothiazide Other (See Comments)    Was in a blue and pale color   . Livalo [Pitavastatin]     Rash and nausea   . Lorazepam Other (See Comments)    Formula changed   . Losartan Potassium Other (See Comments)    Blood pressure    . Losartan Potassium-Hctz Other (See Comments)    Could not feel arms  . Meloxicam Other (See Comments)    Fatal blood pressure problems will cause strokes   . Metformin Nausea And Vomiting  . Metoclopramide Other (See Comments)    No air was moving, anxious    . Oxcarbazepine Other (See Comments)    Felt like she was chocking    . Penicillins   . Plavix [Clopidogrel]     Arms and legs like going to break in 1/2 per pt   . Potassium Citrate   . Pravastatin Other (See Comments)  . Rosuvastatin Other (See Comments)   . Shellfish Allergy Swelling  . Simvastatin Other (See Comments)  . Sitagliptin Other (See Comments)    "Made toes feel weird"  . Sodium Citrate Other (See Comments)    Teeth broke off   . Spironolactone     Breast pain    . Sulfa Antibiotics Other (See Comments) and Itching  . Zetia [Ezetimibe]     Muscle aches     Objective:  General: Alert and oriented x3 in no acute distress  Dermatology: No open lesions bilateral lower extremities, no webspace macerations, no ecchymosis bilateral, all nails x  10 are well manicured.  Vascular: Dorsalis Pedis and Posterior Tibial pedal pulses palpable, Capillary Fill Time 3 seconds,(+) pedal hair growth bilateral, focal swelling left ankle, temperature gradient within normal limits.  Neurology: Johney Maine sensation intact via light touch bilateral.  Musculoskeletal: Mild tenderness with palpation at left lateral ankle and to anterior tendons at the level of the ankle with mild focal soft tissue swelling that is improving, strength within normal limits in all groups bilateral with mild guarding on left due to pain that is improving.   Assessment and Plan: Problem List Items Addressed This Visit    None    Visit Diagnoses    Left foot pain    -  Primary   Acute left ankle pain       Sprain of left ankle, unspecified ligament, subsequent encounter       Ankle swelling, left       Tendinitis          -Complete examination performed -Re-Discussed treatment options for ankle sprain with tendinitis and swelling -Continue with ankle gauntlet and instructed patient to use every day to help provide additional support to the ankle like before  -Dispensed Anklet compression sleeve for patient to use during the day to assist with edema control like before -Recommend continue with rest ice elevation avoid strenuous activity or excessive walking at this time to tolerance  -Patient to return to office as needed advised patient that if she is struggling  with home PT to call for an Rx for PT or if she is having increased pain to call for Rx for MRI.  Landis Martins, DPM

## 2020-02-06 ENCOUNTER — Ambulatory Visit (INDEPENDENT_AMBULATORY_CARE_PROVIDER_SITE_OTHER): Payer: Medicare Other | Admitting: Internal Medicine

## 2020-02-06 ENCOUNTER — Encounter: Payer: Self-pay | Admitting: Internal Medicine

## 2020-02-06 ENCOUNTER — Other Ambulatory Visit: Payer: Self-pay

## 2020-02-06 VITALS — BP 132/82 | HR 63 | Temp 98.6°F | Ht 64.0 in | Wt 189.0 lb

## 2020-02-06 DIAGNOSIS — H269 Unspecified cataract: Secondary | ICD-10-CM

## 2020-02-06 DIAGNOSIS — Z20822 Contact with and (suspected) exposure to covid-19: Secondary | ICD-10-CM

## 2020-02-06 DIAGNOSIS — I10 Essential (primary) hypertension: Secondary | ICD-10-CM | POA: Diagnosis not present

## 2020-02-06 DIAGNOSIS — E785 Hyperlipidemia, unspecified: Secondary | ICD-10-CM

## 2020-02-06 DIAGNOSIS — I152 Hypertension secondary to endocrine disorders: Secondary | ICD-10-CM | POA: Insufficient documentation

## 2020-02-06 DIAGNOSIS — E7849 Other hyperlipidemia: Secondary | ICD-10-CM

## 2020-02-06 DIAGNOSIS — I6529 Occlusion and stenosis of unspecified carotid artery: Secondary | ICD-10-CM

## 2020-02-06 DIAGNOSIS — F419 Anxiety disorder, unspecified: Secondary | ICD-10-CM

## 2020-02-06 DIAGNOSIS — Z1231 Encounter for screening mammogram for malignant neoplasm of breast: Secondary | ICD-10-CM | POA: Diagnosis not present

## 2020-02-06 DIAGNOSIS — E2839 Other primary ovarian failure: Secondary | ICD-10-CM

## 2020-02-06 DIAGNOSIS — G729 Myopathy, unspecified: Secondary | ICD-10-CM | POA: Insufficient documentation

## 2020-02-06 DIAGNOSIS — E1159 Type 2 diabetes mellitus with other circulatory complications: Secondary | ICD-10-CM | POA: Diagnosis not present

## 2020-02-06 DIAGNOSIS — H5461 Unqualified visual loss, right eye, normal vision left eye: Secondary | ICD-10-CM

## 2020-02-06 MED ORDER — NEBIVOLOL HCL 5 MG PO TABS: 5.0000 mg | ORAL_TABLET | Freq: Every day | ORAL | 3 refills | Status: DC

## 2020-02-06 MED ORDER — NEBIVOLOL HCL 2.5 MG PO TABS: 2.5000 mg | ORAL_TABLET | Freq: Every day | ORAL | 3 refills | Status: DC

## 2020-02-06 MED ORDER — LORAZEPAM 0.5 MG PO TABS: 0.2500 mg | ORAL_TABLET | Freq: Every day | ORAL | 2 refills | Status: DC | PRN

## 2020-02-06 MED ORDER — SITAGLIPTIN PHOSPHATE 25 MG PO TABS: 25.0000 mg | ORAL_TABLET | Freq: Every day | ORAL | 3 refills | Status: DC

## 2020-02-06 NOTE — Progress Notes (Addendum)
Chief Complaint  Patient presents with  . Follow-up   F/u  1. HTN on bystolic 5 mg qd at times 2.5 mg qd hydralazine 50 mg tid, azilsartan-chlorthalidone 1/2 40-12.5 mg qd doing well BP improved  HLD not able to tolerate statin even 1/2 dose of 1 mg livalo appt upcoming with cardiology to ask about repatha 2. H/o today 4/10 frontal when it rains gets h/a  3. Anxiety she needs brand ativan given Rx [facility] or [facility] it cost too much [facility] 4. Chronic left ankle pain seeing podiatry and may need MRI f/u upcoming  5. DM 2 a1c improved on januvia 25 mg qd 6.5 to 6.3   Review of Systems  Constitutional: Negative for weight loss.  HENT: Negative for hearing loss.   Eyes: Negative for blurred vision.  Respiratory: Negative for shortness of breath.   Cardiovascular: Negative for chest pain.  Gastrointestinal: Negative for abdominal pain.  Skin: Negative for rash.  Neurological: Positive for headaches.  Psychiatric/Behavioral: Negative for depression and memory loss.   Past Medical History:  Diagnosis Date  . Allergy   . 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 Asthma    with h/o chronic bronchitis  . Back pain    mid back pain h/ o trauma   . Diabetes mellitus without complication ([place])   . Frequent headaches   . Gastric ulcer   . GERD (gastroesophageal reflux disease)   . History of chicken pox   . Hyperlipidemia   . Hypertension   . Migraines   . Thyroid disease    mng    Past Surgical History:  Procedure Laterality Date  . ABDOMINAL HYSTERECTOMY     DUB 1 ovary intact   . CHOLECYSTECTOMY     09/02/19   Family History  Problem Relation Age of Onset  . Alcohol abuse Mother   . Heart disease Mother        CHF  . Hypertension Mother   . Hyperlipidemia Mother   . Stroke Mother   . Mental illness Mother   . Alcohol abuse Father   . Cancer Father        pancreatitic   . Arthritis Sister   . COPD Sister   . Depression Sister   . Drug  abuse Sister   . Hearing loss Sister   . Heart disease Sister        CHF  . Hyperlipidemia Sister   . Hypertension Sister   . Mental illness Sister   . COPD Brother   . Depression Brother   . Heart disease Brother   . Hyperlipidemia Brother   . Stroke Brother   . Asthma Son   . Arthritis Brother   . Depression Brother   . Heart disease Brother   . Hyperlipidemia Brother   . Mental illness Brother   . Mental illness Brother   . Hyperlipidemia Brother   . Drug abuse Brother   . Diabetes Brother   . Depression Brother   . COPD Brother   . Birth defects Brother   . Arthritis Brother    Social History   Socioeconomic History  . Marital status: Widowed    Spouse name: Not on file  . Number of children: Not on file  . Years of education: Not on file  . Highest education level: Not on file  Occupational History  . Not on file  Tobacco Use  . Smoking status:  Never Smoker  . Smokeless tobacco: Never Used  Substance and Sexual Activity  . Alcohol use: No  . Drug use: No  . Sexual activity: Not Currently  Other Topics Concern  . Not on file  Social History Narrative   2 sons, 4 pregnancies    Brother is [name]    Never smoker exposed 2nd hand    No guns    Wears seat belt    Safe in relationship, widowed lives alone   2 year college ed    Social Determinants of Health   Financial Resource Strain: Low Risk   . Difficulty of Paying Living Expenses: Not hard at all  Food Insecurity: No Food Insecurity  . Worried About Charity fundraiser in the Last Year: Never true  . Ran Out of Food in the Last Year: Never true  Transportation Needs: No Transportation Needs  . Lack of Transportation (Medical): No  . Lack of Transportation (Non-Medical): No  Physical Activity: Sufficiently Active  . Days of Exercise per Week: 4 days  . Minutes of Exercise per Session: 40 min  Stress: No Stress Concern Present  . Feeling of Stress : Not at all  Social Connections:   .  Frequency of Communication with Friends and Family:   . Frequency of Social Gatherings with Friends and Family:   . Attends Religious Services:   . Active Member of Clubs or Organizations:   . Attends Archivist Meetings:   Marland Kitchen Marital Status:   Intimate Partner Violence:   . Fear of Current or Ex-Partner:   . Emotionally Abused:   Marland Kitchen Physically Abused:   . Sexually Abused:    Current Meds  Medication Sig  . aspirin EC 81 MG tablet Take 81 mg by mouth daily.  . Azilsartan-Chlorthalidone 40-12.5 MG TABS Take 1 tablet by mouth every morning. For blood  . budesonide-formoterol (SYMBICORT) 160-4.5 MCG/ACT inhaler Inhale 2 puffs into the lungs 2 (two) times daily. Rinse mouth for asthma  . glucose blood (PRECISION QID TEST) test strip Check sugars 1-2 times daily. 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 ipratropium (ATROVENT) 0.06 % nasal spray Place 2 sprays into the nose 4 (four) times daily.  . Lancets Misc. (UNISTIK 2 NORMAL) MISC Use to monitor blood sugar  . loratadine (CLARITIN) 10 MG tablet Take 10 mg by mouth daily as needed for allergies.  . Multiple Vitamins-Minerals (MULTIVITAMIN WOMENS 50+ ADV PO) Take by mouth.  . nebivolol (BYSTOLIC) 5 MG tablet Take 1 tablet (5 mg total) by mouth at bedtime. For blood pressure <130/<80 if elevated can take 2.5 if needed total up to 7.5 mg daily max  . ONETOUCH DELICA LANCETS FINE MISC USE TO CHECK BLOOD SUGAR 1-2 TIMES DAILY  . predniSONE (STERAPRED UNI-PAK 21 TAB) 10 MG (21) TBPK tablet D/c Medrol and take this one as directed  . sitaGLIPtin (JANUVIA) 25 MG tablet Take 1 tablet (25 mg total) by mouth daily.  Marland Kitchen VITAMIN D PO Take 5,000 Units by mouth daily.   . [DISCONTINUED] nebivolol (BYSTOLIC) 5 MG tablet Take 1 tablet (5 mg total) by mouth at bedtime. For blood pressure  . [DISCONTINUED] sitaGLIPtin (JANUVIA) 25 MG tablet Take 1 tablet (25 mg total) by mouth daily. (Patient taking differently: Take 12.5 mg by mouth daily. )   Allergies   Allergen Reactions  . Adhesive  [Tape] Hives    Tape glue  . Alendronate Other (See Comments)    Stabbing pain in breast  . Alprazolam  Other (See Comments)    Made anxious  . Amlodipine Other (See Comments)    Hives, Golden Circle out, Black spots on face, Did not keep blood pressure down.   . Atorvastatin Other (See Comments)  . Benzalkonium Chloride Other (See Comments)    Made skin blister   . Clindamycin Other (See Comments)    Pt states that pill that is blue and red she cannot take due feeling like she was on edge  . Clonazepam Other (See Comments)    Headache   . Clonidine Other (See Comments)    Hurt chest    . Escitalopram Oxalate Other (See Comments)    Made anxious    . Gabapentin Other (See Comments)    A bad headache    . Hydrocodone-Acetaminophen Other (See Comments)    Made pain worse  . Iodinated Diagnostic Agents Swelling  . Iodine Other (See Comments)    Allergic to shell fish and x-ray dye   . Labetalol Other (See Comments)    Felt like veins are on fire   . Levofloxacin Other (See Comments)    Made me shake  . Lisinopril-Hydrochlorothiazide Other (See Comments)    Was in a blue and pale color   . Livalo [Pitavastatin]     Rash and nausea   . Lorazepam Other (See Comments)    Formula changed   . Losartan Potassium Other (See Comments)    Blood pressure    . Losartan Potassium-Hctz Other (See Comments)    Could not feel arms  . Meloxicam Other (See Comments)    Fatal blood pressure problems will cause strokes   . Metformin Nausea And Vomiting  . Metoclopramide Other (See Comments)    No air was moving, anxious    . Oxcarbazepine Other (See Comments)    Felt like she was chocking    . Penicillins   . Plavix [Clopidogrel]     Arms and legs like going to break in 1/2 per pt   . Potassium Citrate   . Pravastatin Other (See Comments)  . Rosuvastatin Other (See Comments)  . Shellfish Allergy Swelling  . Simvastatin Other (See Comments)  .  Sitagliptin Other (See Comments)    "Made toes feel weird"  . Sodium Citrate Other (See Comments)    Teeth broke off   . Spironolactone     Breast pain    . Sulfa Antibiotics Other (See Comments) and Itching  . Zetia [Ezetimibe]     Muscle aches    Recent Results (from the past 2160 hour(s))  Microalbumin / creatinine urine ratio     Status: None   Collection Time: 12/18/19  9:03 AM  Result Value Ref Range   Creatinine, Urine 140 20 - 275 mg/dL   Microalb, Ur 0.6 mg/dL    Comment: Reference Range Not established    Microalb Creat Ratio 4 <30 mcg/mg creat    Comment: . The ADA defines abnormalities in albumin excretion as follows: Marland Kitchen Category         Result (mcg/mg creatinine) . Normal                    <30 Microalbuminuria         30-299  Clinical albuminuria   > OR = 300 . The ADA recommends that at least two of three specimens collected within a 3-6 month period be abnormal before considering a patient to be within a diagnostic category.   Hemoglobin A1c  Status: None   Collection Time: 12/18/19  9:03 AM  Result Value Ref Range   Hgb A1c MFr Bld 6.3 4.6 - 6.5 %    Comment: Glycemic Control Guidelines for People with Diabetes:Non Diabetic:  <6%Goal of Therapy: <7%Additional Action Suggested:  >8%   CBC with Differential/Platelet     Status: None   Collection Time: 12/18/19  9:03 AM  Result Value Ref Range   WBC 4.5 4.0 - 10.5 K/uL   RBC 4.84 3.87 - 5.11 Mil/uL   Hemoglobin 14.4 12.0 - 15.0 g/dL   HCT 42.9 36 - 46 %   MCV 88.6 78.0 - 100.0 fl   MCHC 33.5 30.0 - 36.0 g/dL   RDW 13.8 11.5 - 15.5 %   Platelets 254.0 150 - 400 K/uL   Neutrophils Relative % 62.2 43 - 77 %   Lymphocytes Relative 25.0 12 - 46 %   Monocytes Relative 9.2 3 - 12 %   Eosinophils Relative 2.6 0 - 5 %   Basophils Relative 1.0 0 - 3 %   Neutro Abs 2.8 1.4 - 7.7 K/uL   Lymphs Abs 1.1 0.7 - 4.0 K/uL   Monocytes Absolute 0.4 0 - 1 K/uL   Eosinophils Absolute 0.1 0 - 0 K/uL   Basophils  Absolute 0.0 0 - 0 K/uL  Comprehensive metabolic panel     Status: Abnormal   Collection Time: 12/18/19  9:03 AM  Result Value Ref Range   Sodium 135 135 - 145 mEq/L   Potassium 4.1 3.5 - 5.1 mEq/L   Chloride 100 96 - 112 mEq/L   CO2 28 19 - 32 mEq/L   Glucose, Bld 114 (H) 70 - 99 mg/dL   BUN 14 6 - 23 mg/dL   Creatinine, Ser 1.01 0.40 - 1.20 mg/dL   Total Bilirubin 0.6 0.2 - 1.2 mg/dL   Alkaline Phosphatase 75 39 - 117 U/L   AST 19 0 - 37 U/L   ALT 13 0 - 35 U/L   Total Protein 6.9 6.0 - 8.3 g/dL   Albumin 4.1 3.5 - 5.2 g/dL   GFR 65.02 >60.00 mL/min   Calcium 9.6 8.4 - 10.5 mg/dL  Lipid panel     Status: Abnormal   Collection Time: 12/18/19  9:03 AM  Result Value Ref Range   Cholesterol 242 (H) 0 - 200 mg/dL    Comment: ATP III Classification       Desirable:  < 200 mg/dL               Borderline High:  200 - 239 mg/dL          High:  > = 240 mg/dL   Triglycerides 88.0 0 - 149 mg/dL    Comment: Normal:  <150 mg/dLBorderline High:  150 - 199 mg/dL   HDL 51.80 >39.00 mg/dL   VLDL 17.6 0.0 - 40.0 mg/dL   LDL Cholesterol 173 (H) 0 - 99 mg/dL   Total CHOL/HDL Ratio 5     Comment:                Men          Women1/2 Average Risk     3.4          3.3Average Risk          5.0          4.42X Average Risk          9.6  7.13X Average Risk          15.0          11.0                       NonHDL 190.57     Comment: NOTE:  Non-HDL goal should be 30 mg/dL higher than patient's LDL goal (i.e. LDL goal of < 70 mg/dL, would have non-HDL goal of < 100 mg/dL)   Objective  Body mass index is 32.44 kg/m. Wt Readings from Last 3 Encounters:  02/06/20 189 lb (85.7 kg)  10/17/19 185 lb (83.9 kg)  09/05/19 185 lb (83.9 kg)   Temp Readings from Last 3 Encounters:  02/06/20 98.6 F (37 C) (Oral)  05/22/19 98 F (36.7 C) (Skin)  10/04/18 98.5 F (36.9 C) (Oral)   BP Readings from Last 3 Encounters:  02/06/20 132/82  10/17/19 (!) 148/84  09/05/19 (!) 196/99   Pulse Readings from  Last 3 Encounters:  02/06/20 63  10/17/19 66  05/22/19 75    Physical Exam Vitals and nursing note reviewed.  Constitutional:      Appearance: Normal appearance. She is well-developed and well-groomed. She is obese.  HENT:     Head: Normocephalic and atraumatic.  Eyes:     Conjunctiva/sclera: Conjunctivae normal.     Pupils: Pupils are equal, round, and reactive to light.  Cardiovascular:     Rate and Rhythm: Normal rate and regular rhythm.     Heart sounds: Normal heart sounds. No murmur heard.   Pulmonary:     Effort: Pulmonary effort is normal.     Breath sounds: Normal breath sounds.  Skin:    General: Skin is warm and dry.  Neurological:     General: No focal deficit present.     Mental Status: She is alert and oriented to person, place, and time. Mental status is at baseline.     Gait: Gait normal.  Psychiatric:        Attention and Perception: Attention and perception normal.        Mood and Affect: Mood and affect normal.        Speech: Speech normal.        Behavior: Behavior normal. Behavior is cooperative.        Thought Content: Thought content normal.        Cognition and Memory: Cognition and memory normal.        Judgment: Judgment normal.     Assessment  Plan  Essential hypertension - Plan: nebivolol (BYSTOLIC) 5 MG tablet, prn nebivolol (BYSTOLIC) 2.5 MG tablet if BP >130/>80  Cont other meds   Anxiety - Plan: LORazepam (ATIVAN) 0.5 MG tablet price [facility] or [facility]  1.2 to 1 pill qd prn   Hypertension associated with diabetes ([place]) - Plan: sitaGLIPtin (JANUVIA) 25 MG tablet Improved DM 2  Cont meds intermittently takes livalo 1 mg 1/2 pill but per pharmacy review not filling  Exposure to COVID-19 virus - Plan: SARS-CoV-2 Antibodies  Hyperlipidemia, unspecified hyperlipidemia type Disc repatha with cards   Vision loss, 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 Jerene Pitch GI f/u in 5 years and FH colon cancer in dad  DEXA 05/31/16 osteopenia consider repeat in 3-5 years vit D normal85.5 07/07/17 -ordered  Hep C neg 07/07/17  Never smoker h/o 2nd hand exposure  US thyroid nodules/MNG 06/14/18 due again consider at f/u   Provider: Dr. Olivia Mackie McLean-Scocuzza-Internal Medicine

## 2020-02-06 NOTE — Patient Instructions (Addendum)
Cardiology ask about repatha for cholesterol   Results for KIVA, NORLAND (MRN [id number]) as of 02/06/2020 14:03  Ref. Range 12/18/2019 09:03  Cholesterol Latest Ref Range: 0 - 200 mg/dL 242 (H)  HDL Cholesterol Latest Ref Range: >39.00 mg/dL 51.80  LDL (calc) Latest Ref Range: 0 - 99 mg/dL 173 (H)  MICROALB/CREAT RATIO Latest Ref Range: <30 mcg/mg creat 4  NonHDL Unknown 190.57  Triglycerides Latest Ref Range: 0 - 149 mg/dL 88.0  VLDL Latest Ref Range: 0.0 - 40.0 mg/dL 17.6    Evolocumab injection/repatha  What is this medicine? EVOLOCUMAB (e voe LOK ue mab) is known as a PCSK9 inhibitor. It is used to lower the level of cholesterol in the blood. It may be used alone or in combination with other cholesterol-lowering drugs. This drug may also be used to reduce the risk of heart attack, stroke, and certain types of heart surgery in patients with heart disease. This medicine may be used for other purposes; ask your health care provider or pharmacist if you have questions. COMMON BRAND NAME(S): Repatha What should I tell my health care provider before I take this medicine? They need to know if you have any of these conditions:  an unusual or allergic reaction to evolocumab, other medicines, latex, foods, dyes, or preservatives  pregnant or trying to get pregnant  breast-feeding How should I use this medicine? This medicine is for injection under the skin. You will be taught how to prepare and give this medicine. Use exactly as directed. Take your medicine at regular intervals. Do not take your medicine more often than directed. It is important that you put your used needles and syringes in a special sharps container. Do not put them in a trash can. If you do not have a sharps container, call your pharmacist or health care provider to get one. Talk to your pediatrician regarding the use of this medicine in children. While this drug may be prescribed for children as young as 13 years for selected  conditions, precautions do apply. Overdosage: If you think you have taken too much of this medicine contact a poison control center or emergency room at once. NOTE: This medicine is only for you. Do not share this medicine with others. What if I miss a dose? If you miss a dose, take it as soon as you can if there are more than 7 days until the next scheduled dose, or skip the missed dose and take the next dose according to your original schedule. Do not take double or extra doses. What may interact with this medicine? Interactions are not expected. This list may not describe all possible interactions. Give your health care provider a list of all the medicines, herbs, non-prescription drugs, or dietary supplements you use. Also tell them if you smoke, drink alcohol, or use illegal drugs. Some items may interact with your medicine. What should I watch for while using this medicine? Visit your health care provider for regular checks on your progress. Tell your health care provider if your symptoms do not start to get better or if they get worse. You may need blood work done while you are taking this drug. Do not wear the on-body infuser during an MRI. What side effects may I notice from receiving this medicine? Side effects that you should report to your doctor or health care professional as soon as possible:  allergic reactions like skin rash, itching or hives, swelling of the face, lips, or tongue  signs and symptoms  of high blood sugar such as dizziness; dry mouth; dry skin; fruity breath; nausea; stomach pain; increased hunger or thirst; increased urination  signs and symptoms of infection like fever or chills; cough; sore throat; pain or trouble passing urine Side effects that usually do not require medical attention (report to your doctor or health care professional if they continue or are bothersome):  diarrhea  nausea  muscle pain  pain, redness, or irritation at site where  injected This list may not describe all possible side effects. Call your doctor for medical advice about side effects. You may report side effects to FDA at 1-800-FDA-1088. Where should I keep my medicine? Keep out of the reach of children. You will be instructed on how to store this medicine. Throw away any unused medicine after the expiration date on the label. NOTE: This sheet is a summary. It may not cover all possible information. If you have questions about this medicine, talk to your doctor, pharmacist, or health care provider.  2020 Elsevier/Gold Standard (2019-05-21 16:22:29)

## 2020-02-06 NOTE — Progress Notes (Signed)
Patient flagged: Current status:  PATIENT IS OVERDUE FOR BMI FOLLOW UP PLAN BMI is estimated to be 32.4 based on the last recorded weight and height

## 2020-02-07 LAB — SARS-COV-2 ANTIBODIES: SARS-CoV-2 Antibodies: NEGATIVE

## 2020-02-11 DIAGNOSIS — R2689 Other abnormalities of gait and mobility: Secondary | ICD-10-CM | POA: Diagnosis not present

## 2020-02-11 DIAGNOSIS — M4802 Spinal stenosis, cervical region: Secondary | ICD-10-CM | POA: Diagnosis not present

## 2020-02-12 ENCOUNTER — Telehealth: Payer: Self-pay | Admitting: Internal Medicine

## 2020-02-12 NOTE — Telephone Encounter (Signed)
-----   Message from Delorise Jackson, MD sent at 02/10/2020  8:42 AM EDT ----- Covid antibodies negative

## 2020-02-12 NOTE — Telephone Encounter (Signed)
Left message to return call 

## 2020-02-18 DIAGNOSIS — R05 Cough: Secondary | ICD-10-CM | POA: Diagnosis not present

## 2020-02-18 DIAGNOSIS — R0981 Nasal congestion: Secondary | ICD-10-CM | POA: Diagnosis not present

## 2020-02-18 DIAGNOSIS — R519 Headache, unspecified: Secondary | ICD-10-CM | POA: Diagnosis not present

## 2020-02-18 DIAGNOSIS — R5383 Other fatigue: Secondary | ICD-10-CM | POA: Diagnosis not present

## 2020-02-18 DIAGNOSIS — Z20822 Contact with and (suspected) exposure to covid-19: Secondary | ICD-10-CM | POA: Diagnosis not present

## 2020-02-25 ENCOUNTER — Telehealth: Payer: Self-pay | Admitting: Internal Medicine

## 2020-02-25 DIAGNOSIS — H3411 Central retinal artery occlusion, right eye: Secondary | ICD-10-CM | POA: Diagnosis not present

## 2020-02-25 NOTE — Telephone Encounter (Signed)
Patient states she just left her other doctor's appointment. They did not prescribe the Repatha injection for her.   Patient wanting to know if you would like to prescribe this for her?   Please advise

## 2020-02-28 MED ORDER — PEN NEEDLES 30G X 8 MM MISC: 1.0000 | 11 refills | Status: DC

## 2020-02-28 MED ORDER — REPATHA 140 MG/ML ~~LOC~~ SOSY: 140.0000 mg | PREFILLED_SYRINGE | SUBCUTANEOUS | 5 refills | Status: DC

## 2020-02-28 NOTE — Addendum Note (Signed)
Addended by: Orland Mustard on: 02/28/2020 05:58 PM   Modules accepted: Orders

## 2020-02-28 NOTE — Telephone Encounter (Signed)
Sent Rx to her pharmacy let us know if cant get

## 2020-03-05 ENCOUNTER — Telehealth: Payer: Self-pay | Admitting: Internal Medicine

## 2020-03-10 ENCOUNTER — Other Ambulatory Visit: Payer: Self-pay

## 2020-03-11 ENCOUNTER — Ambulatory Visit: Payer: Medicare Other

## 2020-03-11 ENCOUNTER — Telehealth: Payer: Self-pay | Admitting: Internal Medicine

## 2020-03-11 ENCOUNTER — Ambulatory Visit: Payer: Medicare Other | Admitting: Pharmacist

## 2020-03-11 DIAGNOSIS — Z7189 Other specified counseling: Secondary | ICD-10-CM

## 2020-03-11 DIAGNOSIS — E785 Hyperlipidemia, unspecified: Secondary | ICD-10-CM

## 2020-03-11 NOTE — Telephone Encounter (Signed)
Patient has her first covid injection and is having her 2nd injection in 2 days. Her tongue is stiff on one side. She is requesting a muscle relaxer.

## 2020-03-11 NOTE — Telephone Encounter (Signed)
Please advise 

## 2020-03-11 NOTE — Progress Notes (Signed)
Patient was instructed on how to give herself the rapatha injections. Our pharmacist showed and demonstrated on how to give injection. Patient stated she will wait until after she received her final covid 19 vaccine to start injection. Patient had no further question at this time.

## 2020-03-11 NOTE — Addendum Note (Signed)
Addended by: Orland Mustard on: 03/11/2020 06:41 PM   Modules accepted: Orders

## 2020-03-11 NOTE — Telephone Encounter (Signed)
Can she try otc allergy pill muscle relaxer is not the treatment for this  Also if worsening rec Ed/urgent care

## 2020-03-11 NOTE — Chronic Care Management (AMB) (Signed)
Chronic Care Management   Note  03/11/2020 Name: Aqsa Sensabaugh MRN: [id number] DOB: 02-Jul-1946  Zurisadai Helminiak is a 74 y.o. year old female who is a primary care patient of McLean-Scocuzza, Nino Glow, MD. The CCM team was consulted for assistance with chronic disease management and care coordination needs.    Met with patient and provided education on Repatha administration. Patient verbalized understanding. She is going to wait until after receiving her COVID injection to administer Repatha.   Ms. Lawhorn was given information about Chronic Care Management services today including:  1. CCM service includes personalized support from designated clinical staff supervised by her physician, including individualized plan of care and coordination with other care providers 2. 24/7 contact phone numbers for assistance for urgent and routine care needs. 3. Service will only be billed when office clinical staff spend 20 minutes or more in a month to coordinate care. 4. Only one practitioner may furnish and bill the service in a calendar month. 5. The patient may stop CCM services at any time (effective at the end of the month) by phone call to the office staff. 6. The patient will be responsible for cost sharing (co-pay) of up to 20% of the service fee (after annual deductible is met).  Patient agreed to services and verbal consent obtained.    Plan: - Scheduled initial call for full medication review in ~ 4-5 weeks  Catie Darnelle Maffucci, PharmD, [place], CPP Clinical Pharmacist [place] [phone]

## 2020-03-13 DIAGNOSIS — R2689 Other abnormalities of gait and mobility: Secondary | ICD-10-CM | POA: Diagnosis not present

## 2020-03-13 DIAGNOSIS — M4802 Spinal stenosis, cervical region: Secondary | ICD-10-CM | POA: Diagnosis not present

## 2020-03-13 NOTE — Telephone Encounter (Signed)
Patient informed and verbalized understanding.  Patient states that it is not bad and she has taken some allergy relief.

## 2020-04-06 ENCOUNTER — Other Ambulatory Visit: Payer: Self-pay | Admitting: Internal Medicine

## 2020-04-06 MED ORDER — IPRATROPIUM BROMIDE 0.06 % NA SOLN
2.0000 | Freq: Four times a day (QID) | NASAL | 12 refills | Status: DC
Start: 2020-04-06 — End: 2020-07-09

## 2020-04-07 ENCOUNTER — Ambulatory Visit: Payer: Medicare Other | Admitting: Pharmacist

## 2020-04-07 DIAGNOSIS — E785 Hyperlipidemia, unspecified: Secondary | ICD-10-CM

## 2020-04-07 DIAGNOSIS — I700 Atherosclerosis of aorta: Secondary | ICD-10-CM

## 2020-04-07 DIAGNOSIS — E119 Type 2 diabetes mellitus without complications: Secondary | ICD-10-CM

## 2020-04-07 DIAGNOSIS — I152 Hypertension secondary to endocrine disorders: Secondary | ICD-10-CM

## 2020-04-07 DIAGNOSIS — I6529 Occlusion and stenosis of unspecified carotid artery: Secondary | ICD-10-CM

## 2020-04-07 DIAGNOSIS — E1159 Type 2 diabetes mellitus with other circulatory complications: Secondary | ICD-10-CM

## 2020-04-07 NOTE — Chronic Care Management (AMB) (Signed)
Chronic Care Management   Note  04/07/2020 Name: Marissa King MRN: [id number] DOB: 04/17/1946   Subjective:  Marissa King is a 74 y.o. year old female who is a primary care patient of McLean-Scocuzza, Nino Glow, MD. The CCM team was consulted for assistance with chronic disease management and care coordination needs.    Contacted patient for medication management review.  Review of patient status, including review of consultants reports, laboratory and other test data, was performed as part of comprehensive evaluation and provision of chronic care management services.   SDOH (Social Determinants of Health) assessments and interventions performed:  SDOH Interventions     Most Recent Value  SDOH Interventions  Financial Strain Interventions Intervention Not Indicated  Stress Interventions Intervention Not Indicated       Objective:  Lab Results  Component Value Date   CREATININE 1.01 12/18/2019   CREATININE 0.87 02/21/2019   CREATININE 0.83 06/06/2018    Lab Results  Component Value Date   HGBA1C 6.3 12/18/2019       Component Value Date/Time   CHOL 242 (H) 12/18/2019 0903   TRIG 88.0 12/18/2019 0903   HDL 51.80 12/18/2019 0903   CHOLHDL 5 12/18/2019 0903   VLDL 17.6 12/18/2019 0903   LDLCALC 173 (H) 12/18/2019 0903    Clinical ASCVD: No - but carotid artery disease The 10-year ASCVD risk score Mikey Bussing DC Jr., et al., 2013) is: 39.6%   Values used to calculate the score:     Age: 62 years     Sex: Female     Is Non-Hispanic African American: Yes     Diabetic: Yes     Tobacco smoker: No     Systolic Blood Pressure: 664 mmHg     Is BP treated: Yes     HDL Cholesterol: 51.8 mg/dL     Total Cholesterol: 242 mg/dL    BP Readings from Last 3 Encounters:  02/06/20 132/82  10/17/19 (!) 148/84  09/05/19 (!) 196/99    Allergies  Allergen Reactions  . Adhesive  [Tape] Hives    Tape glue  . Alendronate Other (See Comments)    Stabbing pain in breast  . Alprazolam  Other (See Comments)    Made anxious  . Amlodipine Other (See Comments)    Hives, [place] out, Black spots on face, Did not keep blood pressure down.   . Atorvastatin Other (See Comments)  . Benzalkonium Chloride Other (See Comments)    Made skin blister   . Clindamycin Other (See Comments)    Pt states that pill that is blue and red she cannot take due feeling like she was on edge  . Clonazepam Other (See Comments)    Headache   . Clonidine Other (See Comments)    Hurt chest    . Escitalopram Oxalate Other (See Comments)    Made anxious    . Gabapentin Other (See Comments)    A bad headache    . Hydrocodone-Acetaminophen Other (See Comments)    Made pain worse  . Iodinated Diagnostic Agents Swelling  . Iodine Other (See Comments)    Allergic to shell fish and x-ray dye   . Labetalol Other (See Comments)    Felt like veins are on fire   . Levofloxacin Other (See Comments)    Made me shake  . Lisinopril-Hydrochlorothiazide Other (See Comments)    Was in a blue and pale color   . Livalo [Pitavastatin]     Rash and nausea   . Lorazepam  Other (See Comments)    Formula changed   . Losartan Potassium Other (See Comments)    Blood pressure    . Losartan Potassium-Hctz Other (See Comments)    Could not feel arms  . Meloxicam Other (See Comments)    Fatal blood pressure problems will cause strokes   . Metformin Nausea And Vomiting  . Metoclopramide Other (See Comments)    No air was moving, anxious    . Oxcarbazepine Other (See Comments)    Felt like she was chocking    . Penicillins   . Plavix [Clopidogrel]     Arms and legs like going to break in 1/2 per pt   . Potassium Citrate   . Pravastatin Other (See Comments)  . Rosuvastatin Other (See Comments)  . Shellfish Allergy Swelling  . Simvastatin Other (See Comments)  . Sitagliptin Other (See Comments)    "Made toes feel weird"  . Sodium Citrate Other (See Comments)    Teeth broke off   . Spironolactone      Breast pain    . Sulfa Antibiotics Other (See Comments) and Itching  . Zetia [Ezetimibe]     Muscle aches     Medications Reviewed Today    Reviewed by De Hollingshead, [facility] (Pharmacist) on 04/07/20 at 1052  Med List Status: <None>  Medication Order Taking? Sig Documenting Provider Last Dose Status Informant  albuterol (PROVENTIL HFA;VENTOLIN HFA) 108 (90 Base) MCG/ACT inhaler [id number] No Inhale into the lungs.  Patient not taking: Reported on 02/06/2020   [provider] Not Taking Active   aspirin EC 81 MG tablet [id number] Yes Take 81 mg by mouth daily. [provider] Taking Active   Azilsartan-Chlorthalidone 40-12.5 MG TABS [id number] Yes Take 1 tablet by mouth every morning. For blood McLean-Scocuzza, Nino Glow, MD Taking Active   budesonide-formoterol [facility]) 160-4.5 MCG/ACT inhaler [id number] Yes Inhale 2 puffs into the lungs 2 (two) times daily. Rinse mouth for asthma McLean-Scocuzza, Nino Glow, MD Taking Active   Evolocumab (REPATHA) 140 MG/ML SOSY [id number] Yes Inject 140 mg into the skin every 14 (fourteen) days. McLean-Scocuzza, Nino Glow, MD Taking Active   glucose blood (PRECISION QID TEST) test strip [id number] Yes Check sugars 1-2 times daily. 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 Goal blood pressure <130/<80 for blood pressure McLean-Scocuzza, Nino Glow, MD Taking Active   Insulin Pen Needle (PEN NEEDLES) 30G X 8 MM MISC [id number] Yes 1 Device by Does not apply route every 14 (fourteen) days. For repatha or preferred pen needle dispense McLean-Scocuzza, Nino Glow, MD Taking Active   ipratropium (ATROVENT) 0.06 % nasal spray [id number] Yes Place 2 sprays into the nose 4 (four) times daily. McLean-Scocuzza, Nino Glow, MD Taking Active   loratadine (CLARITIN) 10 MG tablet [id number] Yes Take 10 mg by mouth daily as needed for  allergies. [provider] Taking Active   LORazepam (ATIVAN) 0.5 MG tablet [id number] No Take 0.5-1 tablets (0.25-0.5 mg total) by mouth daily as needed.  Patient not taking: Reported on 04/07/2020   McLean-Scocuzza, Nino Glow, MD Not Taking Active   Multiple Vitamins-Minerals (MULTIVITAMIN WOMENS 50+ ADV PO) [id number] Yes Take by mouth. [provider] Taking Active   nebivolol (BYSTOLIC) 2.5 MG tablet [id number] Yes Take 1 tablet (2.5 mg total) by mouth daily. For blood pressure <130/<80 if elevated can take 2.5 if needed total up  to 7.5 mg daily max McLean-Scocuzza, Nino Glow, MD Taking Active   nebivolol (BYSTOLIC) 5 MG tablet [id number] Yes Take 1 tablet (5 mg total) by mouth at bedtime. For blood pressure <130/<80 if elevated can take 2.5 if needed total up to 7.5 mg daily max McLean-Scocuzza, Nino Glow, MD Taking Active   [facility] LANCETS FINE MISC [id number] Yes USE TO CHECK BLOOD SUGAR 1-2 TIMES DAILY [provider] Taking Active   Polyethyl Glycol-Propyl Glycol (SYSTANE FREE OP) [id number] Yes Apply 1 drop to eye daily. [provider] Taking Active   sitaGLIPtin (JANUVIA) 25 MG tablet [id number] Yes Take 1 tablet (25 mg total) by mouth daily. McLean-Scocuzza, Nino Glow, MD Taking Active   VITAMIN D PO [id number] Yes Take 5,000 Units by mouth daily.  [provider] Taking Active            Assessment:   Goals Addressed              This Visit's Progress     Patient Stated   .  PharmD "I want to be healthy" (pt-stated)        CARE PLAN ENTRY (see longitudinal plan of care for additional care plan information)  Current Barriers:  . Social, financial, community barriers:  o Denies any medication cost concerns at this time, Medicare Dual Complete  o Reports that she wants to find a new eye doctor, as she doesn't like the one she has been seeing. Doesn't feel like she is being listened to . Polypharmacy; complex patient with multiple  comorbidities including carotid artery stenosis, HTN, HLD, T2DM, chronic lung disease, anxiety . Self-manages medications by using a weekly pill box . Most recent eGFR: 65 mL/min o HLD, carotid artery stenosis: reports family hx high LDL; hx intolerance to statins d/t myalgias (atorvastatin, pitavastatin, pravastatin, rosuvastatin, simvastatin, ezetimibe). Prescribed Repatha, baseline LDL 173. Reports that she had difficulty giving herself the first injection, feels like 1/2-3/4 of injection was lost. Notes that she needed both hands to depress gray cap, so she was unable to pinch skin to stabilize. Due for her second injection. Has not contacted the pharmacy to discuss, but plans on going by there today. Does note some hardness/knottiness at previous injection site. - Reports that she avoids fried foods and those that list high cholesterol levels. Does floor exercises at home, wishes she would walk more but notes that stiffness/injuries prevent more walking.  o HTN: hx difficult to control HTN. Currently using nebivolol 5-7.5 mg daily (notes that she splits up the dose into 1/2 tablet doses), azilsartan/chlorthalidone 40/12.5 mg daily, hydralazine 50 mg Q6H. Reports home BP controlled at goal <140 at this time o T2DM: Januvia 25 mg daily; last LDL at goal <7% - Hx intolerance to metformin documented d/t N/V. o ASCVD risk reduction: ASA 81 mg daily o COPD/Asthma: dx per documentation, no hx spirometry on file. Reports using Symbicort 160/4.5 mcg PRN SOB. No use of albuterol recently. Also using cetirizine PRN and ipratropium intranasal PRN allergy symptoms, which she reports contribute to breathing concerns o Anxiety: script for lorazepam, but she reports that she took once and could not tolerate. Believes it is the particular generic version she received, but denies alternative pharmacotherapy at this time  Pharmacist Clinical Goal(s):  Marland Kitchen Over the next 90 days, patient will work with PharmD and  provider towards optimized medication management  Interventions: . Comprehensive medication review performed; medication list updated in electronic medical record . Inter-disciplinary care team collaboration (see  longitudinal plan of care) . Will communicate patient's request for new ophthalmology referral to PCP . Counseled of risk of progression of CV disease w/ elevated LDL, and importance of pharmacotherapy given how elevated her LDL is. Discussed that exercise more likely to benefit HDL, and that dietary interventions unlikely to get her to goal. Discussed genetic components of high cholesterol. Encouraged to take "faulty" Repatha pen and next dose to her pharmacy for assistance in injection technique. Discussed that injection site knot is likely worth control of her cholesterol and reduced risk of ASCVD. She verbalized understanding.   Patient Self Care Activities:  . Patient will take medications as prescribed . Patient will take Repatha by the local pharmacy for assistance in troubleshooting.   Initial goal documentation        Plan: - Scheduled f/u call in ~4 weeks  Catie Darnelle Maffucci, PharmD, [place], [place] Pharmacist [place] [phone]

## 2020-04-07 NOTE — Patient Instructions (Signed)
Marissa King,   It was great talking to you today!  Take the Repatha by the pharmacy and ask them to watch you do your second injection. Please call me if you don't feel like they were really able to help you, and we can schedule a time for you to come by the office for me to help you with the injection.   Keep up the great work with taking your medications and keeping an eye on your blood pressure.   Call me with any questions or concerns!  Catie Darnelle Maffucci, PharmD, [place], CPP Clinical Pharmacist [place] [phone]   Visit Information  Goals Addressed              This Visit's Progress     Patient Stated   .  PharmD "I want to be healthy" (pt-stated)        CARE PLAN ENTRY (see longitudinal plan of care for additional care plan information)  Current Barriers:  . Social, financial, community barriers:  o Denies any medication cost concerns at this time, Medicare Dual Complete  o Reports that she wants to find a new eye doctor, as she doesn't like the one she has been seeing. Doesn't feel like she is being listened to . Polypharmacy; complex patient with multiple comorbidities including carotid artery stenosis, HTN, HLD, T2DM, chronic lung disease, anxiety . Self-manages medications by using a weekly pill box . Most recent eGFR: 65 mL/min o HLD, carotid artery stenosis: reports family hx high LDL; hx intolerance to statins d/t myalgias (atorvastatin, pitavastatin, pravastatin, rosuvastatin, simvastatin, ezetimibe). Prescribed Repatha, baseline LDL 173. Reports that she had difficulty giving herself the first injection, feels like 1/2-3/4 of injection was lost. Notes that she needed both hands to depress gray cap, so she was unable to pinch skin to stabilize. Due for her second injection. Has not contacted the pharmacy to discuss, but plans on going by there today. Does note some hardness/knottiness at previous injection  site. - Reports that she avoids fried foods and those that list high cholesterol levels. Does floor exercises at home, wishes she would walk more but notes that stiffness/injuries prevent more walking.  o HTN: hx difficult to control HTN. Currently using nebivolol 5-7.5 mg daily (notes that she splits up the dose into 1/2 tablet doses), azilsartan/chlorthalidone 40/12.5 mg daily, hydralazine 50 mg Q6H. Reports home BP controlled at goal <140 at this time o T2DM: Januvia 25 mg daily; last LDL at goal <7% - Hx intolerance to metformin documented d/t N/V. o ASCVD risk reduction: ASA 81 mg daily o COPD/Asthma: dx per documentation, no hx spirometry on file. Reports using Symbicort 160/4.5 mcg PRN SOB. No use of albuterol recently. Also using cetirizine PRN and ipratropium intranasal PRN allergy symptoms, which she reports contribute to breathing concerns o Anxiety: script for lorazepam, but she reports that she took once and could not tolerate. Believes it is the particular generic version she received, but denies alternative pharmacotherapy at this time  Pharmacist Clinical Goal(s):  Marland Kitchen Over the next 90 days, patient will work with PharmD and provider towards optimized medication management  Interventions: . Comprehensive medication review performed; medication list updated in electronic medical record . Inter-disciplinary care team collaboration (see longitudinal plan of care) . Will communicate patient's request for new ophthalmology referral to PCP . Counseled of risk of progression of CV disease w/ elevated LDL, and importance of pharmacotherapy given how elevated her LDL is. Discussed that exercise more likely to  benefit HDL, and that dietary interventions unlikely to get her to goal. Discussed genetic components of high cholesterol. Encouraged to take "faulty" Repatha pen and next dose to her pharmacy for assistance in injection technique. Discussed that injection site knot is likely worth control  of her cholesterol and reduced risk of ASCVD. She verbalized understanding.   Patient Self Care Activities:  . Patient will take medications as prescribed . Patient will take Repatha by the local pharmacy for assistance in troubleshooting.   Initial goal documentation        Marissa King was given information about Chronic Care Management services today including:  1. CCM service includes personalized support from designated clinical staff supervised by her physician, including individualized plan of care and coordination with other care providers 2. 24/7 contact phone numbers for assistance for urgent and routine care needs. 3. Service will only be billed when office clinical staff spend 20 minutes or more in a month to coordinate care. 4. Only one practitioner may furnish and bill the service in a calendar month. 5. The patient may stop CCM services at any time (effective at the end of the month) by phone call to the office staff. 6. The patient will be responsible for cost sharing (co-pay) of up to 20% of the service fee (after annual deductible is met).  Patient agreed to services and verbal consent obtained.   The patient verbalized understanding of instructions provided today and agreed to receive a mailed copy of patient instruction and/or educational materials.  Plan: - Scheduled f/u call in ~4 weeks  Catie Darnelle Maffucci, PharmD, [place], [place] Pharmacist [place] [phone]

## 2020-04-13 DIAGNOSIS — M4802 Spinal stenosis, cervical region: Secondary | ICD-10-CM | POA: Diagnosis not present

## 2020-04-13 DIAGNOSIS — R2689 Other abnormalities of gait and mobility: Secondary | ICD-10-CM | POA: Diagnosis not present

## 2020-04-20 ENCOUNTER — Other Ambulatory Visit: Payer: Self-pay | Admitting: Internal Medicine

## 2020-04-20 DIAGNOSIS — I10 Essential (primary) hypertension: Secondary | ICD-10-CM

## 2020-04-20 MED ORDER — AZILSARTAN-CHLORTHALIDONE 40-12.5 MG PO TABS: 1.0000 | ORAL_TABLET | Freq: Every morning | ORAL | 3 refills | Status: DC

## 2020-04-30 ENCOUNTER — Ambulatory Visit (INDEPENDENT_AMBULATORY_CARE_PROVIDER_SITE_OTHER): Payer: Medicare Other

## 2020-04-30 VITALS — Ht 64.0 in | Wt 179.0 lb

## 2020-04-30 DIAGNOSIS — Z0000 Encounter for general adult medical examination without abnormal findings: Secondary | ICD-10-CM

## 2020-04-30 NOTE — Progress Notes (Signed)
Subjective:   Marissa King is a 74 y.o. female who presents for Medicare Annual (Subsequent) preventive examination.  Review of Systems    No ROS.  Medicare Wellness Virtual Visit.   Cardiac Risk Factors include: advanced age (>59men, >52 women);diabetes mellitus     Objective:    Today's Vitals   04/30/20 1104  Weight: 179 lb (81.2 kg)  Height: 5\' 4"  (1.626 m)   Body mass index is 30.73 kg/m.  Advanced Directives 04/30/2020 04/30/2019  Does Patient Have a Medical Advance Directive? Yes No  Type of Paramedic of [place];Living will -  Does patient want to make changes to medical advance directive? No - Patient declined Yes (MAU/Ambulatory/Procedural Areas - Information given)  Copy of [place] in Chart? No - copy requested -    Current Medications (verified) Outpatient Encounter Medications as of 04/30/2020  Medication Sig  . albuterol (PROVENTIL HFA;VENTOLIN HFA) 108 (90 Base) MCG/ACT inhaler Inhale into the lungs. (Patient not taking: Reported on 02/06/2020)  . aspirin EC 81 MG tablet Take 81 mg by mouth daily.  . Azilsartan-Chlorthalidone 40-12.5 MG TABS Take 1 tablet by mouth every morning. For blood  . budesonide-formoterol (SYMBICORT) 160-4.5 MCG/ACT inhaler Inhale 2 puffs into the lungs 2 (two) times daily. Rinse mouth for asthma  . Evolocumab (REPATHA) 140 MG/ML SOSY Inject 140 mg into the skin every 14 (fourteen) days.  Marland Kitchen glucose blood (PRECISION QID TEST) test strip Check sugars 1-2 times daily. 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 hydrALAZINE (APRESOLINE) 50 MG tablet Take 1 tablet (50 mg total) by mouth 3 (three) times daily. Goal blood pressure <130/<80 for blood pressure  . Insulin Pen Needle (PEN NEEDLES) 30G X 8 MM MISC 1 Device by Does not apply route every 14 (fourteen) days. For repatha or preferred pen needle dispense  . ipratropium (ATROVENT) 0.06 % nasal spray Place 2 sprays into the nose 4 (four) times daily.  Marland Kitchen  loratadine (CLARITIN) 10 MG tablet Take 10 mg by mouth daily as needed for allergies.  Marland Kitchen LORazepam (ATIVAN) 0.5 MG tablet Take 0.5-1 tablets (0.25-0.5 mg total) by mouth daily as needed. (Patient not taking: Reported on 04/07/2020)  . Multiple Vitamins-Minerals (MULTIVITAMIN WOMENS 50+ ADV PO) Take by mouth.  . nebivolol (BYSTOLIC) 2.5 MG tablet Take 1 tablet (2.5 mg total) by mouth daily. For blood pressure <130/<80 if elevated can take 2.5 if needed total up to 7.5 mg daily max  . nebivolol (BYSTOLIC) 5 MG tablet Take 1 tablet (5 mg total) by mouth at bedtime. For blood pressure <130/<80 if elevated can take 2.5 if needed total up to 7.5 mg daily max  . ONETOUCH DELICA LANCETS FINE MISC USE TO CHECK BLOOD SUGAR 1-2 TIMES DAILY  . Polyethyl Glycol-Propyl Glycol (SYSTANE FREE OP) Apply 1 drop to eye daily.  . sitaGLIPtin (JANUVIA) 25 MG tablet Take 1 tablet (25 mg total) by mouth daily.  Marland Kitchen VITAMIN D PO Take 5,000 Units by mouth daily.    No facility-administered encounter medications on file as of 04/30/2020.    Allergies (verified) Adhesive  [tape], Alendronate, Alprazolam, Amlodipine, Atorvastatin, Benzalkonium chloride, Clindamycin, Clonazepam, Clonidine, Escitalopram oxalate, Gabapentin, Hydrocodone-acetaminophen, Iodinated diagnostic agents, Iodine, Labetalol, Levofloxacin, Lisinopril-hydrochlorothiazide, Livalo [pitavastatin], Lorazepam, Losartan potassium, Losartan potassium-hctz, Meloxicam, Metformin, Metoclopramide, Oxcarbazepine, Penicillins, Plavix [clopidogrel], Potassium citrate, Pravastatin, Rosuvastatin, Shellfish allergy, Simvastatin, Sitagliptin, Sodium citrate, Spironolactone, Sulfa antibiotics, and Zetia [ezetimibe]   History: Past Medical History:  Diagnosis Date  . Allergy   . 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 Asthma    with h/o chronic bronchitis  . Back pain    mid back pain h/ o trauma   . Diabetes mellitus without complication ([place])   . Frequent  headaches   . Gastric ulcer   . GERD (gastroesophageal reflux disease)   . History of chicken pox   . Hyperlipidemia   . Hypertension   . Migraines   . Thyroid disease    mng    Past Surgical History:  Procedure Laterality Date  . ABDOMINAL HYSTERECTOMY     DUB 1 ovary intact   . CHOLECYSTECTOMY     09/02/19   Family History  Problem Relation Age of Onset  . Alcohol abuse Mother   . Heart disease Mother        CHF  . Hypertension Mother   . Hyperlipidemia Mother   . Stroke Mother   . Mental illness Mother   . Alcohol abuse Father   . Cancer Father        pancreatitic   . Arthritis Sister   . COPD Sister   . Depression Sister   . Drug abuse Sister   . Hearing loss Sister   . Heart disease Sister        CHF  . Hyperlipidemia Sister   . Hypertension Sister   . Mental illness Sister   . COPD Brother   . Depression Brother   . Heart disease Brother   . Hyperlipidemia Brother   . Stroke Brother   . Asthma Son   . Arthritis Brother   . Depression Brother   . Heart disease Brother   . Hyperlipidemia Brother   . Mental illness Brother   . Mental illness Brother   . Hyperlipidemia Brother   . Drug abuse Brother   . Diabetes Brother   . Depression Brother   . COPD Brother   . Birth defects Brother   . Arthritis Brother    Social History   Socioeconomic History  . Marital status: Widowed    Spouse name: Not on file  . Number of children: Not on file  . Years of education: Not on file  . Highest education level: Not on file  Occupational History  . Not on file  Tobacco Use  . Smoking status: Never Smoker  . Smokeless tobacco: Never Used  Substance and Sexual Activity  . Alcohol use: No  . Drug use: No  . Sexual activity: Not Currently  Other Topics Concern  . Not on file  Social History Narrative   2 sons, 4 pregnancies    Brother is [name]    Never smoker exposed 2nd hand    No guns    Wears seat belt    Safe in relationship, widowed lives  alone   2 year college ed    Social Determinants of Health   Financial Resource Strain: Low Risk   . Difficulty of Paying Living Expenses: Not hard at all  Food Insecurity: No Food Insecurity  . Worried About Charity fundraiser in the Last Year: Never true  . Ran Out of Food in the Last Year: Never true  Transportation Needs: No Transportation Needs  . Lack of Transportation (Medical): No  . Lack of Transportation (Non-Medical): No  Physical Activity:   . Days of Exercise per Week: Not on file  . Minutes of Exercise per Session: Not on file  Stress: No Stress Concern Present  .  Feeling of Stress : Not at all  Social Connections: Unknown  . Frequency of Communication with Friends and Family: More than three times a week  . Frequency of Social Gatherings with Friends and Family: Not on file  . Attends Religious Services: Not on file  . Active Member of Clubs or Organizations: Not on file  . Attends Archivist Meetings: Not on file  . Marital Status: Not on file    Tobacco Counseling Counseling given: Not Answered   Clinical Intake:  Pre-visit preparation completed: Yes        Diabetes: Yes (Followed by pcp)  How often do you need to have someone help you when you read instructions, pamphlets, or other written materials from your doctor or pharmacy?: 1 - Never  Interpreter Needed?: No      Activities of Daily Living In your present state of health, do you have any difficulty performing the following activities: 04/30/2020  Hearing? N  Vision? N  Difficulty concentrating or making decisions? N  Walking or climbing stairs? Y  Comment Cane/walker in use as needed  Dressing or bathing? N  Doing errands, shopping? N  Preparing Food and eating ? N  Using the Toilet? N  In the past six months, have you accidently leaked urine? N  Do you have problems with loss of bowel control? N  Managing your Medications? N  Managing your Finances? N  Housekeeping or  managing your Housekeeping? N  Some recent data might be hidden    Patient Care Team: McLean-Scocuzza, Nino Glow, MD as PCP - General (Internal Medicine) De Hollingshead, RPH-CPP as Pharmacist (Pharmacist)  Indicate any recent Medical Services you may have received from other than [facility] providers in the past year (date may be approximate).     Assessment:   This is a routine wellness examination for [facility].  I connected with Raelynn today by telephone and verified that I am speaking with the correct person using two identifiers. Location patient: home Location provider: work Persons participating in the virtual visit: patient, Marine scientist.    I discussed the limitations, risks, security and privacy concerns of performing an evaluation and management service by telephone and the availability of in person appointments. The patient expressed understanding and verbally consented to this telephonic visit.    Interactive audio and video telecommunications were attempted between this provider and patient, however failed, due to patient having technical difficulties OR patient did not have access to video capability.  We continued and completed visit with audio only.  Some vital signs may be absent or patient reported.   Hearing/Vision screen  Hearing Screening   125Hz  250Hz  500Hz  1000Hz  2000Hz  3000Hz  4000Hz  6000Hz  8000Hz   Right ear:           Left ear:           Comments: Patient is able to hear conversational tones without difficulty.  No issues reported.  Vision Screening Comments: Cataract extraction, bilateral  Visual acuity not assessed, virtual visit.   Dietary issues and exercise activities discussed: Current Exercise Habits: Home exercise routine, Type of exercise: walking, Intensity: Mild  Depression Screen PHQ 2/9 Scores 04/30/2020 10/17/2019 05/22/2019 04/30/2019 02/13/2019 06/06/2018  PHQ - 2 Score 0 0 0 0 0 0    Fall Risk Fall Risk  04/30/2020 02/06/2020 10/17/2019 05/22/2019 04/30/2019    Falls in the past year? 0 1 1 0 0  Number falls in past yr: 0 0 0 - -  Injury with Fall? -  1 1 - -  Risk for fall due to : - History of fall(s) - - -  Follow up Falls evaluation completed Falls evaluation completed Falls evaluation completed - -   Handrails in use when climbing stairs? Yes Home free of loose throw rugs in walkways, pet beds, electrical cords, etc? Yes  Adequate lighting in your home to reduce risk of falls? Yes   ASSISTIVE DEVICES UTILIZED TO PREVENT FALLS: Life alert? No  Use of a cane, walker or w/c? Yes  Grab bars in the bathroom? Yes  Shower chair or bench in shower? Yes  Elevated toilet seat or a handicapped toilet? No   TIMED UP AND GO: Was the test performed? No .   Cognitive Function: Patient is alert and oriented x3.  Denies difficulty with focusing, making decisions, memory loss.   Enjoys brain challenging activities for brain health.    6CIT Screen 04/30/2019  What Year? 0 points  What month? 0 points  What time? 0 points  Count back from 20 0 points  Months in reverse 0 points  Repeat phrase 0 points  Total Score 0   Immunizations Immunization History  Administered Date(s) Administered  . PFIZER SARS-COV-2 Vaccination 02/19/2020, 03/13/2020  . Pneumococcal Conjugate-13 10/17/2016  . Pneumococcal Polysaccharide-23 02/08/2018   Health Maintenance Health Maintenance  Topic Date Due  . TETANUS/TDAP  Never done  . OPHTHALMOLOGY EXAM  10/14/2019  . HEMOGLOBIN A1C  06/19/2020  . MAMMOGRAM  10/08/2020  . FOOT EXAM  01/20/2021  . COLONOSCOPY  05/18/2026  . DEXA SCAN  Completed  . COVID-19 Vaccine  Completed  . Hepatitis C Screening  Completed  . PNA vac Low Risk Adult  Completed  . INFLUENZA VACCINE  Discontinued    Dental Screening: Recommended annual dental exams for proper oral hygiene.  Community Resource Referral / Chronic Care Management: CRR required this visit?  No   CCM required this visit?  No      Plan:   Keep all  routine maintenance appointments.   Next scheduled lab 06/08/20  Follow up CCM 05/01/20  Cpe 06/10/20 @ 1:30  Mammogram and Dexa Scan scheduled 05/07/20  I have personally reviewed and noted the following in the patient's chart:   . Medical and social history . Use of alcohol, tobacco or illicit drugs  . Current medications and supplements . Functional ability and status . Nutritional status . Physical activity . Advanced directives . List of other physicians . Hospitalizations, surgeries, and ER visits in previous 12 months . Vitals . Screenings to include cognitive, depression, and falls . Referrals and appointments  In addition, I have reviewed and discussed with patient certain preventive protocols, quality metrics, and best practice recommendations. A written personalized care plan for preventive services as well as general preventive health recommendations were provided to patient via mail.     Varney Biles, LPN   11/17/6220

## 2020-04-30 NOTE — Patient Instructions (Addendum)
[name] , Thank you for taking time to come for your Medicare Wellness Visit. I appreciate your ongoing commitment to your health goals. Please review the following plan we discussed and let me know if I can assist you in the future.   These are the goals we discussed: Goals      Patient Stated     Follow up with Primary Care Provider (pt-stated)      As needed      PharmD "I want to be healthy" (pt-stated)      CARE PLAN ENTRY (see longitudinal plan of care for additional care plan information)  Current Barriers:   Social, financial, community barriers:  o Denies any medication cost concerns at this time, Medicare Dual Complete  o Reports that she wants to find a new eye doctor, as she doesn't like the one she has been seeing. Doesn't feel like she is being listened to  Polypharmacy; complex patient with multiple comorbidities including carotid artery stenosis, HTN, HLD, T2DM, chronic lung disease, anxiety  Self-manages medications by using a weekly pill box  Most recent eGFR: 65 mL/min o HLD, carotid artery stenosis: reports family hx high LDL; hx intolerance to statins d/t myalgias (atorvastatin, pitavastatin, pravastatin, rosuvastatin, simvastatin, ezetimibe). Prescribed Repatha, baseline LDL 173. Reports that she had difficulty giving herself the first injection, feels like 1/2-3/4 of injection was lost. Notes that she needed both hands to depress gray cap, so she was unable to pinch skin to stabilize. Due for her second injection. Has not contacted the pharmacy to discuss, but plans on going by there today. Does note some hardness/knottiness at previous injection site. - Reports that she avoids fried foods and those that list high cholesterol levels. Does floor exercises at home, wishes she would walk more but notes that stiffness/injuries prevent more walking.  o HTN: hx difficult to control HTN. Currently using nebivolol 5-7.5 mg daily (notes that she splits up the dose  into 1/2 tablet doses), azilsartan/chlorthalidone 40/12.5 mg daily, hydralazine 50 mg Q6H. Reports home BP controlled at goal <140 at this time o T2DM: Januvia 25 mg daily; last LDL at goal <7% - Hx intolerance to metformin documented d/t N/V. o ASCVD risk reduction: ASA 81 mg daily o COPD/Asthma: dx per documentation, no hx spirometry on file. Reports using Symbicort 160/4.5 mcg PRN SOB. No use of albuterol recently. Also using cetirizine PRN and ipratropium intranasal PRN allergy symptoms, which she reports contribute to breathing concerns o Anxiety: script for lorazepam, but she reports that she took once and could not tolerate. Believes it is the particular generic version she received, but denies alternative pharmacotherapy at this time  Pharmacist Clinical Goal(s):   Over the next 90 days, patient will work with PharmD and provider towards optimized medication management  Interventions:  Comprehensive medication review performed; medication list updated in electronic medical record  Inter-disciplinary care team collaboration (see longitudinal plan of care)  Will communicate patient's request for new ophthalmology referral to PCP  Counseled of risk of progression of CV disease w/ elevated LDL, and importance of pharmacotherapy given how elevated her LDL is. Discussed that exercise more likely to benefit HDL, and that dietary interventions unlikely to get her to goal. Discussed genetic components of high cholesterol. Encouraged to take "faulty" Repatha pen and next dose to her pharmacy for assistance in injection technique. Discussed that injection site knot is likely worth control of her cholesterol and reduced risk of ASCVD. She verbalized understanding.   Patient Self  Care Activities:   Patient will take medications as prescribed  Patient will take Repatha by the local pharmacy for assistance in troubleshooting.   Initial goal documentation        This is a list of the  screening recommended for you and due dates:  Health Maintenance  Topic Date Due   Tetanus Vaccine  Never done   Eye exam for diabetics  10/14/2019   Hemoglobin A1C  06/19/2020   Mammogram  10/08/2020   Complete foot exam   01/20/2021   Colon Cancer Screening  05/18/2026   DEXA scan (bone density measurement)  Completed   COVID-19 Vaccine  Completed    Hepatitis C: One time screening is recommended by Center for Disease Control  (CDC) for  adults born from [birth year] through 1964.   Completed   Pneumonia vaccines  Completed   Flu Shot  Discontinued    Immunizations Immunization History  Administered Date(s) Administered   PFIZER SARS-COV-2 Vaccination 02/19/2020, 03/13/2020   Pneumococcal Conjugate-13 10/17/2016   Pneumococcal Polysaccharide-23 02/08/2018   Advanced directives: mailed per request  Conditions/risks identified: none new  Follow up in one year for your annual wellness visit.   Preventive Care 40 Years and Older, Female Preventive care refers to lifestyle choices and visits with your health care provider that can promote health and wellness. What does preventive care include?  A yearly physical exam. This is also called an annual well check.  Dental exams once or twice a year.  Routine eye exams. Ask your health care provider how often you should have your eyes checked.  Personal lifestyle choices, including:  Daily care of your teeth and gums.  Regular physical activity.  Eating a healthy diet.  Avoiding tobacco and drug use.  Limiting alcohol use.  Practicing safe sex.  Taking low-dose aspirin every day.  Taking vitamin and mineral supplements as recommended by your health care provider. What happens during an annual well check? The services and screenings done by your health care provider during your annual well check will depend on your age, overall health, lifestyle risk factors, and family history of disease. Counseling  Your  health care provider may ask you questions about your:  Alcohol use.  Tobacco use.  Drug use.  Emotional well-being.  Home and relationship well-being.  Sexual activity.  Eating habits.  History of falls.  Memory and ability to understand (cognition).  Work and work Statistician.  Reproductive health. Screening  You may have the following tests or measurements:  Height, weight, and BMI.  Blood pressure.  Lipid and cholesterol levels. These may be checked every 5 years, or more frequently if you are over 42 years old.  Skin check.  Lung cancer screening. You may have this screening every year starting at age 61 if you have a 30-pack-year history of smoking and currently smoke or have quit within the past 15 years.  Fecal occult blood test (FOBT) of the stool. You may have this test every year starting at age 39.  Flexible sigmoidoscopy or colonoscopy. You may have a sigmoidoscopy every 5 years or a colonoscopy every 10 years starting at age 41.  Hepatitis C blood test.  Hepatitis B blood test.  Sexually transmitted disease (STD) testing.  Diabetes screening. This is done by checking your blood sugar (glucose) after you have not eaten for a while (fasting). You may have this done every 1-3 years.  Bone density scan. This is done to screen for osteoporosis. You may have this  done starting at age 40.  Mammogram. This may be done every 1-2 years. Talk to your health care provider about how often you should have regular mammograms. Talk with your health care provider about your test results, treatment options, and if necessary, the need for more tests. Vaccines  Your health care provider may recommend certain vaccines, such as:  Influenza vaccine. This is recommended every year.  Tetanus, diphtheria, and acellular pertussis (Tdap, Td) vaccine. You may need a Td booster every 10 years.  Zoster vaccine. You may need this after age 9.  Pneumococcal 13-valent  conjugate (PCV13) vaccine. One dose is recommended after age 18.  Pneumococcal polysaccharide (PPSV23) vaccine. One dose is recommended after age 43. Talk to your health care provider about which screenings and vaccines you need and how often you need them. This information is not intended to replace advice given to you by your health care provider. Make sure you discuss any questions you have with your health care provider. Document Released: 08/14/2015 Document Revised: 04/06/2016 Document Reviewed: 05/19/2015 Elsevier Interactive Patient Education  2017 [employer] Prevention in the Home Falls can cause injuries. They can happen to people of all ages. There are many things you can do to make your home safe and to help prevent falls. What can I do on the outside of my home?  Regularly fix the edges of walkways and driveways and fix any cracks.  Remove anything that might make you trip as you walk through a door, such as a raised step or threshold.  Trim any bushes or trees on the path to your home.  Use bright outdoor lighting.  Clear any walking paths of anything that might make someone trip, such as rocks or tools.  Regularly check to see if handrails are loose or broken. Make sure that both sides of any steps have handrails.  Any raised decks and porches should have guardrails on the edges.  Have any leaves, snow, or ice cleared regularly.  Use sand or salt on walking paths during winter.  Clean up any spills in your garage right away. This includes oil or grease spills. What can I do in the bathroom?  Use night lights.  Install grab bars by the toilet and in the tub and shower. Do not use towel bars as grab bars.  Use non-skid mats or decals in the tub or shower.  If you need to sit down in the shower, use a plastic, non-slip stool.  Keep the floor dry. Clean up any water that spills on the floor as soon as it happens.  Remove soap buildup in the tub or  shower regularly.  Attach bath mats securely with double-sided non-slip rug tape.  Do not have throw rugs and other things on the floor that can make you trip. What can I do in the bedroom?  Use night lights.  Make sure that you have a light by your bed that is easy to reach.  Do not use any sheets or blankets that are too big for your bed. They should not hang down onto the floor.  Have a firm chair that has side arms. You can use this for support while you get dressed.  Do not have throw rugs and other things on the floor that can make you trip. What can I do in the kitchen?  Clean up any spills right away.  Avoid walking on wet floors.  Keep items that you use a lot in easy-to-reach places.  If you need to reach something above you, use a strong step stool that has a grab bar.  Keep electrical cords out of the way.  Do not use floor polish or wax that makes floors slippery. If you must use wax, use non-skid floor wax.  Do not have throw rugs and other things on the floor that can make you trip. What can I do with my stairs?  Do not leave any items on the stairs.  Make sure that there are handrails on both sides of the stairs and use them. Fix handrails that are broken or loose. Make sure that handrails are as long as the stairways.  Check any carpeting to make sure that it is firmly attached to the stairs. Fix any carpet that is loose or worn.  Avoid having throw rugs at the top or bottom of the stairs. If you do have throw rugs, attach them to the floor with carpet tape.  Make sure that you have a light switch at the top of the stairs and the bottom of the stairs. If you do not have them, ask someone to add them for you. What else can I do to help prevent falls?  Wear shoes that:  Do not have high heels.  Have rubber bottoms.  Are comfortable and fit you well.  Are closed at the toe. Do not wear sandals.  If you use a stepladder:  Make sure that it is fully  opened. Do not climb a closed stepladder.  Make sure that both sides of the stepladder are locked into place.  Ask someone to hold it for you, if possible.  Clearly mark and make sure that you can see:  Any grab bars or handrails.  First and last steps.  Where the edge of each step is.  Use tools that help you move around (mobility aids) if they are needed. These include:  Canes.  Walkers.  Scooters.  Crutches.  Turn on the lights when you go into a dark area. Replace any light bulbs as soon as they burn out.  Set up your furniture so you have a clear path. Avoid moving your furniture around.  If any of your floors are uneven, fix them.  If there are any pets around you, be aware of where they are.  Review your medicines with your doctor. Some medicines can make you feel dizzy. This can increase your chance of falling. Ask your doctor what other things that you can do to help prevent falls. This information is not intended to replace advice given to you by your health care provider. Make sure you discuss any questions you have with your health care provider. Document Released: 05/14/2009 Document Revised: 12/24/2015 Document Reviewed: 08/22/2014 Elsevier Interactive Patient Education  2017 [employer].

## 2020-05-01 ENCOUNTER — Ambulatory Visit (INDEPENDENT_AMBULATORY_CARE_PROVIDER_SITE_OTHER): Payer: Medicare Other | Admitting: Pharmacist

## 2020-05-01 DIAGNOSIS — E785 Hyperlipidemia, unspecified: Secondary | ICD-10-CM | POA: Diagnosis not present

## 2020-05-01 DIAGNOSIS — E119 Type 2 diabetes mellitus without complications: Secondary | ICD-10-CM | POA: Diagnosis not present

## 2020-05-01 DIAGNOSIS — I10 Essential (primary) hypertension: Secondary | ICD-10-CM

## 2020-05-01 NOTE — Patient Instructions (Addendum)
Visit Information Patient Care Plan: Wellness (Adult)    Problem Identified: Medication Adherence (Wellness)     Goal: Medication Adherence Maintained   Start Date: 05/01/2020  This Visit's Progress: Not on track  Priority: High  Note:   - Learn to self-inject Repatha   Task: Optimize Medication Use   Note:   Care Management Activities:    - barriers to medication adherence identified - counseling by pharmacist provided    Notes: Patient to come to clinic for face to face teaching     Goals Addressed              This Visit's Progress     Patient Stated   .  PharmD "I want to be healthy" (pt-stated)        CARE PLAN ENTRY (see longitudinal plan of care for additional care plan information)  Current Barriers:  . Social, financial, community barriers:  o Still having difficulty with Repatha injection. Notes she took the pen by the dispensing pharmacy and they were unable to help her. . Polypharmacy; complex patient with multiple comorbidities including carotid artery stenosis, HTN, HLD, T2DM, chronic lung disease, anxiety . Self-manages medications by using a weekly pill box . Most recent eGFR: 65 mL/min o HLD, carotid artery stenosis: reports family hx high LDL;  - hx intolerance to statins d/t myalgias (atorvastatin, pitavastatin, pravastatin, rosuvastatin, simvastatin, ezetimibe).  o Prescribed Repatha, baseline LDL 173.  o HTN: hx difficult to control HTN. Currently using nebivolol 5-7.5 mg daily (notes that she splits up the dose into 1/2 tablet doses), azilsartan/chlorthalidone 40/12.5 mg daily, hydralazine 50 mg Q6H.  o T2DM: Januvia 25 mg daily; last LDL at goal <7% - Hx intolerance to metformin documented d/t N/V. o ASCVD risk reduction: ASA 81 mg daily o COPD/Asthma: dx per documentation, no hx spirometry on file. Symbicort 160/4.5 mcg PRN SOB; Cetirizine PRN and ipratropium intranasal PRN allergy symptoms o Anxiety: script for lorazepam, but she reports that  she took once and could not tolerate. e  Pharmacist Clinical Goal(s):  . Over the next 90 days, patient will work with PharmD and provider towards optimized medication management  Interventions: . Discussed injection technique. Patient would be most comfortable coming back in office for re-teaching with me. Scheduled face to face visit next week.   Patient Self Care Activities:  . Patient will take medications as prescribed . Patient will bring Repatha to clinic next week for administration coaching   Please see past updates related to this goal by clicking on the "Past Updates" button in the selected goal         The patient verbalized understanding of instructions provided today and declined a print copy of patient instruction materials.   Plan:  - Will meet with patient face to face next week for Repatha teaching  Catie Travis, PharmD, BCACP, CPP Clinical Pharmacist [facility] [phone]  

## 2020-05-01 NOTE — Chronic Care Management (AMB) (Signed)
Chronic Care Management   Follow Up Note   05/01/2020 Name: Marissa King MRN: [id number] DOB: Apr 17, 1946  Referred by: McLean-Scocuzza, Nino Glow, MD Reason for referral : Chronic Care Management   Marissa King is a 74 y.o. year old female who is a primary care patient of McLean-Scocuzza, Nino Glow, MD. The CCM team was consulted for assistance with chronic disease management and care coordination needs.    Review of patient status, including review of consultants reports, relevant laboratory and other test results, and collaboration with appropriate care team members and the patient's provider was performed as part of comprehensive patient evaluation and provision of chronic care management services.    SDOH (Social Determinants of Health) assessments performed: Yes See Care Plan activities for detailed interventions related to [facility])     Outpatient Encounter Medications as of 05/01/2020  Medication Sig  . albuterol (PROVENTIL HFA;VENTOLIN HFA) 108 (90 Base) MCG/ACT inhaler Inhale into the lungs. (Patient not taking: Reported on 02/06/2020)  . aspirin EC 81 MG tablet Take 81 mg by mouth daily.  . Azilsartan-Chlorthalidone 40-12.5 MG TABS Take 1 tablet by mouth every morning. For blood  . budesonide-formoterol (SYMBICORT) 160-4.5 MCG/ACT inhaler Inhale 2 puffs into the lungs 2 (two) times daily. Rinse mouth for asthma  . Evolocumab (REPATHA) 140 MG/ML SOSY Inject 140 mg into the skin every 14 (fourteen) days.  Marland Kitchen glucose blood (PRECISION QID TEST) test strip Check sugars 1-2 times daily. 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 hydrALAZINE (APRESOLINE) 50 MG tablet Take 1 tablet (50 mg total) by mouth 3 (three) times daily. Goal blood pressure <130/<80 for blood pressure  . Insulin Pen Needle (PEN NEEDLES) 30G X 8 MM MISC 1 Device by Does not apply route every 14 (fourteen) days. For repatha or preferred pen needle dispense  . ipratropium (ATROVENT) 0.06 % nasal spray Place 2 sprays into the nose 4 (four)  times daily.  Marland Kitchen loratadine (CLARITIN) 10 MG tablet Take 10 mg by mouth daily as needed for allergies.  Marland Kitchen LORazepam (ATIVAN) 0.5 MG tablet Take 0.5-1 tablets (0.25-0.5 mg total) by mouth daily as needed. (Patient not taking: Reported on 04/07/2020)  . Multiple Vitamins-Minerals (MULTIVITAMIN WOMENS 50+ ADV PO) Take by mouth.  . nebivolol (BYSTOLIC) 2.5 MG tablet Take 1 tablet (2.5 mg total) by mouth daily. For blood pressure <130/<80 if elevated can take 2.5 if needed total up to 7.5 mg daily max  . nebivolol (BYSTOLIC) 5 MG tablet Take 1 tablet (5 mg total) by mouth at bedtime. For blood pressure <130/<80 if elevated can take 2.5 if needed total up to 7.5 mg daily max  . ONETOUCH DELICA LANCETS FINE MISC USE TO CHECK BLOOD SUGAR 1-2 TIMES DAILY  . Polyethyl Glycol-Propyl Glycol (SYSTANE FREE OP) Apply 1 drop to eye daily.  . sitaGLIPtin (JANUVIA) 25 MG tablet Take 1 tablet (25 mg total) by mouth daily.  Marland Kitchen VITAMIN D PO Take 5,000 Units by mouth daily.    No facility-administered encounter medications on file as of 05/01/2020.     Objective:   Patient Care Plan: Wellness (Adult)    Problem Identified: Medication Adherence (Wellness)     Goal: Medication Adherence Maintained   Start Date: 05/01/2020  This Visit's Progress: Not on track  Priority: High  Note:   - Learn to self-inject Repatha   Task: Optimize Medication Use   Note:   Care Management Activities:    - barriers to medication adherence identified - counseling by pharmacist provided    Notes:  Patient to come to clinic for face to face teaching      Goals Addressed              This Visit's Progress     Patient Stated   .  PharmD "I want to be healthy" (pt-stated)        CARE PLAN ENTRY (see longitudinal plan of care for additional care plan information)  Current Barriers:  . Social, financial, community barriers:  o Still having difficulty with Repatha injection. Notes she took the pen by the dispensing  pharmacy and they were unable to help her. . Polypharmacy; complex patient with multiple comorbidities including carotid artery stenosis, HTN, HLD, T2DM, chronic lung disease, anxiety . Self-manages medications by using a weekly pill box . Most recent eGFR: 65 mL/min o HLD, carotid artery stenosis: reports family hx high LDL;  - hx intolerance to statins d/t myalgias (atorvastatin, pitavastatin, pravastatin, rosuvastatin, simvastatin, ezetimibe).  o Prescribed Repatha, baseline LDL 173.  o HTN: hx difficult to control HTN. Currently using nebivolol 5-7.5 mg daily (notes that she splits up the dose into 1/2 tablet doses), azilsartan/chlorthalidone 40/12.5 mg daily, hydralazine 50 mg Q6H.  o T2DM: Januvia 25 mg daily; last LDL at goal <7% - Hx intolerance to metformin documented d/t N/V. o ASCVD risk reduction: ASA 81 mg daily o COPD/Asthma: dx per documentation, no hx spirometry on file. Symbicort 160/4.5 mcg PRN SOB; Cetirizine PRN and ipratropium intranasal PRN allergy symptoms o Anxiety: script for lorazepam, but she reports that she took once and could not tolerate. e  Pharmacist Clinical Goal(s):  Marland Kitchen Over the next 90 days, patient will work with PharmD and provider towards optimized medication management  Interventions: . Discussed injection technique. Patient would be most comfortable coming back in office for re-teaching with me. Scheduled face to face visit next week.   Patient Self Care Activities:  . Patient will take medications as prescribed . Patient will bring Repatha to clinic next week for administration coaching   Please see past updates related to this goal by clicking on the "Past Updates" button in the selected goal          Plan:  - Will meet with patient face to face next week for Repatha teaching  Catie Darnelle Maffucci, PharmD, [place], [place] Pharmacist [place] [phone]

## 2020-05-05 ENCOUNTER — Ambulatory Visit: Payer: Medicare Other | Admitting: Pharmacist

## 2020-05-05 ENCOUNTER — Other Ambulatory Visit: Payer: Self-pay

## 2020-05-05 DIAGNOSIS — I6529 Occlusion and stenosis of unspecified carotid artery: Secondary | ICD-10-CM

## 2020-05-05 DIAGNOSIS — E785 Hyperlipidemia, unspecified: Secondary | ICD-10-CM | POA: Diagnosis not present

## 2020-05-05 DIAGNOSIS — I700 Atherosclerosis of aorta: Secondary | ICD-10-CM

## 2020-05-05 DIAGNOSIS — E119 Type 2 diabetes mellitus without complications: Secondary | ICD-10-CM | POA: Diagnosis not present

## 2020-05-05 DIAGNOSIS — I10 Essential (primary) hypertension: Secondary | ICD-10-CM | POA: Diagnosis not present

## 2020-05-05 NOTE — Patient Instructions (Signed)
Visit Information  Goals Addressed              This Visit's Progress     Patient Stated   .  PharmD "I want to be healthy" (pt-stated)        CARE PLAN ENTRY (see longitudinal plan of care for additional care plan information)  Current Barriers:  . Social, financial, community barriers:  o None noted at this time . Polypharmacy; complex patient with multiple comorbidities including carotid artery stenosis, HTN, HLD, T2DM, chronic lung disease, anxiety . Self-manages medications by using a weekly pill box . Most recent eGFR: 65 mL/min o HLD, carotid artery stenosis: reports family hx high LDL; Started on Repatha 140 mg Q14  - Baseline LDL 173; 10 year ASCVD risk 29.6% - hx intolerance to statins d/t myalgias (atorvastatin, pitavastatin, pravastatin, rosuvastatin, simvastatin, ezetimibe). o HTN: hx difficult to control HTN. Currently using nebivolol 5-7.5 mg daily (splits TID), azilsartan/chlorthalidone 40/12.5 mg daily, hydralazine 50 mg Q6H.  o T2DM: Januvia 25 mg daily; last LDL at goal <7% - Hx intolerance to metformin documented d/t N/V. o ASCVD risk reduction: ASA 81 mg daily o COPD/Asthma: dx per documentation, no hx spirometry on file. Symbicort 160/4.5 mcg PRN SOB; Cetirizine PRN and ipratropium intranasal PRN allergy symptoms o Anxiety: script for lorazepam, but she reports that she took once and could not tolerate.  Pharmacist Clinical Goal(s):  Marland Kitchen Over the next 90 days, patient will work with PharmD and provider towards optimized medication management  Interventions: . Sat with patient and verbalized administration instructions as she administered Repatha. She was not pinching skin to create a stable place to push against. She thinks that moving forward, she should be able to do this, and may ask her granddaughter to help her stabilize injection . Unsure if patient received enough injection with her past injections to give therapeutic benefit. Would consider today her  first day of a full dose. Recheck lipid panel in 4-12 weeks.   Patient Self Care Activities:  . Patient will take medications as prescribed  Please see past updates related to this goal by clicking on the "Past Updates" button in the selected goal         The patient verbalized understanding of instructions provided today and declined a print copy of patient instruction materials.   Plan:  - Scheduled f/u phone call in ~ 8 weeks (PCP f/u in 4 weeks)  Catie Darnelle Maffucci, PharmD, [place], [place] Pharmacist [place] [phone]

## 2020-05-05 NOTE — Chronic Care Management (AMB) (Signed)
Chronic Care Management   Follow Up Note   05/05/2020 Name: Marissa King MRN: [id number] DOB: Jan 07, 1946  Referred by: McLean-Scocuzza, Nino Glow, MD Reason for referral : Chronic Care Management (Medication Management)   Marissa King is a 74 y.o. year old female who is a primary care patient of McLean-Scocuzza, Nino Glow, MD. The CCM team was consulted for assistance with chronic disease management and care coordination needs.    Met with patient face to face for medication administration education  Review of patient status, including review of consultants reports, relevant laboratory and other test results, and collaboration with appropriate care team members and the patient's provider was performed as part of comprehensive patient evaluation and provision of chronic care management services.    SDOH (Social Determinants of Health) assessments performed: No See Care Plan activities for detailed interventions related to [facility])     Outpatient Encounter Medications as of 05/05/2020  Medication Sig   albuterol (PROVENTIL HFA;VENTOLIN HFA) 108 (90 Base) MCG/ACT inhaler Inhale into the lungs. (Patient not taking: Reported on 02/06/2020)   aspirin EC 81 MG tablet Take 81 mg by mouth daily.   Azilsartan-Chlorthalidone 40-12.5 MG TABS Take 1 tablet by mouth every morning. For blood   budesonide-formoterol (SYMBICORT) 160-4.5 MCG/ACT inhaler Inhale 2 puffs into the lungs 2 (two) times daily. Rinse mouth for asthma   Evolocumab (REPATHA) 140 MG/ML SOSY Inject 140 mg into the skin every 14 (fourteen) days.   glucose blood (PRECISION QID TEST) test strip Check sugars 1-2 times daily. 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 Goal blood pressure <130/<80 for blood pressure   Insulin Pen Needle (PEN NEEDLES) 30G X 8 MM MISC 1 Device by Does not apply route every 14 (fourteen) days. For repatha or preferred pen  needle dispense   ipratropium (ATROVENT) 0.06 % nasal spray Place 2 sprays into the nose 4 (four) times daily.   loratadine (CLARITIN) 10 MG tablet Take 10 mg by mouth daily as needed for allergies.   LORazepam (ATIVAN) 0.5 MG tablet Take 0.5-1 tablets (0.25-0.5 mg total) by mouth daily as needed. (Patient not taking: Reported on 04/07/2020)   Multiple Vitamins-Minerals (MULTIVITAMIN WOMENS 50+ ADV PO) Take by mouth.   nebivolol (BYSTOLIC) 2.5 MG tablet Take 1 tablet (2.5 mg total) by mouth daily. For blood pressure <130/<80 if elevated can take 2.5 if needed total up to 7.5 mg daily max   nebivolol (BYSTOLIC) 5 MG tablet Take 1 tablet (5 mg total) by mouth at bedtime. For blood pressure <130/<80 if elevated can take 2.5 if needed total up to 7.5 mg daily max   ONETOUCH DELICA LANCETS FINE MISC USE TO CHECK BLOOD SUGAR 1-2 TIMES DAILY   Polyethyl Glycol-Propyl Glycol (SYSTANE FREE OP) Apply 1 drop to eye daily.   sitaGLIPtin (JANUVIA) 25 MG tablet Take 1 tablet (25 mg total) by mouth daily.   VITAMIN D PO Take 5,000 Units by mouth daily.    No facility-administered encounter medications on file as of 05/05/2020.     Objective:   Goals Addressed              This Visit's Progress     Patient Stated     PharmD "I want to be healthy" (pt-stated)        CARE PLAN ENTRY (see longitudinal plan of care for additional care plan information)  Current Barriers:   Social, financial, community barriers:  o None noted at this time  Polypharmacy; complex patient with multiple comorbidities including carotid artery stenosis, HTN, HLD, T2DM, chronic lung disease, anxiety  Self-manages medications by using a weekly pill box  Most recent eGFR: 65 mL/min o HLD, carotid artery stenosis: reports family hx high LDL; Started on Repatha 140 mg Q14  - Baseline LDL 173; 10 year ASCVD risk 29.6% - hx intolerance to statins d/t myalgias (atorvastatin, pitavastatin, pravastatin, rosuvastatin,  simvastatin, ezetimibe). o HTN: hx difficult to control HTN. Currently using nebivolol 5-7.5 mg daily (splits TID), azilsartan/chlorthalidone 40/12.5 mg daily, hydralazine 50 mg Q6H.  o T2DM: Januvia 25 mg daily; last LDL at goal <7% - Hx intolerance to metformin documented d/t N/V. o ASCVD risk reduction: ASA 81 mg daily o COPD/Asthma: dx per documentation, no hx spirometry on file. Symbicort 160/4.5 mcg PRN SOB; Cetirizine PRN and ipratropium intranasal PRN allergy symptoms o Anxiety: script for lorazepam, but she reports that she took once and could not tolerate.  Pharmacist Clinical Goal(s):   Over the next 90 days, patient will work with PharmD and provider towards optimized medication management  Interventions:  Sat with patient and verbalized administration instructions as she administered Repatha. She was not pinching skin to create a stable place to push against. She thinks that moving forward, she should be able to do this, and may ask her granddaughter to help her stabilize injection  Unsure if patient received enough injection with her past injections to give therapeutic benefit. Would consider today her first day of a full dose. Recheck lipid panel in 4-12 weeks.   Patient Self Care Activities:   Patient will take medications as prescribed  Please see past updates related to this goal by clicking on the "Past Updates" button in the selected goal          Plan:  - Scheduled f/u phone call in ~ 8 weeks (PCP f/u in 4 weeks)  Catie Darnelle Maffucci, PharmD, [place], [place] Pharmacist [place] [phone]

## 2020-05-07 ENCOUNTER — Ambulatory Visit
Admission: RE | Admit: 2020-05-07 | Discharge: 2020-05-07 | Disposition: A | Discharge status: Home / Self Care | Payer: Medicare Other | Source: Ambulatory Visit | Attending: Internal Medicine | Admitting: Internal Medicine

## 2020-05-07 ENCOUNTER — Other Ambulatory Visit: Payer: Self-pay

## 2020-05-07 DIAGNOSIS — Z1231 Encounter for screening mammogram for malignant neoplasm of breast: Secondary | ICD-10-CM | POA: Diagnosis not present

## 2020-05-07 DIAGNOSIS — E2839 Other primary ovarian failure: Secondary | ICD-10-CM

## 2020-05-07 DIAGNOSIS — Z780 Asymptomatic menopausal state: Secondary | ICD-10-CM | POA: Diagnosis not present

## 2020-05-07 DIAGNOSIS — M8589 Other specified disorders of bone density and structure, multiple sites: Secondary | ICD-10-CM | POA: Diagnosis not present

## 2020-05-08 ENCOUNTER — Ambulatory Visit: Payer: Medicare Other

## 2020-05-13 DIAGNOSIS — M4802 Spinal stenosis, cervical region: Secondary | ICD-10-CM | POA: Diagnosis not present

## 2020-05-13 DIAGNOSIS — R2689 Other abnormalities of gait and mobility: Secondary | ICD-10-CM | POA: Diagnosis not present

## 2020-05-21 ENCOUNTER — Ambulatory Visit: Payer: Medicare Other | Admitting: Sports Medicine

## 2020-06-03 ENCOUNTER — Telehealth: Payer: Self-pay | Admitting: Internal Medicine

## 2020-06-03 NOTE — Telephone Encounter (Signed)
Faxed response requested to close potential [place] in therapy for statin therapy faxed on 06-03-2020

## 2020-06-04 ENCOUNTER — Ambulatory Visit (INDEPENDENT_AMBULATORY_CARE_PROVIDER_SITE_OTHER): Payer: Medicare Other | Admitting: Sports Medicine

## 2020-06-04 ENCOUNTER — Other Ambulatory Visit: Payer: Self-pay

## 2020-06-04 ENCOUNTER — Encounter: Payer: Self-pay | Admitting: Sports Medicine

## 2020-06-04 DIAGNOSIS — M25572 Pain in left ankle and joints of left foot: Secondary | ICD-10-CM | POA: Diagnosis not present

## 2020-06-04 DIAGNOSIS — S93402D Sprain of unspecified ligament of left ankle, subsequent encounter: Secondary | ICD-10-CM

## 2020-06-04 DIAGNOSIS — M779 Enthesopathy, unspecified: Secondary | ICD-10-CM | POA: Diagnosis not present

## 2020-06-04 DIAGNOSIS — M25472 Effusion, left ankle: Secondary | ICD-10-CM

## 2020-06-04 NOTE — Progress Notes (Signed)
Subjective: Marissa King is a 74 y.o. female patient who returns to office for follow up evaluation of left ankle pain. Reports she is still having some pain in her ankle worse now at the back and the side of the ankle states that she could not wear the brace it would not stay on but the supportive sleeve seems to be helping a little especially with the swelling.  Patient denies any new trauma or injury at this time.  Patient Active Problem List   Diagnosis Date Noted  . Hypertension associated with diabetes ([place]) 02/06/2020  . Vision loss, right eye 02/06/2020  . Myopathy, unspecified 02/06/2020  . Chronic midline low back pain without sciatica 10/17/2019  . S/P laparoscopic cholecystectomy 09/24/2019  . Cataract 09/05/2019  . Insomnia 09/05/2019  . Atrial tachycardia ([place]) 09/05/2019  . Anxiety 09/05/2019  . 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 Combined forms of age-related cataract of both eyes 07/12/2019  . Carotid artery stenosis 06/23/2019  . MRI of brain abnormal 06/23/2019  . Gallstones 05/22/2019  . Thyroid nodule 05/22/2019  . Leg edema 02/13/2019  . Cervicalgia 10/04/2018  . Mid back pain 10/04/2018  . Toe pain, left 06/07/2018  . Arthritis 06/07/2018  . Type 2 diabetes mellitus without complication, without long-term current use of insulin ([place]) 06/07/2018  . Multinodular goiter 06/07/2018  . Essential hypertension 06/07/2018  . Hyperlipidemia 06/07/2018  . Chest pain 06/07/2018  . Mild intermittent asthma 06/07/2018  . Local edema 02/02/2018  . Skin infection 10/06/2016  . Osteopenia of multiple sites 10/05/2016  . Cervical radiculopathy 09/10/2016  . Barrett's esophagus with dysplasia 04/23/2016  . Chronic constipation 09/28/2015  . COPD (chronic obstructive pulmonary disease) ([place]) 09/28/2015  . GERD (gastroesophageal reflux disease) 09/28/2015  . History of stroke 09/28/2015  . Migraine 09/28/2015  . Obesity 09/28/2015  . Small  vessel disease ([place]) 09/28/2015  . Vasomotor rhinitis 09/28/2015    Current Outpatient Medications on File Prior to Visit  Medication Sig Dispense Refill  . albuterol (PROVENTIL HFA;VENTOLIN HFA) 108 (90 Base) MCG/ACT inhaler Inhale into the lungs. (Patient not taking: Reported on 02/06/2020)    . aspirin EC 81 MG tablet Take 81 mg by mouth daily.    . Azilsartan-Chlorthalidone 40-12.5 MG TABS Take 1 tablet by mouth every morning. For blood 90 tablet 3  . budesonide-formoterol (SYMBICORT) 160-4.5 MCG/ACT inhaler Inhale 2 puffs into the lungs 2 (two) times daily. Rinse mouth for asthma 1 Inhaler 11  . Evolocumab (REPATHA) 140 MG/ML SOSY Inject 140 mg into the skin every 14 (fourteen) days. 2.1 mL 5  . glucose blood (PRECISION QID TEST) test strip Check sugars 1-2 times daily. E11.9 Non-insulin dependent    . hydrALAZINE (APRESOLINE) 50 MG tablet Take 1 tablet (50 mg total) by mouth 3 (three) times daily. Goal blood pressure <130/<80 for blood pressure 270 tablet 3  . Insulin Pen Needle (PEN NEEDLES) 30G X 8 MM MISC 1 Device by Does not apply route every 14 (fourteen) days. For repatha or preferred pen needle dispense 10 each 11  . ipratropium (ATROVENT) 0.06 % nasal spray Place 2 sprays into the nose 4 (four) times daily. 15 mL 12  . loratadine (CLARITIN) 10 MG tablet Take 10 mg by mouth daily as needed for allergies.    Marland Kitchen LORazepam (ATIVAN) 0.5 MG tablet Take 0.5-1 tablets (0.25-0.5 mg total) by mouth daily as needed. (Patient not taking: Reported on 04/07/2020) 30 tablet 2  . Multiple Vitamins-Minerals (MULTIVITAMIN WOMENS 50+  ADV PO) Take by mouth.    . nebivolol (BYSTOLIC) 2.5 MG tablet Take 1 tablet (2.5 mg total) by mouth daily. For blood pressure <130/<80 if elevated can take 2.5 if needed total up to 7.5 mg daily max 90 tablet 3  . nebivolol (BYSTOLIC) 5 MG tablet Take 1 tablet (5 mg total) by mouth at bedtime. For blood pressure <130/<80 if elevated can take 2.5 if needed total up to 7.5 mg  daily max 90 tablet 3  . ONETOUCH DELICA LANCETS FINE MISC USE TO CHECK BLOOD SUGAR 1-2 TIMES DAILY    . Polyethyl Glycol-Propyl Glycol (SYSTANE FREE OP) Apply 1 drop to eye daily.    . sitaGLIPtin (JANUVIA) 25 MG tablet Take 1 tablet (25 mg total) by mouth daily. 90 tablet 3  . VITAMIN D PO Take 5,000 Units by mouth daily.      No current facility-administered medications on file prior to visit.    Allergies  Allergen Reactions  . Adhesive  [Tape] Hives    Tape glue  . Alendronate Other (See Comments)    Stabbing pain in breast  . Alprazolam Other (See Comments)    Made anxious  . Amlodipine Other (See Comments)    Hives, [place] out, Black spots on face, Did not keep blood pressure down.   . Atorvastatin Other (See Comments)  . Benzalkonium Chloride Other (See Comments)    Made skin blister   . Clindamycin Other (See Comments)    Pt states that pill that is blue and red she cannot take due feeling like she was on edge  . Clonazepam Other (See Comments)    Headache   . Clonidine Other (See Comments)    Hurt chest    . Escitalopram Oxalate Other (See Comments)    Made anxious    . Gabapentin Other (See Comments)    A bad headache    . Hydrocodone-Acetaminophen Other (See Comments)    Made pain worse  . Iodinated Diagnostic Agents Swelling  . Iodine Other (See Comments)    Allergic to shell fish and x-ray dye   . Labetalol Other (See Comments)    Felt like veins are on fire   . Levofloxacin Other (See Comments)    Made me shake  . Lisinopril-Hydrochlorothiazide Other (See Comments)    Was in a blue and pale color   . Livalo [Pitavastatin]     Rash and nausea   . Lorazepam Other (See Comments)    Formula changed   . Losartan Potassium Other (See Comments)    Blood pressure    . Losartan Potassium-Hctz Other (See Comments)    Could not feel arms  . Meloxicam Other (See Comments)    Fatal blood pressure problems will cause strokes   . Metformin Nausea And  Vomiting  . Metoclopramide Other (See Comments)    No air was moving, anxious    . Oxcarbazepine Other (See Comments)    Felt like she was chocking    . Penicillins   . Plavix [Clopidogrel]     Arms and legs like going to break in 1/2 per pt   . Potassium Citrate   . Pravastatin Other (See Comments)  . Rosuvastatin Other (See Comments)  . Shellfish Allergy Swelling  . Simvastatin Other (See Comments)  . Sitagliptin Other (See Comments)    "Made toes feel weird"  . Sodium Citrate Other (See Comments)    Teeth broke off   . Spironolactone     Breast  pain    . Sulfa Antibiotics Other (See Comments) and Itching  . Zetia [Ezetimibe]     Muscle aches     Objective:  General: Alert and oriented x3 in no acute distress  Dermatology: No open lesions bilateral lower extremities, no webspace macerations, no ecchymosis bilateral, all nails x 10 are well manicured.  Vascular: Dorsalis Pedis and Posterior Tibial pedal pulses palpable, Capillary Fill Time 3 seconds,(+) pedal hair growth bilateral, focal swelling left ankle, temperature gradient within normal limits.  Neurology: Johney Maine sensation intact via light touch bilateral.  Musculoskeletal: Mild tenderness with palpation at left lateral ankle and to posterior heel at Achilles insertion.  There is mild focal soft tissue swelling that is improving, strength within normal limits in all groups bilateral with mild guarding on left due to pain at the left ankle especially with plantarflexion and inversion of left foot.  Assessment and Plan: Problem List Items Addressed This Visit    None    Visit Diagnoses    Acute left ankle pain    -  Primary   Relevant Orders   MR ANKLE LEFT WO CONTRAST   Sprain of left ankle, unspecified ligament, subsequent encounter       Relevant Orders   MR ANKLE LEFT WO CONTRAST   Tendinitis       Relevant Orders   MR ANKLE LEFT WO CONTRAST   Ankle swelling, left       Relevant Orders   MR ANKLE LEFT  WO CONTRAST   Pain in left ankle and joints of left foot       Relevant Orders   MR ANKLE LEFT WO CONTRAST      -Complete examination performed -Re-Discussed treatment options for ankle sprain with tendinitis and swelling with concern for tear after she had a very bad sprain  -Rx MRI rule out tear at ankle ligaments or achilles  -Rx CAM boot -Continue with Anklet compression sleeve for patient to use during the day to assist with edema control like before -Recommend continue with rest ice elevation avoid strenuous activity or excessive walking to avoid re-injury -Patient to return to office after MRI.  Landis Martins, DPM

## 2020-06-08 ENCOUNTER — Other Ambulatory Visit: Payer: Medicare Other

## 2020-06-09 ENCOUNTER — Other Ambulatory Visit: Payer: Self-pay

## 2020-06-09 ENCOUNTER — Other Ambulatory Visit (INDEPENDENT_AMBULATORY_CARE_PROVIDER_SITE_OTHER): Payer: Medicare Other

## 2020-06-09 DIAGNOSIS — E871 Hypo-osmolality and hyponatremia: Secondary | ICD-10-CM

## 2020-06-09 DIAGNOSIS — E119 Type 2 diabetes mellitus without complications: Secondary | ICD-10-CM

## 2020-06-09 DIAGNOSIS — Z20822 Contact with and (suspected) exposure to covid-19: Secondary | ICD-10-CM

## 2020-06-09 DIAGNOSIS — I10 Essential (primary) hypertension: Secondary | ICD-10-CM

## 2020-06-09 LAB — COMPREHENSIVE METABOLIC PANEL
ALT: 12 U/L (ref 0–35)
AST: 18 U/L (ref 0–37)
Albumin: 4 g/dL (ref 3.5–5.2)
Alkaline Phosphatase: 68 U/L (ref 39–117)
BUN: 8 mg/dL (ref 6–23)
CO2: 31 mEq/L (ref 19–32)
Calcium: 9.8 mg/dL (ref 8.4–10.5)
Chloride: 97 mEq/L (ref 96–112)
Creatinine, Ser: 0.96 mg/dL (ref 0.40–1.20)
GFR: 58.73 mL/min — ABNORMAL LOW (ref >60.00)
Glucose, Bld: 105 mg/dL — ABNORMAL HIGH (ref 70–99)
Potassium: 4.2 mEq/L (ref 3.5–5.1)
Sodium: 134 mEq/L — ABNORMAL LOW (ref 135–145)
Total Bilirubin: 0.6 mg/dL (ref 0.2–1.2)
Total Protein: 6.9 g/dL (ref 6.0–8.3)

## 2020-06-09 LAB — CBC WITH DIFFERENTIAL/PLATELET
Basophils Absolute: 0 10*3/uL (ref 0.0–0.1)
Basophils Relative: 0.9 % (ref 0.0–3.0)
Eosinophils Absolute: 0.1 10*3/uL (ref 0.0–0.7)
Eosinophils Relative: 2.1 % (ref 0.0–5.0)
HCT: 43.3 % (ref 36.0–46.0)
Hemoglobin: 14.7 g/dL (ref 12.0–15.0)
Lymphocytes Relative: 28.1 % (ref 12.0–46.0)
Lymphs Abs: 1.2 10*3/uL (ref 0.7–4.0)
MCHC: 33.9 g/dL (ref 30.0–36.0)
MCV: 87.1 fl (ref 78.0–100.0)
Monocytes Absolute: 0.5 10*3/uL (ref 0.1–1.0)
Monocytes Relative: 11.4 % (ref 3.0–12.0)
Neutro Abs: 2.5 10*3/uL (ref 1.4–7.7)
Neutrophils Relative %: 57.5 % (ref 43.0–77.0)
Platelets: 234 10*3/uL (ref 150.0–400.0)
RBC: 4.97 Mil/uL (ref 3.87–5.11)
RDW: 12.9 % (ref 11.5–15.5)
WBC: 4.4 10*3/uL (ref 4.0–10.5)

## 2020-06-10 ENCOUNTER — Ambulatory Visit (INDEPENDENT_AMBULATORY_CARE_PROVIDER_SITE_OTHER): Payer: Medicare Other | Admitting: Internal Medicine

## 2020-06-10 ENCOUNTER — Encounter: Payer: Self-pay | Admitting: Internal Medicine

## 2020-06-10 ENCOUNTER — Other Ambulatory Visit: Payer: Self-pay

## 2020-06-10 VITALS — BP 147/83 | HR 70 | Temp 98.4°F | Ht 64.02 in | Wt 184.6 lb

## 2020-06-10 DIAGNOSIS — R0789 Other chest pain: Secondary | ICD-10-CM

## 2020-06-10 DIAGNOSIS — H259 Unspecified age-related cataract: Secondary | ICD-10-CM | POA: Insufficient documentation

## 2020-06-10 DIAGNOSIS — F419 Anxiety disorder, unspecified: Secondary | ICD-10-CM

## 2020-06-10 DIAGNOSIS — E669 Obesity, unspecified: Secondary | ICD-10-CM

## 2020-06-10 DIAGNOSIS — I10 Essential (primary) hypertension: Secondary | ICD-10-CM

## 2020-06-10 DIAGNOSIS — E785 Hyperlipidemia, unspecified: Secondary | ICD-10-CM | POA: Diagnosis not present

## 2020-06-10 DIAGNOSIS — R7303 Prediabetes: Secondary | ICD-10-CM

## 2020-06-10 DIAGNOSIS — E1169 Type 2 diabetes mellitus with other specified complication: Secondary | ICD-10-CM

## 2020-06-10 DIAGNOSIS — E049 Nontoxic goiter, unspecified: Secondary | ICD-10-CM

## 2020-06-10 DIAGNOSIS — E041 Nontoxic single thyroid nodule: Secondary | ICD-10-CM

## 2020-06-10 DIAGNOSIS — E1159 Type 2 diabetes mellitus with other circulatory complications: Secondary | ICD-10-CM

## 2020-06-10 DIAGNOSIS — I152 Hypertension secondary to endocrine disorders: Secondary | ICD-10-CM

## 2020-06-10 DIAGNOSIS — I701 Atherosclerosis of renal artery: Secondary | ICD-10-CM

## 2020-06-10 LAB — SAR COV2 SEROLOGY (COVID19)AB(IGG),IA
SARS-CoV-2 Semi-Quant IgG Ab: 462 [AU]/ml
SARS-CoV-2 Spike Ab Interp: POSITIVE

## 2020-06-10 LAB — MICROALBUMIN / CREATININE URINE RATIO
Creatinine, Urine: 49 mg/dL (ref 20–275)
Microalb Creat Ratio: 16 mcg/mg creat (ref <30)
Microalb, Ur: 0.8 mg/dL

## 2020-06-10 LAB — SODIUM, URINE, RANDOM: Sodium, Ur: 31 mmol/L (ref 28–272)

## 2020-06-10 MED ORDER — CARVEDILOL 12.5 MG PO TABS: 12.5000 mg | ORAL_TABLET | Freq: Two times a day (BID) | ORAL | 3 refills | Status: DC

## 2020-06-10 MED ORDER — HYDRALAZINE HCL 50 MG PO TABS: 50.0000 mg | ORAL_TABLET | Freq: Three times a day (TID) | ORAL | 3 refills | Status: DC

## 2020-06-10 NOTE — Patient Instructions (Addendum)
Beet supplements or hibiscus tea Consider covid booster 10/12/19 pfizer Dr. Buddy Duty (Endocrine)  And thyroid Ultrasound will schedule [facility] or [facility]   Dr. Buddy Duty thyroid doctor   [facility]  Stop Nebivolol 2.5 and 5 and change to coreg 12.5 2x per day with food   Carvedilol Tablets What is this medicine? CARVEDILOL (KAR ve dil ol) is a beta blocker. It decreases the amount of work your heart has to do and helps your heart beat regularly. It treats high blood pressure. This medicine may be used for other purposes; ask your health care provider or pharmacist if you have questions. COMMON BRAND NAME(S): Coreg What should I tell my health care provider before I take this medicine? They need to know if you have any of these conditions:  circulation problems  diabetes  history of heart attack or heart disease  liver disease  lung or breathing disease, like asthma or emphysema  pheochromocytoma  slow or irregular heartbeat  thyroid disease  an unusual or allergic reaction to carvedilol, other beta-blockers, medicines, foods, dyes, or preservatives  pregnant or trying to get pregnant  breast-feeding How should I use this medicine? Take this drug by mouth. Take it as directed on the prescription label at the same time every day. Take it with food. Keep taking it unless your health care provider tells you to stop. Talk to your health care provider about the use of this drug in children. Special care may be needed. Overdosage: If you think you have taken too much of this medicine contact a poison control center or emergency room at once. NOTE: This medicine is only for you. Do not share this medicine with others. What if I miss a dose? If you miss a dose, take it as soon as you can. If it is almost time for your next dose, take only that dose. Do not take double or extra doses. What may interact with this medicine? This  medicine may interact with the following medications:  certain medicines for blood pressure, heart disease, irregular heart beat  certain medicines for depression, like fluoxetine or paroxetine  certain medicines for diabetes, like glipizide or glyburide  cimetidine  clonidine  cyclosporine  digoxin  MAOIs like Carbex, Eldepryl, Marplan, Nardil, and Parnate  reserpine  rifampin This list may not describe all possible interactions. Give your health care provider a list of all the medicines, herbs, non-prescription drugs, or dietary supplements you use. Also tell them if you smoke, drink alcohol, or use illegal drugs. Some items may interact with your medicine. What should I watch for while using this medicine? Check your heart rate and blood pressure regularly while you are taking this medicine. Ask your doctor or health care professional what your heart rate and blood pressure should be, and when you should contact him or her. Do not stop taking this medicine suddenly. This could lead to serious heart-related effects. Contact your doctor or health care professional if you have difficulty breathing while taking this drug. Check your weight daily. Ask your doctor or health care professional when you should notify him/her of any weight gain. You may get drowsy or dizzy. Do not drive, use machinery, or do anything that requires mental alertness until you know how this medicine affects you. To reduce the risk of dizzy or fainting spells, do not sit or stand up quickly. Alcohol can make you more drowsy, and increase flushing and rapid heartbeats. Avoid  alcoholic drinks. This medicine may increase blood sugar. Ask your healthcare provider if changes in diet or medicines are needed if you have diabetes. If you are going to have surgery, tell your doctor or health care professional that you are taking this medicine. What side effects may I notice from receiving this medicine? Side effects that  you should report to your doctor or health care professional as soon as possible:  allergic reactions like skin rash, itching or hives, swelling of the face, lips, or tongue  breathing problems  dark urine  irregular heartbeat   signs and symptoms of high blood sugar such as being more thirsty or hungry or having to urinate more than normal. You may also feel very tired or have blurry vision.  swollen legs or ankles  vomiting  yellowing of the eyes or skin Side effects that usually do not require medical attention (report to your doctor or health care professional if they continue or are bothersome):  change in sex drive or performance  diarrhea  dry eyes (especially if wearing contact lenses)  dry, itching skin  headache  nausea  unusually tired This list may not describe all possible side effects. Call your doctor for medical advice about side effects. You may report side effects to FDA at 1-800-FDA-1088. Where should I keep my medicine? Keep out of the reach of children and pets. Store at room temperature between 20 and 25 degrees C (68 and 77 degrees F). Protect from moisture. Keep the container tightly closed. Throw away any unused drug after the expiration date. NOTE: This sheet is a summary. It may not cover all possible information. If you have questions about this medicine, talk to your doctor, pharmacist, or health care provider.  2020 Elsevier/Gold Standard (2019-02-22 17:42:09)    DASH Eating Plan DASH stands for "Dietary Approaches to Stop Hypertension." The DASH eating plan is a healthy eating plan that has been shown to reduce high blood pressure (hypertension). It may also reduce your risk for type 2 diabetes, heart disease, and stroke. The DASH eating plan may also help with weight loss. What are tips for following this plan?  General guidelines  Avoid eating more than 2,300 mg (milligrams) of salt (sodium) a day. If you have hypertension, you may  need to reduce your sodium intake to 1,500 mg a day.  Limit alcohol intake to no more than 1 drink a day for nonpregnant women and 2 drinks a day for men. One drink equals 12 oz of beer, 5 oz of wine, or 1 oz of hard liquor.  Work with your health care provider to maintain a healthy body weight or to lose weight. Ask what an ideal weight is for you.  Get at least 30 minutes of exercise that causes your heart to beat faster (aerobic exercise) most days of the week. Activities may include walking, swimming, or biking.  Work with your health care provider or diet and nutrition specialist (dietitian) to adjust your eating plan to your individual calorie needs. Reading food labels   Check food labels for the amount of sodium per serving. Choose foods with less than 5 percent of the Daily Value of sodium. Generally, foods with less than 300 mg of sodium per serving fit into this eating plan.  To find whole grains, look for the word "whole" as the first word in the ingredient list. Shopping  Buy products labeled as "low-sodium" or "no salt added."  Buy fresh foods. Avoid canned foods and premade  or frozen meals. Cooking  Avoid adding salt when cooking. Use salt-free seasonings or herbs instead of table salt or sea salt. Check with your health care provider or pharmacist before using salt substitutes.  Do not fry foods. Cook foods using healthy methods such as baking, boiling, grilling, and broiling instead.  Cook with heart-healthy oils, such as olive, canola, soybean, or sunflower oil. Meal planning  Eat a balanced diet that includes: ? 5 or more servings of fruits and vegetables each day. At each meal, try to fill half of your plate with fruits and vegetables. ? Up to 6-8 servings of whole grains each day. ? Less than 6 oz of lean meat, poultry, or fish each day. A 3-oz serving of meat is about the same size as a deck of cards. One egg equals 1 oz. ? 2 servings of low-fat dairy each  day. ? A serving of nuts, seeds, or beans 5 times each week. ? Heart-healthy fats. Healthy fats called Omega-3 fatty acids are found in foods such as flaxseeds and coldwater fish, like sardines, salmon, and mackerel.  Limit how much you eat of the following: ? Canned or prepackaged foods. ? Food that is high in trans fat, such as fried foods. ? Food that is high in saturated fat, such as fatty meat. ? Sweets, desserts, sugary drinks, and other foods with added sugar. ? Full-fat dairy products.  Do not salt foods before eating.  Try to eat at least 2 vegetarian meals each week.  Eat more home-cooked food and less restaurant, buffet, and fast food.  When eating at a restaurant, ask that your food be prepared with less salt or no salt, if possible. What foods are recommended? The items listed may not be a complete list. Talk with your dietitian about what dietary choices are best for you. Grains Whole-grain or whole-wheat bread. Whole-grain or whole-wheat pasta. Brown rice. [place]. Bulgur. Whole-grain and low-sodium cereals. Pita bread. Low-fat, low-sodium crackers. Whole-wheat flour tortillas. Vegetables Fresh or frozen vegetables (raw, steamed, roasted, or grilled). Low-sodium or reduced-sodium tomato and vegetable juice. Low-sodium or reduced-sodium tomato sauce and tomato paste. Low-sodium or reduced-sodium canned vegetables. Fruits All fresh, dried, or frozen fruit. Canned fruit in natural juice (without added sugar). Meat and other protein foods Skinless chicken or [place]. Ground chicken or [place]. Pork with fat trimmed off. Fish and seafood. Egg whites. Dried beans, peas, or lentils. Unsalted nuts, nut butters, and seeds. Unsalted canned beans. Lean cuts of beef with fat trimmed off. Low-sodium, lean deli meat. Dairy Low-fat (1%) or fat-free (skim) milk. Fat-free, low-fat, or reduced-fat cheeses. Nonfat, low-sodium ricotta or cottage cheese. Low-fat or nonfat yogurt.  Low-fat, low-sodium cheese. Fats and oils Soft margarine without trans fats. Vegetable oil. Low-fat, reduced-fat, or light mayonnaise and salad dressings (reduced-sodium). Canola, safflower, olive, soybean, and sunflower oils. Avocado. Seasoning and other foods Herbs. Spices. Seasoning mixes without salt. Unsalted popcorn and pretzels. Fat-free sweets. What foods are not recommended? The items listed may not be a complete list. Talk with your dietitian about what dietary choices are best for you. Grains Baked goods made with fat, such as croissants, muffins, or some breads. Dry pasta or rice meal packs. Vegetables Creamed or fried vegetables. Vegetables in a cheese sauce. Regular canned vegetables (not low-sodium or reduced-sodium). Regular canned tomato sauce and paste (not low-sodium or reduced-sodium). Regular tomato and vegetable juice (not low-sodium or reduced-sodium). Angie Fava. Olives. Fruits Canned fruit in a light or heavy syrup. Fried fruit. Fruit in  cream or butter sauce. Meat and other protein foods Fatty cuts of meat. Ribs. Fried meat. Berniece Salines. Sausage. Bologna and other processed lunch meats. Salami. Fatback. Hotdogs. Bratwurst. Salted nuts and seeds. Canned beans with added salt. Canned or smoked fish. Whole eggs or egg yolks. Chicken or [place] with skin. Dairy Whole or 2% milk, cream, and half-and-half. Whole or full-fat cream cheese. Whole-fat or sweetened yogurt. Full-fat cheese. Nondairy creamers. Whipped toppings. Processed cheese and cheese spreads. Fats and oils Butter. Stick margarine. Lard. Shortening. Ghee. Bacon fat. Tropical oils, such as coconut, palm kernel, or palm oil. Seasoning and other foods Salted popcorn and pretzels. Onion salt, garlic salt, seasoned salt, table salt, and sea salt. Worcestershire sauce. Tartar sauce. Barbecue sauce. Teriyaki sauce. Soy sauce, including reduced-sodium. Steak sauce. Canned and packaged gravies. Fish sauce. Oyster sauce. Cocktail  sauce. Horseradish that you find on the shelf. Ketchup. Mustard. Meat flavorings and tenderizers. Bouillon cubes. Hot sauce and Tabasco sauce. Premade or packaged marinades. Premade or packaged taco seasonings. Relishes. Regular salad dressings. Where to find more information:  National Heart, Lung, and [place]: https://wilson-eaton.com/  American Heart Association: www.heart.org Summary  The DASH eating plan is a healthy eating plan that has been shown to reduce high blood pressure (hypertension). It may also reduce your risk for type 2 diabetes, heart disease, and stroke.  With the DASH eating plan, you should limit salt (sodium) intake to 2,300 mg a day. If you have hypertension, you may need to reduce your sodium intake to 1,500 mg a day.  When on the DASH eating plan, aim to eat more fresh fruits and vegetables, whole grains, lean proteins, low-fat dairy, and heart-healthy fats.  Work with your health care provider or diet and nutrition specialist (dietitian) to adjust your eating plan to your individual calorie needs. This information is not intended to replace advice given to you by your health care provider. Make sure you discuss any questions you have with your health care provider. Document Revised: 06/30/2017 Document Reviewed: 07/11/2016 Elsevier Patient Education  2020 [employer].

## 2020-06-10 NOTE — Progress Notes (Addendum)
Chief Complaint  Patient presents with  . Hypertension    Pt c/o high blood pressure and headaches   F/u  1. BP elevated and bystolic with increased dose 5 mg she reports this is causing worsening h/a and BP not at goal she takes Azilsartan chlorthalidone 40-12.5 mg qd, hydralazine 50 tid but has been out and was taking bystolic 5 mg but was stopped due to c/w side effects she is having h/a and BP uncontrolled today. She also has some anxiety GAD 7 score 9 today due to trying to help 1 of her brothers with housing  2. Cataracts pending appt [facility] in [place] Dr. Venda Rodes  3. Goiter with thyroid nodules 06/14/18 rec bx pt never had bx of adequate bx per pt  agreeable to see endocrine and have repeat US thyroid   Review of Systems  Constitutional: Negative for weight loss.  HENT: Negative for hearing loss.   Eyes: Negative for blurred vision.  Respiratory: Negative for shortness of breath.   Cardiovascular: Negative for chest pain.  Gastrointestinal: Negative for abdominal pain.  Musculoskeletal: Negative for falls.  Skin: Negative for rash.  Neurological: Positive for headaches.  Psychiatric/Behavioral: The patient is nervous/anxious.    Past Medical History:  Diagnosis Date  . Allergy   . 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 Asthma    with h/o chronic bronchitis  . Back pain    mid back pain h/ o trauma   . Diabetes mellitus without complication ([place])   . Frequent headaches   . Gastric ulcer   . GERD (gastroesophageal reflux disease)   . History of chicken pox   . Hyperlipidemia   . Hypertension   . Migraines   . Thyroid disease    mng    Past Surgical History:  Procedure Laterality Date  . ABDOMINAL HYSTERECTOMY     DUB 1 ovary intact   . CHOLECYSTECTOMY     09/02/19   Family History  Problem Relation Age of Onset  . Alcohol abuse Mother   . Heart disease Mother        CHF  . Hypertension Mother   . Hyperlipidemia Mother   . Stroke Mother    . Mental illness Mother   . Alcohol abuse Father   . Cancer Father        pancreatitic   . Arthritis Sister   . COPD Sister   . Depression Sister   . Drug abuse Sister   . Hearing loss Sister   . Heart disease Sister        CHF  . Hyperlipidemia Sister   . Hypertension Sister   . Mental illness Sister   . COPD Brother   . Depression Brother   . Heart disease Brother   . Hyperlipidemia Brother   . Stroke Brother   . Asthma Son   . Arthritis Brother   . Depression Brother   . Heart disease Brother   . Hyperlipidemia Brother   . Mental illness Brother   . Mental illness Brother   . Hyperlipidemia Brother   . Drug abuse Brother   . Diabetes Brother   . Depression Brother   . COPD Brother   . Birth defects Brother   . Arthritis Brother    Social History   Socioeconomic History  . Marital status: Widowed    Spouse name: Not on file  . Number of children: Not on file  . Years of  education: Not on file  . Highest education level: Not on file  Occupational History  . Not on file  Tobacco Use  . Smoking status: Never Smoker  . Smokeless tobacco: Never Used  Substance and Sexual Activity  . Alcohol use: No  . Drug use: No  . Sexual activity: Not Currently  Other Topics Concern  . Not on file  Social History Narrative   2 sons, 4 pregnancies    Brother is [name]    Never smoker exposed 2nd hand    No guns    Wears seat belt    Safe in relationship, widowed lives alone   2 year college ed    Social Determinants of Health   Financial Resource Strain: Low Risk   . Difficulty of Paying Living Expenses: Not hard at all  Food Insecurity: No Food Insecurity  . Worried About Charity fundraiser in the Last Year: Never true  . Ran Out of Food in the Last Year: Never true  Transportation Needs: No Transportation Needs  . Lack of Transportation (Medical): No  . Lack of Transportation (Non-Medical): No  Physical Activity:   . Days of Exercise per Week: Not  on file  . Minutes of Exercise per Session: Not on file  Stress: No Stress Concern Present  . Feeling of Stress : Not at all  Social Connections: Unknown  . Frequency of Communication with Friends and Family: More than three times a week  . Frequency of Social Gatherings with Friends and Family: Not on file  . Attends Religious Services: Not on file  . Active Member of Clubs or Organizations: Not on file  . Attends Archivist Meetings: Not on file  . Marital Status: Not on file  Intimate Partner Violence:   . Fear of Current or Ex-Partner: Not on file  . Emotionally Abused: Not on file  . Physically Abused: Not on file  . Sexually Abused: Not on file   Current Meds  Medication Sig  . albuterol (PROVENTIL HFA;VENTOLIN HFA) 108 (90 Base) MCG/ACT inhaler Inhale into the lungs.   Marland Kitchen aspirin EC 81 MG tablet Take 81 mg by mouth daily.  . Azilsartan-Chlorthalidone 40-12.5 MG TABS Take 1 tablet by mouth every morning. For blood  . budesonide-formoterol (SYMBICORT) 160-4.5 MCG/ACT inhaler Inhale 2 puffs into the lungs 2 (two) times daily. Rinse mouth for asthma  . Evolocumab (REPATHA) 140 MG/ML SOSY Inject 140 mg into the skin every 14 (fourteen) days.  Marland Kitchen glucose blood (PRECISION QID TEST) test strip Check sugars 1-2 times daily. 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 Insulin Pen Needle (PEN NEEDLES) 30G X 8 MM MISC 1 Device by Does not apply route every 14 (fourteen) days. For repatha or preferred pen needle dispense  . ipratropium (ATROVENT) 0.06 % nasal spray Place 2 sprays into the nose 4 (four) times daily.  Marland Kitchen loratadine (CLARITIN) 10 MG tablet Take 10 mg by mouth daily as needed for allergies.  Marland Kitchen LORazepam (ATIVAN) 0.5 MG tablet Take 0.5-1 tablets (0.25-0.5 mg total) by mouth daily as needed.  . Multiple Vitamins-Minerals (MULTIVITAMIN WOMENS 50+ ADV PO) Take by mouth.  Glory Rosebush DELICA LANCETS FINE MISC USE TO CHECK BLOOD SUGAR 1-2 TIMES DAILY  . Polyethyl Glycol-Propyl Glycol (SYSTANE  FREE OP) Apply 1 drop to eye daily.  . sitaGLIPtin (JANUVIA) 25 MG tablet Take 1 tablet (25 mg total) by mouth daily.  Marland Kitchen VITAMIN D PO Take 5,000 Units by mouth daily.   . [DISCONTINUED]  nebivolol (BYSTOLIC) 2.5 MG tablet Take 1 tablet (2.5 mg total) by mouth daily. For blood pressure <130/<80 if elevated can take 2.5 if needed total up to 7.5 mg daily max  . [DISCONTINUED] nebivolol (BYSTOLIC) 5 MG tablet Take 1 tablet (5 mg total) by mouth at bedtime. For blood pressure <130/<80 if elevated can take 2.5 if needed total up to 7.5 mg daily max   Allergies  Allergen Reactions  . Adhesive  [Tape] Hives    Tape glue  . Alendronate Other (See Comments)    Stabbing pain in breast  . Alprazolam Other (See Comments)    Made anxious  . Amlodipine Other (See Comments)    Hives, [place] out, Black spots on face, Did not keep blood pressure down.   . Atorvastatin Other (See Comments)  . Benzalkonium Chloride Other (See Comments)    Made skin blister   . Clindamycin Other (See Comments)    Pt states that pill that is blue and red she cannot take due feeling like she was on edge  . Clonazepam Other (See Comments)    Headache   . Clonidine Other (See Comments)    Hurt chest    . Escitalopram Oxalate Other (See Comments)    Made anxious    . Gabapentin Other (See Comments)    A bad headache    . Hydrocodone-Acetaminophen Other (See Comments)    Made pain worse  . Iodinated Diagnostic Agents Swelling  . Iodine Other (See Comments)    Allergic to shell fish and x-ray dye   . Labetalol Other (See Comments)    Felt like veins are on fire   . Levofloxacin Other (See Comments)    Made me shake  . Lisinopril-Hydrochlorothiazide Other (See Comments)    Was in a blue and pale color   . Livalo [Pitavastatin]     Rash and nausea   . Lorazepam Other (See Comments)    Formula changed   . Losartan Potassium Other (See Comments)    Blood pressure    . Losartan Potassium-Hctz Other (See Comments)     Could not feel arms  . Meloxicam Other (See Comments)    Fatal blood pressure problems will cause strokes   . Metformin Nausea And Vomiting  . Metoclopramide Other (See Comments)    No air was moving, anxious    . Oxcarbazepine Other (See Comments)    Felt like she was chocking    . Penicillins   . Plavix [Clopidogrel]     Arms and legs like going to break in 1/2 per pt   . Potassium Citrate   . Pravastatin Other (See Comments)  . Rosuvastatin Other (See Comments)  . Shellfish Allergy Swelling  . Simvastatin Other (See Comments)  . Sitagliptin Other (See Comments)    "Made toes feel weird"  . Sodium Citrate Other (See Comments)    Teeth broke off   . Spironolactone     Breast pain    . Sulfa Antibiotics Other (See Comments) and Itching  . Zetia [Ezetimibe]     Muscle aches    Recent Results (from the past 2160 hour(s))  SAR CoV2 Serology (COVID 19)AB(IGG)IA     Status: None   Collection Time: 06/09/20  9:54 AM  Result Value Ref Range   SARS-CoV-2 Semi-Quant IgG Ab 462.0 Neg <13.0 AU/mL   SARS-CoV-2 Spike Ab Interp Positive     Comment: Antibodies against the SARS-CoV-2 spike protein, including the receptor binding domain (RBD) were detected.  It is not yet known what level of antibody to SARS-CoV-2 spike protein correlates to immunity against developing symptomatic SARS-CoV-2 disease. This assay was performed using [employer] Liaison(R) SARS-CoV-2 Trimeric S IgG assay.   Sodium, urine, random     Status: None   Collection Time: 06/09/20  9:54 AM  Result Value Ref Range   Sodium, Ur 31 28 - 272 mmol/L  Microalbumin / creatinine urine ratio     Status: None   Collection Time: 06/09/20  9:54 AM  Result Value Ref Range   Creatinine, Urine 49 20 - 275 mg/dL   Microalb, Ur 0.8 mg/dL    Comment: Reference Range Not established    Microalb Creat Ratio 16 <30 mcg/mg creat    Comment: . The ADA defines abnormalities in albumin excretion as follows: Marland Kitchen Albuminuria  Category        Result (mcg/mg creatinine) . Normal to Mildly increased   <30 Moderately increased         30-299  Severely increased           > OR = 300 . The ADA recommends that at least two of three specimens collected within a 3-6 month period be abnormal before considering a patient to be within a diagnostic category.   CBC with Differential/Platelet     Status: None   Collection Time: 06/09/20  9:54 AM  Result Value Ref Range   WBC 4.4 4.0 - 10.5 K/uL   RBC 4.97 3.87 - 5.11 Mil/uL   Hemoglobin 14.7 12.0 - 15.0 g/dL   HCT 43.3 36 - 46 %   MCV 87.1 78.0 - 100.0 fl   MCHC 33.9 30.0 - 36.0 g/dL   RDW 12.9 11.5 - 15.5 %   Platelets 234.0 150 - 400 K/uL   Neutrophils Relative % 57.5 43 - 77 %   Lymphocytes Relative 28.1 12 - 46 %   Monocytes Relative 11.4 3 - 12 %   Eosinophils Relative 2.1 0 - 5 %   Basophils Relative 0.9 0 - 3 %   Neutro Abs 2.5 1.4 - 7.7 K/uL   Lymphs Abs 1.2 0.7 - 4.0 K/uL   Monocytes Absolute 0.5 0.1 - 1.0 K/uL   Eosinophils Absolute 0.1 0.0 - 0.7 K/uL   Basophils Absolute 0.0 0.0 - 0.1 K/uL  Comprehensive metabolic panel     Status: Abnormal   Collection Time: 06/09/20  9:54 AM  Result Value Ref Range   Sodium 134 (L) 135 - 145 mEq/L   Potassium 4.2 3.5 - 5.1 mEq/L   Chloride 97 96 - 112 mEq/L   CO2 31 19 - 32 mEq/L   Glucose, Bld 105 (H) 70 - 99 mg/dL   BUN 8 6 - 23 mg/dL   Creatinine, Ser 0.96 0.40 - 1.20 mg/dL   Total Bilirubin 0.6 0.2 - 1.2 mg/dL   Alkaline Phosphatase 68 39 - 117 U/L   AST 18 0 - 37 U/L   ALT 12 0 - 35 U/L   Total Protein 6.9 6.0 - 8.3 g/dL   Albumin 4.0 3.5 - 5.2 g/dL   GFR 58.73 (L) >60.00 mL/min    Comment: Calculated using the CKD-EPI Creatinine Equation (2021)   Calcium 9.8 8.4 - 10.5 mg/dL  TSH     Status: None   Collection Time: 06/11/20  7:19 AM  Result Value Ref Range   TSH 1.27 0.35 - 4.50 uIU/mL  Hemoglobin A1c     Status: None   Collection Time: 06/11/20  7:19 AM  Result Value Ref Range   Hgb A1c MFr  Bld 6.3 4.6 - 6.5 %    Comment: Glycemic Control Guidelines for People with Diabetes:Non Diabetic:  <6%Goal of Therapy: <7%Additional Action Suggested:  >8%   Lipid panel     Status: None   Collection Time: 06/11/20  7:19 AM  Result Value Ref Range   Cholesterol 162 0 - 200 mg/dL    Comment: ATP III Classification       Desirable:  < 200 mg/dL               Borderline High:  200 - 239 mg/dL          High:  > = 240 mg/dL   Triglycerides 84.0 0 - 149 mg/dL    Comment: Normal:  <150 mg/dLBorderline High:  150 - 199 mg/dL   HDL 63.00 >39.00 mg/dL   VLDL 16.8 0.0 - 40.0 mg/dL   LDL Cholesterol 82 0 - 99 mg/dL   Total CHOL/HDL Ratio 3     Comment:                Men          Women1/2 Average Risk     3.4          3.3Average Risk          5.0          4.42X Average Risk          9.6          7.13X Average Risk          15.0          11.0                       NonHDL 99.02     Comment: NOTE:  Non-HDL goal should be 30 mg/dL higher than patient's LDL goal (i.e. LDL goal of < 70 mg/dL, would have non-HDL goal of < 100 mg/dL)   Objective  Body mass index is 31.67 kg/m. Wt Readings from Last 3 Encounters:  06/10/20 184 lb 9.6 oz (83.7 kg)  04/30/20 179 lb (81.2 kg)  02/06/20 189 lb (85.7 kg)   Temp Readings from Last 3 Encounters:  06/10/20 98.4 F (36.9 C)  02/06/20 98.6 F (37 C) (Oral)  05/22/19 98 F (36.7 C) (Skin)   BP Readings from Last 3 Encounters:  06/10/20 (!) 147/83  02/06/20 132/82  10/17/19 (!) 148/84   Pulse Readings from Last 3 Encounters:  06/10/20 70  02/06/20 63  10/17/19 66    Physical Exam Vitals and nursing note reviewed.  Constitutional:      Appearance: Normal appearance. She is well-developed and well-groomed. She is obese.  HENT:     Head: Normocephalic and atraumatic.  Eyes:     Conjunctiva/sclera: Conjunctivae normal.     Pupils: Pupils are equal, round, and reactive to light.  Cardiovascular:     Rate and Rhythm: Normal rate and regular rhythm.      Heart sounds: Normal heart sounds. No murmur heard.   Pulmonary:     Effort: Pulmonary effort is normal.     Breath sounds: Normal breath sounds.  Skin:    General: Skin is warm and dry.  Neurological:     General: No focal deficit present.     Mental Status: She is alert and oriented to person, place, and time. Mental status is at baseline.  Gait: Gait normal.     Comments: Cn 2-12 grossly intact   Psychiatric:        Attention and Perception: Attention and perception normal.        Mood and Affect: Mood and affect normal.        Speech: Speech normal.        Behavior: Behavior normal. Behavior is cooperative.        Thought Content: Thought content normal.        Cognition and Memory: Cognition and memory normal.        Judgment: Judgment normal.     Assessment  Plan  Essential hypertension - Plan: hydrALAZINE (APRESOLINE) 50 MG tablet tid  Added coreg 12.5 mg bid pt called back 06/15/20 BP still elevated increase to 25 mg bid  Cont azilsartan-chlorthalidone 40-12.5 1 pill qd  D/c bystolic 5 mg qd per pt unable to tolerate   Lipid panel, VAS US RENAL ARTERY DUPLEX  Hyperlipidemia, unspecified hyperlipidemia type - Plan: Lipid panel repatha is helping   Obesity, diabetes, and hypertension syndrome ([place]) - Plan: Hemoglobin A1c See about HTN management   Anxiety Prn Ativan   Thyroid nodule/goiter - Plan: TSH, US THYROID, Ambulatory referral to Endocrinology Dr. Buddy Duty needs thyroid bx  Age-related cataract of both eyes, unspecified age-related cataract type appt with [facility] Dr. Alden Hipp upcoming   HM Declines flu shot  utd prevnar and pna 23 RxTdap givenpreviously Declinesshingrix Pfizer 2/2 disc vaccine   LMP 07/1997 pap 12/09/14 neg pap neg HPV s/p hysterectomy no h/o abnormal pap 1 ovary intact ? Which one  Mammogramfat necrosis right breast 11/14/19and 10/09/18 normal Repeat 05/07/20 negative GI breast center   Colonoscopy 05/18/16  tubular adenoma Dr. Jerene Pitch [facility] f/u in 5 years and FH colon cancer in dad  DEXA 05/31/16 osteopenia consider repeat in 3-5 years vit D normal85.5 07/07/17  05/07/20 osteopenia rec calcium and vitamin D  Hep C neg 07/07/17  Never smoker h/o 2nd hand exposure  US thyroid nodules/MNG 06/14/18 ordered repeat due to abnormla   rec healthy diet and exercise   Summary: Renal:   Right: 1-59% stenosis of the right renal artery. RRV flow present. Left:  No evidence of left renal artery stenosis. LRV flow present.   *See table(s) above for measurements and observations.   Diagnosing physician: Monica Martinez MD   Electronically signed by Monica Martinez MD on 06/22/2020 at 4:43:41 PM. Provider: Dr. Olivia Mackie McLean[facility]

## 2020-06-11 ENCOUNTER — Other Ambulatory Visit (INDEPENDENT_AMBULATORY_CARE_PROVIDER_SITE_OTHER): Payer: Medicare Other

## 2020-06-11 DIAGNOSIS — E041 Nontoxic single thyroid nodule: Secondary | ICD-10-CM

## 2020-06-11 DIAGNOSIS — I10 Essential (primary) hypertension: Secondary | ICD-10-CM

## 2020-06-11 DIAGNOSIS — E785 Hyperlipidemia, unspecified: Secondary | ICD-10-CM

## 2020-06-11 DIAGNOSIS — R7303 Prediabetes: Secondary | ICD-10-CM | POA: Diagnosis not present

## 2020-06-11 LAB — LIPID PANEL
Cholesterol: 162 mg/dL (ref 0–200)
HDL: 63 mg/dL (ref >39.00)
LDL Cholesterol: 82 mg/dL (ref 0–99)
NonHDL: 99.02
Total CHOL/HDL Ratio: 3
Triglycerides: 84 mg/dL (ref 0.0–149.0)
VLDL: 16.8 mg/dL (ref 0.0–40.0)

## 2020-06-11 LAB — HEMOGLOBIN A1C: Hgb A1c MFr Bld: 6.3 % (ref 4.6–6.5)

## 2020-06-11 LAB — TSH: TSH: 1.27 u[IU]/mL (ref 0.35–4.50)

## 2020-06-13 DIAGNOSIS — M4802 Spinal stenosis, cervical region: Secondary | ICD-10-CM | POA: Diagnosis not present

## 2020-06-13 DIAGNOSIS — R2689 Other abnormalities of gait and mobility: Secondary | ICD-10-CM | POA: Diagnosis not present

## 2020-06-14 ENCOUNTER — Ambulatory Visit
Admission: RE | Admit: 2020-06-14 | Discharge: 2020-06-14 | Disposition: A | Discharge status: Home / Self Care | Payer: Medicare Other | Source: Ambulatory Visit | Attending: Sports Medicine | Admitting: Sports Medicine

## 2020-06-14 DIAGNOSIS — M19072 Primary osteoarthritis, left ankle and foot: Secondary | ICD-10-CM | POA: Diagnosis not present

## 2020-06-14 DIAGNOSIS — S93402D Sprain of unspecified ligament of left ankle, subsequent encounter: Secondary | ICD-10-CM

## 2020-06-14 DIAGNOSIS — R600 Localized edema: Secondary | ICD-10-CM | POA: Diagnosis not present

## 2020-06-14 DIAGNOSIS — M25472 Effusion, left ankle: Secondary | ICD-10-CM

## 2020-06-14 DIAGNOSIS — M25572 Pain in left ankle and joints of left foot: Secondary | ICD-10-CM

## 2020-06-14 DIAGNOSIS — M779 Enthesopathy, unspecified: Secondary | ICD-10-CM

## 2020-06-15 ENCOUNTER — Other Ambulatory Visit: Payer: Self-pay | Admitting: Internal Medicine

## 2020-06-15 ENCOUNTER — Telehealth: Payer: Self-pay | Admitting: Internal Medicine

## 2020-06-15 DIAGNOSIS — I10 Essential (primary) hypertension: Secondary | ICD-10-CM

## 2020-06-15 MED ORDER — CARVEDILOL 25 MG PO TABS: 25.0000 mg | ORAL_TABLET | Freq: Two times a day (BID) | ORAL | 3 refills | Status: DC

## 2020-06-15 NOTE — Telephone Encounter (Signed)
Patient was returning call for results 

## 2020-06-15 NOTE — Telephone Encounter (Signed)
Patient informed and verbalized understanding

## 2020-06-16 ENCOUNTER — Telehealth: Payer: Self-pay | Admitting: Internal Medicine

## 2020-06-16 DIAGNOSIS — I152 Hypertension secondary to endocrine disorders: Secondary | ICD-10-CM | POA: Insufficient documentation

## 2020-06-16 DIAGNOSIS — E669 Obesity, unspecified: Secondary | ICD-10-CM | POA: Insufficient documentation

## 2020-06-16 DIAGNOSIS — E049 Nontoxic goiter, unspecified: Secondary | ICD-10-CM | POA: Insufficient documentation

## 2020-06-16 DIAGNOSIS — E1159 Type 2 diabetes mellitus with other circulatory complications: Secondary | ICD-10-CM | POA: Insufficient documentation

## 2020-06-16 DIAGNOSIS — E119 Type 2 diabetes mellitus without complications: Secondary | ICD-10-CM | POA: Insufficient documentation

## 2020-06-16 NOTE — Telephone Encounter (Signed)
Patient informed and verbalized understanding.  Note faxed

## 2020-06-16 NOTE — Telephone Encounter (Signed)
Pt would like her lab results mailed to her. Please and Thank you!

## 2020-06-16 NOTE — Telephone Encounter (Signed)
Please inform pt additional labs back  Thyroid normal  Cholesterol improved on repatha great work  A1C 6.3 stable 06/11/20   Also fax this note to Dr. Kassie [facility] pt has upcoming appt please do DM eye exam A1C 6.3 06/11/20 and fax back to me [phone]

## 2020-06-16 NOTE — Telephone Encounter (Signed)
Mailed and Patient informed

## 2020-06-18 ENCOUNTER — Other Ambulatory Visit: Payer: Self-pay

## 2020-06-18 ENCOUNTER — Ambulatory Visit (INDEPENDENT_AMBULATORY_CARE_PROVIDER_SITE_OTHER): Payer: Medicare Other | Admitting: Sports Medicine

## 2020-06-18 ENCOUNTER — Encounter: Payer: Self-pay | Admitting: Sports Medicine

## 2020-06-18 DIAGNOSIS — M25572 Pain in left ankle and joints of left foot: Secondary | ICD-10-CM

## 2020-06-18 DIAGNOSIS — M779 Enthesopathy, unspecified: Secondary | ICD-10-CM

## 2020-06-18 DIAGNOSIS — M19079 Primary osteoarthritis, unspecified ankle and foot: Secondary | ICD-10-CM

## 2020-06-18 DIAGNOSIS — S93402D Sprain of unspecified ligament of left ankle, subsequent encounter: Secondary | ICD-10-CM

## 2020-06-18 DIAGNOSIS — M25472 Effusion, left ankle: Secondary | ICD-10-CM

## 2020-06-18 DIAGNOSIS — M79672 Pain in left foot: Secondary | ICD-10-CM | POA: Diagnosis not present

## 2020-06-18 NOTE — Progress Notes (Signed)
Subjective: Eliane Hammersmith is a 74 y.o. female patient who returns to office for follow up evaluation of left ankle pain. Reports pain is the same some days better than others, does not have on boot at this time. Patient is also here for review of MRI results.  Patient Active Problem List   Diagnosis Date Noted  . Obesity, diabetes, and hypertension syndrome ([place]) 06/16/2020  . Goiter 06/16/2020  . Age-related cataract of both eyes 06/10/2020  . Hypertension associated with diabetes ([place]) 02/06/2020  . Vision loss, right eye 02/06/2020  . Myopathy, unspecified 02/06/2020  . Chronic midline low back pain without sciatica 10/17/2019  . S/P laparoscopic cholecystectomy 09/24/2019  . Cataract 09/05/2019  . Insomnia 09/05/2019  . Atrial tachycardia ([place]) 09/05/2019  . Anxiety 09/05/2019  . 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 Combined forms of age-related cataract of both eyes 07/12/2019  . Carotid artery stenosis 06/23/2019  . MRI of brain abnormal 06/23/2019  . Gallstones 05/22/2019  . Thyroid nodule 05/22/2019  . Leg edema 02/13/2019  . Cervicalgia 10/04/2018  . Mid back pain 10/04/2018  . Toe pain, left 06/07/2018  . Arthritis 06/07/2018  . Type 2 diabetes mellitus without complication, without long-term current use of insulin ([place]) 06/07/2018  . Multinodular goiter 06/07/2018  . Essential hypertension 06/07/2018  . Hyperlipidemia 06/07/2018  . Chest pain 06/07/2018  . Mild intermittent asthma 06/07/2018  . Local edema 02/02/2018  . Skin infection 10/06/2016  . Osteopenia of multiple sites 10/05/2016  . Cervical radiculopathy 09/10/2016  . Barrett's esophagus with dysplasia 04/23/2016  . Chronic constipation 09/28/2015  . COPD (chronic obstructive pulmonary disease) ([place]) 09/28/2015  . GERD (gastroesophageal reflux disease) 09/28/2015  . History of stroke 09/28/2015  . Migraine 09/28/2015  . Obesity 09/28/2015  . Small vessel disease ([place])  09/28/2015  . Vasomotor rhinitis 09/28/2015    Current Outpatient Medications on File Prior to Visit  Medication Sig Dispense Refill  . albuterol (PROVENTIL HFA;VENTOLIN HFA) 108 (90 Base) MCG/ACT inhaler Inhale into the lungs.     Marland Kitchen aspirin EC 81 MG tablet Take 81 mg by mouth daily.    . Azilsartan-Chlorthalidone 40-12.5 MG TABS Take 1 tablet by mouth every morning. For blood 90 tablet 3  . budesonide-formoterol (SYMBICORT) 160-4.5 MCG/ACT inhaler Inhale 2 puffs into the lungs 2 (two) times daily. Rinse mouth for asthma 1 Inhaler 11  . carvedilol (COREG) 25 MG tablet Take 1 tablet (25 mg total) by mouth 2 (two) times daily with a meal. Increase to 25 mg bid ( 2 pills 2x per day) if BP >130/80 after 1 week. D/c nebivolol 2.5 and 5 mg 180 tablet 3  . Evolocumab (REPATHA) 140 MG/ML SOSY Inject 140 mg into the skin every 14 (fourteen) days. 2.1 mL 5  . glucose blood (PRECISION QID TEST) test strip Check sugars 1-2 times daily. E11.9 Non-insulin dependent    . hydrALAZINE (APRESOLINE) 50 MG tablet Take 1 tablet (50 mg total) by mouth 3 (three) times daily. Goal blood pressure <130/<80 for blood pressure 270 tablet 3  . Insulin Pen Needle (PEN NEEDLES) 30G X 8 MM MISC 1 Device by Does not apply route every 14 (fourteen) days. For repatha or preferred pen needle dispense 10 each 11  . ipratropium (ATROVENT) 0.06 % nasal spray Place 2 sprays into the nose 4 (four) times daily. 15 mL 12  . loratadine (CLARITIN) 10 MG tablet Take 10 mg by mouth daily as needed for allergies.    Marland Kitchen  LORazepam (ATIVAN) 0.5 MG tablet Take 0.5-1 tablets (0.25-0.5 mg total) by mouth daily as needed. 30 tablet 2  . Multiple Vitamins-Minerals (MULTIVITAMIN WOMENS 50+ ADV PO) Take by mouth.    Glory Rosebush DELICA LANCETS FINE MISC USE TO CHECK BLOOD SUGAR 1-2 TIMES DAILY    . Polyethyl Glycol-Propyl Glycol (SYSTANE FREE OP) Apply 1 drop to eye daily.    . sitaGLIPtin (JANUVIA) 25 MG tablet Take 1 tablet (25 mg total) by mouth daily.  90 tablet 3  . VITAMIN D PO Take 5,000 Units by mouth daily.      No current facility-administered medications on file prior to visit.    Allergies  Allergen Reactions  . Adhesive  [Tape] Hives    Tape glue  . Alendronate Other (See Comments)    Stabbing pain in breast  . Alprazolam Other (See Comments)    Made anxious  . Amlodipine Other (See Comments)    Hives, Golden Circle out, Black spots on face, Did not keep blood pressure down.   . Atorvastatin Other (See Comments)  . Benzalkonium Chloride Other (See Comments)    Made skin blister   . Clindamycin Other (See Comments)    Pt states that pill that is blue and red she cannot take due feeling like she was on edge  . Clonazepam Other (See Comments)    Headache   . Clonidine Other (See Comments)    Hurt chest    . Escitalopram Oxalate Other (See Comments)    Made anxious    . Gabapentin Other (See Comments)    A bad headache    . Hydrocodone-Acetaminophen Other (See Comments)    Made pain worse  . Iodinated Diagnostic Agents Swelling  . Iodine Other (See Comments)    Allergic to shell fish and x-ray dye   . Labetalol Other (See Comments)    Felt like veins are on fire   . Levofloxacin Other (See Comments)    Made me shake  . Lisinopril-Hydrochlorothiazide Other (See Comments)    Was in a blue and pale color   . Livalo [Pitavastatin]     Rash and nausea   . Lorazepam Other (See Comments)    Formula changed   . Losartan Potassium Other (See Comments)    Blood pressure    . Losartan Potassium-Hctz Other (See Comments)    Could not feel arms  . Meloxicam Other (See Comments)    Fatal blood pressure problems will cause strokes   . Metformin Nausea And Vomiting  . Metoclopramide Other (See Comments)    No air was moving, anxious    . Oxcarbazepine Other (See Comments)    Felt like she was chocking    . Penicillins   . Plavix [Clopidogrel]     Arms and legs like going to break in 1/2 per pt   . Potassium  Citrate   . Pravastatin Other (See Comments)  . Rosuvastatin Other (See Comments)  . Shellfish Allergy Swelling  . Simvastatin Other (See Comments)  . Sitagliptin Other (See Comments)    "Made toes feel weird"  . Sodium Citrate Other (See Comments)    Teeth broke off   . Spironolactone     Breast pain    . Sulfa Antibiotics Other (See Comments) and Itching  . Zetia [Ezetimibe]     Muscle aches     Objective:  General: Alert and oriented x3 in no acute distress Unchanged exam  Dermatology: No open lesions bilateral lower extremities, no webspace  macerations, no ecchymosis bilateral, all nails x 10 are well manicured.  Vascular: Dorsalis Pedis and Posterior Tibial pedal pulses palpable, Capillary Fill Time 3 seconds,(+) pedal hair growth bilateral, focal swelling left ankle, temperature gradient within normal limits.  Neurology: Johney Maine sensation intact via light touch bilateral.  Musculoskeletal: Mild tenderness with palpation at left lateral ankle and to posterior heel at Achilles insertion.  There is mild focal soft tissue swelling that is improving, strength within normal limits in all groups bilateral with mild guarding on left due to pain at the left ankle especially with plantarflexion and inversion of left foot.  Assessment and Plan: Problem List Items Addressed This Visit    None    Visit Diagnoses    Acute left ankle pain    -  Primary   Sprain of left ankle, unspecified ligament, subsequent encounter       Tendinitis       Ankle swelling, left       Pain in left ankle and joints of left foot       Left foot pain       Arthritis of foot          -Complete examination performed -Re-Discussed treatment options for ankle pain with arthritis as demonstrated on Xray -Advised patient to continue with CAM boot as needed -Continue with Anklet compression sleeve for patient to use during the day to assist with edema control like before -Recommend continue with rest ice  elevation avoid strenuous activity or excessive walking to avoid re-injury -Patient to return to office if fails to improve for injection or for consideration for custom brace.  Landis Martins, DPM

## 2020-06-22 ENCOUNTER — Ambulatory Visit (HOSPITAL_COMMUNITY)
Admission: RE | Admit: 2020-06-22 | Discharge: 2020-06-22 | Disposition: A | Discharge status: Home / Self Care | Payer: Medicare Other | Source: Ambulatory Visit | Attending: Internal Medicine | Admitting: Internal Medicine

## 2020-06-22 ENCOUNTER — Ambulatory Visit
Admission: RE | Admit: 2020-06-22 | Discharge: 2020-06-22 | Disposition: A | Discharge status: Home / Self Care | Payer: Medicare Other | Source: Ambulatory Visit | Attending: Internal Medicine | Admitting: Internal Medicine

## 2020-06-22 ENCOUNTER — Other Ambulatory Visit: Payer: Self-pay

## 2020-06-22 DIAGNOSIS — E041 Nontoxic single thyroid nodule: Secondary | ICD-10-CM | POA: Diagnosis not present

## 2020-06-22 DIAGNOSIS — I10 Essential (primary) hypertension: Secondary | ICD-10-CM | POA: Diagnosis not present

## 2020-06-22 NOTE — Progress Notes (Signed)
Renal artery duplex completed. Refer to "CV Proc" under chart review to view preliminary results.  06/22/2020 9:31 AM Kelby Aline., MHA, RVT, RDCS, RDMS

## 2020-06-23 ENCOUNTER — Telehealth: Payer: Self-pay

## 2020-06-23 DIAGNOSIS — I701 Atherosclerosis of renal artery: Secondary | ICD-10-CM | POA: Insufficient documentation

## 2020-06-23 NOTE — Telephone Encounter (Signed)
Increase hydralazine 100 tid, taper down on coreg lower dose 3.125 mg bid ?  Rec she call cardiology for appt asap   Pt chose the above. She would also like tio go to a cardiologist in [place].

## 2020-06-23 NOTE — Addendum Note (Signed)
Addended by: Orland Mustard on: 06/23/2020 03:38 PM   Modules accepted: Orders

## 2020-06-23 NOTE — Telephone Encounter (Signed)
Increase hydralazine 100 tid, taper down on coreg lower dose 3.125 mg bid ?  Rec she call cardiology for appt asap  OR  Does want to change from coreg to metoprolol at night only ?

## 2020-06-23 NOTE — Telephone Encounter (Signed)
Patient stated she can not handle the 25mg  of carvedilol she stated she can barley tolerate the 12.5 mg BID. She says her body cant take that much.

## 2020-06-24 ENCOUNTER — Other Ambulatory Visit: Payer: Self-pay | Admitting: Internal Medicine

## 2020-06-24 DIAGNOSIS — I10 Essential (primary) hypertension: Secondary | ICD-10-CM

## 2020-06-24 MED ORDER — HYDRALAZINE HCL 100 MG PO TABS: 100.0000 mg | ORAL_TABLET | Freq: Three times a day (TID) | ORAL | 3 refills | Status: DC

## 2020-06-24 MED ORDER — CARVEDILOL 3.125 MG PO TABS: 3.1250 mg | ORAL_TABLET | Freq: Two times a day (BID) | ORAL | 3 refills | Status: DC

## 2020-06-24 NOTE — Telephone Encounter (Signed)
Referred Dr. Skeet Latch

## 2020-06-24 NOTE — Addendum Note (Signed)
Addended by: Orland Mustard on: 06/24/2020 07:37 AM   Modules accepted: Orders

## 2020-06-24 NOTE — Telephone Encounter (Signed)
Good morning!  Received and sent.

## 2020-07-06 ENCOUNTER — Telehealth: Payer: Self-pay | Admitting: Internal Medicine

## 2020-07-06 ENCOUNTER — Encounter: Payer: Self-pay | Admitting: Internal Medicine

## 2020-07-06 NOTE — Telephone Encounter (Signed)
Manaia Samad Dob >12/08/1945      You  Louretta Shorten, Artis 3 days ago    Good morning,   What is your sister's date of birth?       Louretta Shorten, Artis  McLean-Scocuzza, Nino Glow, MD 3 days ago    Dr.Gidget Quizhpi, My Sister Ms Nadene Witherspoon, is taking blood pressure medication that way to strong for her she's been trying to contact you She use to take AmLODpine Tab, but she told me [facility]  Stop [facility] it due to Manufacturers stop making it I told her she need to Start using the Mail order [facility] please call her and order all her med though them [facility] stop using [facility]     SEE above brother sent my chart re sisters BP meds   What has her BP been?   Also we have norvasc on her allergy list review this with patient again?   She should be on  Coreg 3.125 mg 2x per day Hydralazine 100 mg 3x per day Azilsartan-chlorthalidone 40-12.5 mg daily    Has cardiology appt been made Dr. Skeet Latch? She also can help with BP control

## 2020-07-06 NOTE — Telephone Encounter (Signed)
Patient states that her BP has still been running high with ongoing headaches. She could not give me any exact numbers foe her BP.   States she is taking Coreg once daily as taking this twice daily caused her increased headaches and nausea. States this still happens but not as bad on one.   Patient is taking 50 mg TID of hydralazine as she states 100 mg made her headaches worse as well.   Patient is taking the combo pill once daily, she states she has no issues with this.   Patient states she was fine on the amlodipine until they changed the manufacturer to generic. Wants to know if she can try brand name Amlodipine.   Gave the Patient the number and address for Dr Blenda Mounts office as they have not called the Patient yet.

## 2020-07-06 NOTE — Telephone Encounter (Signed)
Marissa Jarred  King, Marissa Glow, MD 3 days ago     Marissa King  Dob > 12/08/1945       You  Marissa King, Marissa King 3 days ago     Good morning,    What is your sister's date of birth?        Marissa King, Marissa King  King, Marissa Glow, MD 3 days ago     Dr.Tracy, My Sister  Marissa King,  is taking blood pressure medication that way to strong for her she's been trying to contact you She use to take AmLODpine Tab, but she told me [facility]  Stop carrying it due to Manufacturers stop making it I told her she need to Start using the Mail order  [facility]   please call her and order all her med though them  [facility]

## 2020-07-06 NOTE — Telephone Encounter (Signed)
Zenovia Jarred  McLean-Scocuzza, Nino Glow, MD 3 days ago    Lylianna Fraiser  Dob > 12/08/1945   You  Marissa King, Marissa King 3 days ago   Good morning,   What is your sister's date of birth?    Marissa King, Marissa King  McLean-Scocuzza, Nino Glow, MD 3 days ago   Dr.Tracy, My Sister  Ms Noreta Kue,  is taking blood pressure medication that way to strong for her she's been trying to contact you She use to take AmLODpine Tab, but she told me [facility]  Stop carrying it due to Manufacturers stop making it I told her she need to Start using the Mail order  [facility]   please call her and order all her med though them  [facility] using  [facility]

## 2020-07-07 ENCOUNTER — Ambulatory Visit (INDEPENDENT_AMBULATORY_CARE_PROVIDER_SITE_OTHER): Payer: Medicare Other | Admitting: Vascular Surgery

## 2020-07-07 ENCOUNTER — Encounter: Payer: Self-pay | Admitting: Vascular Surgery

## 2020-07-07 ENCOUNTER — Other Ambulatory Visit: Payer: Self-pay

## 2020-07-07 VITALS — BP 157/102 | HR 74 | Temp 97.5°F | Resp 16 | Ht 64.0 in | Wt 181.0 lb

## 2020-07-07 DIAGNOSIS — I701 Atherosclerosis of renal artery: Secondary | ICD-10-CM | POA: Diagnosis not present

## 2020-07-07 NOTE — Progress Notes (Signed)
Patient name: Marissa King MRN: [id number] DOB: July 24, 1946 Sex: female  REASON FOR CONSULT: Evaluate right renal artery stenosis  HPI: Marissa King is a 74 y.o. female, with history of hypertension, hyperlipidemia, diabetes that presents for evaluation of possible right renal artery stenosis.  Patient states that ultrasound was obtained by her PCP Dr. Morrison Old due to some ongoing issues with her blood pressure.  States her blood pressure can easily be over 062 systolic.  She is now on 3 agents.  Blood pressure medicine includes hydralazine, Coreg, as well as azilsartan.  She does not have any issues with her renal function to her knowledge.  No previous renal artery interventions.  Does not smoke.  Past Medical History:  Diagnosis Date  . Allergy   . 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 Asthma    with h/o chronic bronchitis  . Back pain    mid back pain h/ o trauma   . Diabetes mellitus without complication ([place])   . Frequent headaches   . Gastric ulcer   . GERD (gastroesophageal reflux disease)   . History of chicken pox   . Hyperlipidemia   . Hypertension   . Migraines   . Thyroid disease    mng     Past Surgical History:  Procedure Laterality Date  . ABDOMINAL HYSTERECTOMY     DUB 1 ovary intact   . CHOLECYSTECTOMY     09/02/19    Family History  Problem Relation Age of Onset  . Alcohol abuse Mother   . Heart disease Mother        CHF  . Hypertension Mother   . Hyperlipidemia Mother   . Stroke Mother   . Mental illness Mother   . Alcohol abuse Father   . Cancer Father        pancreatitic   . Arthritis Sister   . COPD Sister   . Depression Sister   . Drug abuse Sister   . Hearing loss Sister   . Heart disease Sister        CHF  . Hyperlipidemia Sister   . Hypertension Sister   . Mental illness Sister   . COPD Brother   . Depression Brother   . Heart disease Brother   . Hyperlipidemia Brother   . Stroke Brother   . Asthma Son   .  Arthritis Brother   . Depression Brother   . Heart disease Brother   . Hyperlipidemia Brother   . Mental illness Brother   . Mental illness Brother   . Hyperlipidemia Brother   . Drug abuse Brother   . Diabetes Brother   . Depression Brother   . COPD Brother   . Birth defects Brother   . Arthritis Brother     SOCIAL HISTORY: Social History   Socioeconomic History  . Marital status: Widowed    Spouse name: Not on file  . Number of children: Not on file  . Years of education: Not on file  . Highest education level: Not on file  Occupational History  . Not on file  Tobacco Use  . Smoking status: Never Smoker  . Smokeless tobacco: Never Used  Substance and Sexual Activity  . Alcohol use: No  . Drug use: No  . Sexual activity: Not Currently  Other Topics Concern  . Not on file  Social History Narrative   2 sons, 4 pregnancies    Brother is Control and instrumentation engineer  Never smoker exposed 2nd hand    No guns    Wears seat belt    Safe in relationship, widowed lives alone   2 year college ed    Social Determinants of Health   Financial Resource Strain: Low Risk   . Difficulty of Paying Living Expenses: Not hard at all  Food Insecurity: No Food Insecurity  . Worried About Charity fundraiser in the Last Year: Never true  . Ran Out of Food in the Last Year: Never true  Transportation Needs: No Transportation Needs  . Lack of Transportation (Medical): No  . Lack of Transportation (Non-Medical): No  Physical Activity:   . Days of Exercise per Week: Not on file  . Minutes of Exercise per Session: Not on file  Stress: No Stress Concern Present  . Feeling of Stress : Not at all  Social Connections: Unknown  . Frequency of Communication with Friends and Family: More than three times a week  . Frequency of Social Gatherings with Friends and Family: Not on file  . Attends Religious Services: Not on file  . Active Member of Clubs or Organizations: Not on file  . Attends Theatre manager Meetings: Not on file  . Marital Status: Not on file  Intimate Partner Violence:   . Fear of Current or Ex-Partner: Not on file  . Emotionally Abused: Not on file  . Physically Abused: Not on file  . Sexually Abused: Not on file    Allergies  Allergen Reactions  . Adhesive  [Tape] Hives    Tape glue  . Alendronate Other (See Comments)    Stabbing pain in breast  . Alprazolam Other (See Comments)    Made anxious  . Amlodipine Other (See Comments)    Hives, Golden Circle out, Black spots on face, Did not keep blood pressure down.   . Atorvastatin Other (See Comments)  . Benzalkonium Chloride Other (See Comments)    Made skin blister   . Clindamycin Other (See Comments)    Pt states that pill that is blue and red she cannot take due feeling like she was on edge  . Clonazepam Other (See Comments)    Headache   . Clonidine Other (See Comments)    Hurt chest    . Escitalopram Oxalate Other (See Comments)    Made anxious    . Gabapentin Other (See Comments)    A bad headache    . Hydrocodone-Acetaminophen Other (See Comments)    Made pain worse  . Iodinated Diagnostic Agents Swelling  . Iodine Other (See Comments)    Allergic to shell fish and x-ray dye   . Labetalol Other (See Comments)    Felt like veins are on fire   . Levofloxacin Other (See Comments)    Made me shake  . Lisinopril-Hydrochlorothiazide Other (See Comments)    Was in a blue and pale color   . Livalo [Pitavastatin]     Rash and nausea   . Lorazepam Other (See Comments)    Formula changed   . Losartan Potassium Other (See Comments)    Blood pressure    . Losartan Potassium-Hctz Other (See Comments)    Could not feel arms  . Meloxicam Other (See Comments)    Fatal blood pressure problems will cause strokes   . Metformin Nausea And Vomiting  . Metoclopramide Other (See Comments)    No air was moving, anxious    . Oxcarbazepine Other (See Comments)    Felt like  she was chocking    .  Penicillins   . Plavix [Clopidogrel]     Arms and legs like going to break in 1/2 per pt   . Potassium Citrate   . Pravastatin Other (See Comments)  . Rosuvastatin Other (See Comments)  . Shellfish Allergy Swelling  . Simvastatin Other (See Comments)  . Sitagliptin Other (See Comments)    "Made toes feel weird"  . Sodium Citrate Other (See Comments)    Teeth broke off   . Spironolactone     Breast pain    . Sulfa Antibiotics Other (See Comments) and Itching  . Zetia [Ezetimibe]     Muscle aches     Current Outpatient Medications  Medication Sig Dispense Refill  . albuterol (PROVENTIL HFA;VENTOLIN HFA) 108 (90 Base) MCG/ACT inhaler Inhale into the lungs.     Marland Kitchen aspirin EC 81 MG tablet Take 81 mg by mouth daily.    . Azilsartan-Chlorthalidone 40-12.5 MG TABS Take 1 tablet by mouth every morning. For blood 90 tablet 3  . budesonide-formoterol (SYMBICORT) 160-4.5 MCG/ACT inhaler Inhale 2 puffs into the lungs 2 (two) times daily. Rinse mouth for asthma 1 Inhaler 11  . carvedilol (COREG) 3.125 MG tablet Take 1 tablet (3.125 mg total) by mouth 2 (two) times daily with a meal. Increase to 25 mg bid ( 2 pills 2x per day) if BP >130/80 after 1 week. D/c nebivolol 2.5 and 5 mg 180 tablet 3  . Evolocumab (REPATHA) 140 MG/ML SOSY Inject 140 mg into the skin every 14 (fourteen) days. 2.1 mL 5  . glucose blood (PRECISION QID TEST) test strip Check sugars 1-2 times daily. E11.9 Non-insulin dependent    . hydrALAZINE (APRESOLINE) 100 MG tablet Take 1 tablet (100 mg total) by mouth 3 (three) times daily. Goal blood pressure <130/<80 for blood pressure 270 tablet 3  . Insulin Pen Needle (PEN NEEDLES) 30G X 8 MM MISC 1 Device by Does not apply route every 14 (fourteen) days. For repatha or preferred pen needle dispense 10 each 11  . ipratropium (ATROVENT) 0.06 % nasal spray Place 2 sprays into the nose 4 (four) times daily. 15 mL 12  . loratadine (CLARITIN) 10 MG tablet Take 10 mg by mouth daily as  needed for allergies.    Marland Kitchen LORazepam (ATIVAN) 0.5 MG tablet Take 0.5-1 tablets (0.25-0.5 mg total) by mouth daily as needed. 30 tablet 2  . Multiple Vitamins-Minerals (MULTIVITAMIN WOMENS 50+ ADV PO) Take by mouth.    Glory Rosebush DELICA LANCETS FINE MISC USE TO CHECK BLOOD SUGAR 1-2 TIMES DAILY    . Polyethyl Glycol-Propyl Glycol (SYSTANE FREE OP) Apply 1 drop to eye daily.    . sitaGLIPtin (JANUVIA) 25 MG tablet Take 1 tablet (25 mg total) by mouth daily. 90 tablet 3  . VITAMIN D PO Take 5,000 Units by mouth daily.      No current facility-administered medications for this visit.    REVIEW OF SYSTEMS:  [X]  denotes positive finding, [ ]  denotes negative finding Cardiac  Comments:  Chest pain or chest pressure: x   Shortness of breath upon exertion: x   Short of breath when lying flat:    Irregular heart rhythm:        Vascular    Pain in calf, thigh, or hip brought on by ambulation:    Pain in feet at night that wakes you up from your sleep:     Blood clot in your veins:    Leg swelling:  x       Pulmonary    Oxygen at home:    Productive cough:     Wheezing:         Neurologic    Sudden weakness in arms or legs:     Sudden numbness in arms or legs:     Sudden onset of difficulty speaking or slurred speech:    Temporary loss of vision in one eye:     Problems with dizziness:  x       Gastrointestinal    Blood in stool:     Vomited blood:         Genitourinary    Burning when urinating:     Blood in urine:        Psychiatric    Major depression:         Hematologic    Bleeding problems:    Problems with blood clotting too easily:        Skin    Rashes or ulcers:        Constitutional    Fever or chills:      PHYSICAL EXAM: Vitals:   07/07/20 1051  BP: (!) 157/102  Pulse: 74  Resp: 16  Temp: (!) 97.5 F (36.4 C)  TempSrc: Temporal  SpO2: 98%  Weight: 181 lb (82.1 kg)  Height: 5\' 4"  (1.626 m)    GENERAL: The patient is a well-nourished female, in  no acute distress. The vital signs are documented above. CARDIAC: There is a regular rate and rhythm.  VASCULAR:  Palpable femoral pulses bilaterally Palpable dorsalis pedis pulses bilaterally PULMONARY: No respiratory distress. ABDOMEN: Soft and non-tender. MUSCULOSKELETAL: There are no major deformities or cyanosis. NEUROLOGIC: No focal weakness or paresthesias are detected. SKIN: There are no ulcers or rashes noted. PSYCHIATRIC: The patient has a normal affect.  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  I reviewed her ultrasound images and discussed with her in detail that a 1 to 59% stenosis is not considered critical and does not require intervention.  We typically intervene on renal artery stenosis greater than 60% and our lab criteria would be a velocity of greater than 250 with a ratio greater than 3.5.  She really does not even have borderline velocities and I do not think she needs any intervention on her renal arteries.  I provided her reassurance.  She is on aspirin as well as cholesterol medicine for overall risk reduction.  I don't think she needs scheduled follow-up with [place] and happy to see her in the future if any questions concerns arise.   Marty Heck, MD [facility] Office: [phone]

## 2020-07-08 NOTE — Progress Notes (Signed)
Cardiology Office Note:    Date:  07/10/2020   ID:  Marissa King, DOB [birth year], MRN [id number]  PCP:  McLean-Scocuzza, Nino Glow, MD  Cardiologist:  Sinclair Grooms, MD   Referring MD: McLean-Scocuzza, Marissa King *   Chief Complaint  Patient presents with  . Hypertension    History of Present Illness:    Marissa King is a 74 y.o. female with a hx of poorly controlled blood pressure and multiple medication intolerances. She is here for help with blood pressure control.  The patient has an incredibly long list of medication intolerances including amlodipine, nebivolol, carvedilol, losartan, lisinopril, labetalol, clonidine, and spironolactone (breast pain).  I examined a list of blood pressure recordings over the past several weeks and generally is running in the 150/90 mmHg range.  States she is compliant with as azilsartan/chlorthalidone 40 mg / 12.5 mg 1 daily, hydralazine 50 mg 3 times daily, and is yet to start amlodipine 2.5 mg daily.  She denies nonsteroidal anti-inflammatory therapy, alcohol use, but does have sleep apnea that is not treated with CPAP.  Past Medical History:  Diagnosis Date  . Allergy   . 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 Asthma    with h/o chronic bronchitis  . Back pain    mid back pain h/ o trauma   . Diabetes mellitus without complication ([place])   . Frequent headaches   . Gastric ulcer   . GERD (gastroesophageal reflux disease)   . History of chicken pox   . Hyperlipidemia   . Hypertension   . Migraines   . Thyroid disease    mng     Past Surgical History:  Procedure Laterality Date  . ABDOMINAL HYSTERECTOMY     DUB 1 ovary intact   . CHOLECYSTECTOMY     09/02/19    Current Medications: Current Meds  Medication Sig  . albuterol (PROVENTIL HFA;VENTOLIN HFA) 108 (90 Base) MCG/ACT inhaler Inhale into the lungs.   Marland Kitchen amLODipine (NORVASC) 2.5 MG tablet Take 1 tablet (2.5 mg total) by mouth daily.  Marland Kitchen aspirin EC 81 MG tablet  Take 81 mg by mouth daily.  . Azilsartan-Chlorthalidone 40-12.5 MG TABS Take 1 tablet by mouth every morning. For blood  . budesonide-formoterol (SYMBICORT) 160-4.5 MCG/ACT inhaler Inhale 2 puffs into the lungs 2 (two) times daily. Rinse mouth for asthma  . Evolocumab (REPATHA) 140 MG/ML SOSY Inject 140 mg into the skin every 14 (fourteen) days.  Marland Kitchen glucose blood test strip Check sugars 1-2 times daily. 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 hydrALAZINE (APRESOLINE) 100 MG tablet Take 0.5 tablets (50 mg total) by mouth 3 (three) times daily. Goal blood pressure <130/<80 for blood pressure  . Insulin Pen Needle (PEN NEEDLES) 30G X 8 MM MISC 1 Device by Does not apply route every 14 (fourteen) days. For repatha or preferred pen needle dispense  . ipratropium (ATROVENT) 0.06 % nasal spray Place 2 sprays into the nose 4 (four) times daily.  Marland Kitchen loratadine (CLARITIN) 10 MG tablet Take 1 tablet (10 mg total) by mouth daily as needed for allergies.  Marland Kitchen LORazepam (ATIVAN) 0.5 MG tablet Take 0.5-1 tablets (0.25-0.5 mg total) by mouth daily as needed.  . Multiple Vitamins-Minerals (MULTIVITAMIN WOMENS 50+ ADV PO) Take by mouth.  Glory Rosebush DELICA LANCETS FINE MISC USE TO CHECK BLOOD SUGAR 1-2 TIMES DAILY  . Polyethyl Glycol-Propyl Glycol (SYSTANE FREE OP) Apply 1 drop to eye daily.  . sitaGLIPtin (JANUVIA) 25 MG tablet  Take 1 tablet (25 mg total) by mouth daily.  Marland Kitchen VITAMIN D PO Take 5,000 Units by mouth daily.      Allergies:   Adhesive  [tape], Alendronate, Alprazolam, Amlodipine, Atorvastatin, Benzalkonium chloride, Clindamycin, Clonazepam, Clonidine, Escitalopram oxalate, Gabapentin, Hydrocodone-acetaminophen, Iodinated diagnostic agents, Iodine, Labetalol, Levofloxacin, Lisinopril-hydrochlorothiazide, Livalo [pitavastatin], Lorazepam, Losartan potassium, Losartan potassium-hctz, Meloxicam, Metformin, Metoclopramide, Oxcarbazepine, Penicillins, Plavix [clopidogrel], Potassium citrate, Pravastatin, Rosuvastatin,  Shellfish allergy, Simvastatin, Sitagliptin, Sodium citrate, Spironolactone, Sulfa antibiotics, and Zetia [ezetimibe]   Social History   Socioeconomic History  . Marital status: Widowed    Spouse name: Not on file  . Number of children: Not on file  . Years of education: Not on file  . Highest education level: Not on file  Occupational History  . Not on file  Tobacco Use  . Smoking status: Never Smoker  . Smokeless tobacco: Never Used  Substance and Sexual Activity  . Alcohol use: No  . Drug use: No  . Sexual activity: Not Currently  Other Topics Concern  . Not on file  Social History Narrative   2 sons, 4 pregnancies    Brother is [name]    Never smoker exposed 2nd hand    No guns    Wears seat belt    Safe in relationship, widowed lives alone   2 year college ed    Social Determinants of Health   Financial Resource Strain: Low Risk   . Difficulty of Paying Living Expenses: Not hard at all  Food Insecurity: No Food Insecurity  . Worried About Charity fundraiser in the Last Year: Never true  . Ran Out of Food in the Last Year: Never true  Transportation Needs: No Transportation Needs  . Lack of Transportation (Medical): No  . Lack of Transportation (Non-Medical): No  Physical Activity: Not on file  Stress: No Stress Concern Present  . Feeling of Stress : Not at all  Social Connections: Unknown  . Frequency of Communication with Friends and Family: More than three times a week  . Frequency of Social Gatherings with Friends and Family: Not on file  . Attends Religious Services: Not on file  . Active Member of Clubs or Organizations: Not on file  . Attends Archivist Meetings: Not on file  . Marital Status: Not on file     Family History: The patient's family history includes Alcohol abuse in her father and mother; Arthritis in her brother, brother, and sister; Asthma in her son; Birth defects in her brother; COPD in her brother, brother, and  sister; Cancer in her father; Depression in her brother, brother, brother, and sister; Diabetes in her brother; Drug abuse in her brother and sister; Hearing loss in her sister; Heart disease in her brother, brother, mother, and sister; Hyperlipidemia in her brother, brother, brother, mother, and sister; Hypertension in her mother and sister; Mental illness in her brother, brother, mother, and sister; Stroke in her brother and mother.  ROS:   Please see the history of present illness.    The patient is anxious.  She gives conflicting information about medication intolerances.  She has a very strong family history of hypertension that includes her mother and brother.  She is overweight, does not restrict sodium in her diet, and does not sleep well.  All other systems reviewed and are negative.  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  Poor R wave progression V1 through V4.  Recent Labs: 06/09/2020: ALT 12; BUN 8; Creatinine, Ser 0.96; Hemoglobin 14.7; Platelets 234.0; Potassium 4.2; Sodium 134 06/11/2020: TSH 1.27  Recent Lipid Panel    Component Value Date/Time   CHOL 162 06/11/2020 0719   TRIG 84.0 06/11/2020 0719   HDL 63.00 06/11/2020 0719   CHOLHDL 3 06/11/2020 0719   VLDL 16.8 06/11/2020 0719   LDLCALC 82 06/11/2020 0719    Physical Exam:    VS:  BP (!) 150/90   Pulse 76   Ht 5\' 4"  (1.626 m)   Wt 186 lb (84.4 kg)   SpO2 98%   BMI 31.93 kg/m     Wt Readings from Last 3 Encounters:  07/10/20 186 lb (84.4 kg)  07/09/20 185 lb 9.6 oz (84.2 kg)  07/07/20 181 lb (82.1 kg)     GEN: Moderate obesity with BMI greater than 30. No acute distress HEENT: Normal NECK: No JVD. LYMPHATICS: No lymphadenopathy CARDIAC:  RRR without murmur, gallop, or edema. VASCULAR:  Normal Pulses. No bruits. RESPIRATORY:  Clear to  auscultation without rales, wheezing or rhonchi  ABDOMEN: Soft, non-tender, non-distended, No pulsatile mass, MUSCULOSKELETAL: No deformity  SKIN: Warm and dry NEUROLOGIC:  Alert and oriented x 3 PSYCHIATRIC:  Normal affect   ASSESSMENT:    1. Primary hypertension   2. Diastolic dysfunction   3. Hyperlipidemia, unspecified hyperlipidemia type   4. Family history of stroke   5. Diabetes mellitus type 2 in obese (HCC)   6. Class 2 severe obesity due to excess calories with serious comorbidity and body mass index (BMI) of 35.0 to 35.9 in adult [facility])   7. Educated about COVID-19 virus infection    PLAN:    In order of problems listed above:  1. The patient has poorly controlled blood pressure, multiple intolerances, aggravating untreated predispositions (obstructive sleep apnea; no salt restricted diet attempts), obesity, anxiety, and stress.  She will add amlodipine through a new pharmacy that she is trying to use.  If her blood pressure does not improve after adding amlodipine to hydralazine and ARB/diuretic, we will add 25 mg/day of eplerenone to the mix.  We will not do this currently until the amlodipine has been added to see if we can get closer to target heart rate of 130/80 mmHg.  Higher treatment options are limited due to the exhaustive list of intolerances.  Our best bet will be to work on lifestyle changes including weight loss, diet, and compliance with CPAP.   She will call us when the amlodipine has been used with follow-up blood pressures.  We will engaged to prescribe eplerenone at that time if needed.  No follow-up appointment at this time but will see how she does after therapy is uptitrated by Dr. Algernon Huxley   Medication Adjustments/Labs and Tests Ordered: Current medicines are reviewed at length with the patient today.  Concerns regarding medicines are outlined above.  Orders Placed This Encounter  Procedures  . EKG 12-Lead   No orders of the defined types were placed  in this encounter.   Patient Instructions  Medication Instructions:  Your physician recommends that you continue on your current medications as directed. Please refer to the Current Medication list given to you today.  *If you need a refill on your cardiac medications before your next appointment, please call your pharmacy*   Lab Work: None If you have labs (blood work) drawn today and your tests  are completely normal, you will receive your results only by: Marland Kitchen MyChart Message (if you have MyChart) OR . A paper copy in the mail If you have any lab test that is abnormal or we need to change your treatment, we will call you to review the results.   Testing/Procedures: None   Follow-Up: At [facility], you and your health needs are our priority.  As part of our continuing mission to provide you with exceptional heart care, we have created designated Provider Care Teams.  These Care Teams include your primary Cardiologist (physician) and Advanced Practice Providers (APPs -  Physician Assistants and Nurse Practitioners) who all work together to provide you with the care you need, when you need it.  We recommend signing up for the patient portal called "MyChart".  Sign up information is provided on this After Visit Summary.  MyChart is used to connect with patients for Virtual Visits (Telemedicine).  Patients are able to view lab/test results, encounter notes, upcoming appointments, etc.  Non-urgent messages can be sent to your provider as well.   To learn more about what you can do with MyChart, go to [email or web address].    Your next appointment:   As needed  The format for your next appointment:   In Person  Provider:   You may see Sinclair Grooms, MD or one of the following Advanced Practice Providers on your designated Care Team:    Truitt Merle, NP  Cecilie Kicks, NP  Kathyrn Drown, NP    Other Instructions      Signed, Sinclair Grooms, MD  07/10/2020 3:40  PM    [place]

## 2020-07-09 ENCOUNTER — Ambulatory Visit (INDEPENDENT_AMBULATORY_CARE_PROVIDER_SITE_OTHER): Payer: Medicare Other | Admitting: Internal Medicine

## 2020-07-09 ENCOUNTER — Encounter: Payer: Self-pay | Admitting: Internal Medicine

## 2020-07-09 ENCOUNTER — Other Ambulatory Visit: Payer: Self-pay

## 2020-07-09 VITALS — BP 138/90 | HR 77 | Temp 97.8°F | Ht 64.0 in | Wt 185.6 lb

## 2020-07-09 DIAGNOSIS — I10 Essential (primary) hypertension: Secondary | ICD-10-CM

## 2020-07-09 DIAGNOSIS — I672 Cerebral atherosclerosis: Secondary | ICD-10-CM

## 2020-07-09 DIAGNOSIS — R519 Headache, unspecified: Secondary | ICD-10-CM | POA: Diagnosis not present

## 2020-07-09 DIAGNOSIS — H269 Unspecified cataract: Secondary | ICD-10-CM

## 2020-07-09 DIAGNOSIS — I701 Atherosclerosis of renal artery: Secondary | ICD-10-CM | POA: Diagnosis not present

## 2020-07-09 DIAGNOSIS — R269 Unspecified abnormalities of gait and mobility: Secondary | ICD-10-CM

## 2020-07-09 DIAGNOSIS — G8929 Other chronic pain: Secondary | ICD-10-CM

## 2020-07-09 DIAGNOSIS — R42 Dizziness and giddiness: Secondary | ICD-10-CM

## 2020-07-09 DIAGNOSIS — J309 Allergic rhinitis, unspecified: Secondary | ICD-10-CM | POA: Diagnosis not present

## 2020-07-09 DIAGNOSIS — J4520 Mild intermittent asthma, uncomplicated: Secondary | ICD-10-CM

## 2020-07-09 MED ORDER — HYDRALAZINE HCL 100 MG PO TABS: 50.0000 mg | ORAL_TABLET | Freq: Three times a day (TID) | ORAL | 3 refills | Status: DC

## 2020-07-09 MED ORDER — AMLODIPINE BESYLATE 2.5 MG PO TABS: 2.5000 mg | ORAL_TABLET | Freq: Every day | ORAL | 3 refills | Status: DC

## 2020-07-09 MED ORDER — AMLODIPINE BESYLATE 2.5 MG PO TABS: 2.5000 mg | ORAL_TABLET | Freq: Every day | ORAL | 0 refills | Status: DC

## 2020-07-09 MED ORDER — BUDESONIDE-FORMOTEROL FUMARATE 160-4.5 MCG/ACT IN AERO: 2.0000 | INHALATION_SPRAY | Freq: Two times a day (BID) | RESPIRATORY_TRACT | 11 refills | Status: DC

## 2020-07-09 MED ORDER — IPRATROPIUM BROMIDE 0.06 % NA SOLN: 2.0000 | Freq: Four times a day (QID) | NASAL | 12 refills | Status: DC

## 2020-07-09 MED ORDER — LORATADINE 10 MG PO TABS: 10.0000 mg | ORAL_TABLET | Freq: Every day | ORAL | 3 refills | Status: DC | PRN

## 2020-07-09 NOTE — Progress Notes (Signed)
Chief Complaint  Patient presents with  . Follow-up  . Medication Management  . Hypertension   1 month f/u HTN 1. HTN BP coreg 3.125 mg bid causing dizziness off balance cant function or drive and h/a BP log reviewed today from 06/12/20 and BP 130s-190s/70s-80s and pt has h/o a lot of BP intolerance in the past  Wanted pt to increase hydralazine 50 tid to 100 tid last visit but she reports she cant tolerate this either and agreeable to try norvasc 2.5 mg qd again with  azilsartan-chlorthalidone 40-12.5 mg qd she has taken and tolerated in the past with hydrazine 50 tid   appt with cards 07/10/20 Dr. Tamala Julian to weigh in on HTN control, Chest pain history   2. Chronic right sided h/a pt seeing Dr. Frederico Hamman eye MD in [place] 08/2020 and previous MRI/A 05/2019 and for h/o right eye cataract IMPRESSION:  No evidence of significant carotid bifurcation disease. No stenosis  on the left. 30% stenosis of the proximal ICA on the right.   Possible 30% left vertebral artery origin stenosis. No flow limiting  stenosis identified.   Large and medium sized intracranial vessels are patent without  proximal stenosis or occlusion. More distal branch vessels do show  some minimal atherosclerotic irregularity   -no acute neuro deficits will order repeat brain imaging with h/o elevated BP   Review of Systems  Constitutional:       Per pt wt 175 at home  Eyes: Positive for pain.       +right eye pain  Respiratory: Negative for shortness of breath.   Cardiovascular: Negative for chest pain.  Musculoskeletal: Negative for falls and joint pain.  Skin: Negative for rash.  Neurological: Positive for headaches.  Psychiatric/Behavioral: Negative for depression.   Past Medical History:  Diagnosis Date  . Allergy   . 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 Asthma    with h/o chronic bronchitis  . Back pain    mid back pain h/ o trauma   . Diabetes mellitus without complication ([place])   .  Frequent headaches   . Gastric ulcer   . GERD (gastroesophageal reflux disease)   . History of chicken pox   . Hyperlipidemia   . Hypertension   . Migraines   . Thyroid disease    mng    Past Surgical History:  Procedure Laterality Date  . ABDOMINAL HYSTERECTOMY     DUB 1 ovary intact   . CHOLECYSTECTOMY     09/02/19   Family History  Problem Relation Age of Onset  . Alcohol abuse Mother   . Heart disease Mother        CHF  . Hypertension Mother   . Hyperlipidemia Mother   . Stroke Mother   . Mental illness Mother   . Alcohol abuse Father   . Cancer Father        pancreatitic   . Arthritis Sister   . COPD Sister   . Depression Sister   . Drug abuse Sister   . Hearing loss Sister   . Heart disease Sister        CHF  . Hyperlipidemia Sister   . Hypertension Sister   . Mental illness Sister   . COPD Brother   . Depression Brother   . Heart disease Brother   . Hyperlipidemia Brother   . Stroke Brother   . Asthma Son   . Arthritis Brother   . Depression Brother   .  Heart disease Brother   . Hyperlipidemia Brother   . Mental illness Brother   . Mental illness Brother   . Hyperlipidemia Brother   . Drug abuse Brother   . Diabetes Brother   . Depression Brother   . COPD Brother   . Birth defects Brother   . Arthritis Brother    Social History   Socioeconomic History  . Marital status: Widowed    Spouse name: Not on file  . Number of children: Not on file  . Years of education: Not on file  . Highest education level: Not on file  Occupational History  . Not on file  Tobacco Use  . Smoking status: Never Smoker  . Smokeless tobacco: Never Used  Substance and Sexual Activity  . Alcohol use: No  . Drug use: No  . Sexual activity: Not Currently  Other Topics Concern  . Not on file  Social History Narrative   2 sons, 4 pregnancies    Brother is [name]    Never smoker exposed 2nd hand    No guns    Wears seat belt    Safe in relationship,  widowed lives alone   2 year college ed    Social Determinants of Health   Financial Resource Strain: Low Risk   . Difficulty of Paying Living Expenses: Not hard at all  Food Insecurity: No Food Insecurity  . Worried About Charity fundraiser in the Last Year: Never true  . Ran Out of Food in the Last Year: Never true  Transportation Needs: No Transportation Needs  . Lack of Transportation (Medical): No  . Lack of Transportation (Non-Medical): No  Physical Activity: Not on file  Stress: No Stress Concern Present  . Feeling of Stress : Not at all  Social Connections: Unknown  . Frequency of Communication with Friends and Family: More than three times a week  . Frequency of Social Gatherings with Friends and Family: Not on file  . Attends Religious Services: Not on file  . Active Member of Clubs or Organizations: Not on file  . Attends Archivist Meetings: Not on file  . Marital Status: Not on file  Intimate Partner Violence: Not on file   Current Meds  Medication Sig  . albuterol (PROVENTIL HFA;VENTOLIN HFA) 108 (90 Base) MCG/ACT inhaler Inhale into the lungs.   Marland Kitchen aspirin EC 81 MG tablet Take 81 mg by mouth daily.  . Azilsartan-Chlorthalidone 40-12.5 MG TABS Take 1 tablet by mouth every morning. For blood  . Evolocumab (REPATHA) 140 MG/ML SOSY Inject 140 mg into the skin every 14 (fourteen) days.  Marland Kitchen glucose blood test strip Check sugars 1-2 times daily. 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 Insulin Pen Needle (PEN NEEDLES) 30G X 8 MM MISC 1 Device by Does not apply route every 14 (fourteen) days. For repatha or preferred pen needle dispense  . LORazepam (ATIVAN) 0.5 MG tablet Take 0.5-1 tablets (0.25-0.5 mg total) by mouth daily as needed.  . Multiple Vitamins-Minerals (MULTIVITAMIN WOMENS 50+ ADV PO) Take by mouth.  Glory Rosebush DELICA LANCETS FINE MISC USE TO CHECK BLOOD SUGAR 1-2 TIMES DAILY  . Polyethyl Glycol-Propyl Glycol (SYSTANE FREE OP) Apply 1 drop to eye daily.  .  sitaGLIPtin (JANUVIA) 25 MG tablet Take 1 tablet (25 mg total) by mouth daily.  Marland Kitchen VITAMIN D PO Take 5,000 Units by mouth daily.   . [DISCONTINUED] budesonide-formoterol (SYMBICORT) 160-4.5 MCG/ACT inhaler Inhale 2 puffs into the lungs 2 (two) times daily.  Rinse mouth for asthma  . [DISCONTINUED] carvedilol (COREG) 3.125 MG tablet Take 1 tablet (3.125 mg total) by mouth 2 (two) times daily with a meal. Increase to 25 mg bid ( 2 pills 2x per day) if BP >130/80 after 1 week. D/c nebivolol 2.5 and 5 mg  . [DISCONTINUED] hydrALAZINE (APRESOLINE) 100 MG tablet Take 1 tablet (100 mg total) by mouth 3 (three) times daily. Goal blood pressure <130/<80 for blood pressure  . [DISCONTINUED] ipratropium (ATROVENT) 0.06 % nasal spray Place 2 sprays into the nose 4 (four) times daily.  . [DISCONTINUED] loratadine (CLARITIN) 10 MG tablet Take 10 mg by mouth daily as needed for allergies.   Allergies  Allergen Reactions  . Adhesive  [Tape] Hives    Tape glue  . Alendronate Other (See Comments)    Stabbing pain in breast  . Alprazolam Other (See Comments)    Made anxious  . Amlodipine Other (See Comments)    Hives, [place] out, Black spots on face, Did not keep blood pressure down.   . Atorvastatin Other (See Comments)  . Benzalkonium Chloride Other (See Comments)    Made skin blister   . Clindamycin Other (See Comments)    Pt states that pill that is blue and red she cannot take due feeling like she was on edge  . Clonazepam Other (See Comments)    Headache   . Clonidine Other (See Comments)    Hurt chest    . Escitalopram Oxalate Other (See Comments)    Made anxious    . Gabapentin Other (See Comments)    A bad headache    . Hydrocodone-Acetaminophen Other (See Comments)    Made pain worse  . Iodinated Diagnostic Agents Swelling  . Iodine Other (See Comments)    Allergic to shell fish and x-ray dye   . Labetalol Other (See Comments)    Felt like veins are on fire   . Levofloxacin Other (See  Comments)    Made me shake  . Lisinopril-Hydrochlorothiazide Other (See Comments)    Was in a blue and pale color   . Livalo [Pitavastatin]     Rash and nausea   . Lorazepam Other (See Comments)    Formula changed   . Losartan Potassium Other (See Comments)    Blood pressure    . Losartan Potassium-Hctz Other (See Comments)    Could not feel arms  . Meloxicam Other (See Comments)    Fatal blood pressure problems will cause strokes   . Metformin Nausea And Vomiting  . Metoclopramide Other (See Comments)    No air was moving, anxious    . Oxcarbazepine Other (See Comments)    Felt like she was chocking    . Penicillins   . Plavix [Clopidogrel]     Arms and legs like going to break in 1/2 per pt   . Potassium Citrate   . Pravastatin Other (See Comments)  . Rosuvastatin Other (See Comments)  . Shellfish Allergy Swelling  . Simvastatin Other (See Comments)  . Sitagliptin Other (See Comments)    "Made toes feel weird"  . Sodium Citrate Other (See Comments)    Teeth broke off   . Spironolactone     Breast pain    . Sulfa Antibiotics Other (See Comments) and Itching  . Zetia [Ezetimibe]     Muscle aches    Recent Results (from the past 2160 hour(s))  SAR CoV2 Serology (COVID 19)AB(IGG)IA     Status: None   Collection  Time: 06/09/20  9:54 AM  Result Value Ref Range   SARS-CoV-2 Semi-Quant IgG Ab 462.0 Neg <13.0 AU/mL   SARS-CoV-2 Spike Ab Interp Positive     Comment: Antibodies against the SARS-CoV-2 spike protein, including the receptor binding domain (RBD) were detected. It is not yet known what level of antibody to SARS-CoV-2 spike protein correlates to immunity against developing symptomatic SARS-CoV-2 disease. This assay was performed using [employer] L[employer]ison(R) SARS-CoV-2 Trimeric S IgG assay.   Sodium, urine, random     Status: None   Collection Time: 06/09/20  9:54 AM  Result Value Ref Range   Sodium, Ur 31 28 - 272 mmol/L  Microalbumin / creatinine urine  ratio     Status: None   Collection Time: 06/09/20  9:54 AM  Result Value Ref Range   Creatinine, Urine 49 20 - 275 mg/dL   Microalb, Ur 0.8 mg/dL    Comment: Reference Range Not established    Microalb Creat Ratio 16 <30 mcg/mg creat    Comment: . The ADA defines abnormalities in albumin excretion as follows: Marland Kitchen Albuminuria Category        Result (mcg/mg creatinine) . Normal to Mildly increased   <30 Moderately increased         30-299  Severely increased           > OR = 300 . The ADA recommends that at least two of three specimens collected within a 3-6 month period be abnormal before considering a patient to be within a diagnostic category.   CBC with Differential/Platelet     Status: None   Collection Time: 06/09/20  9:54 AM  Result Value Ref Range   WBC 4.4 4.0 - 10.5 K/uL   RBC 4.97 3.87 - 5.11 Mil/uL   Hemoglobin 14.7 12.0 - 15.0 g/dL   HCT 43.3 36.0 - 46.0 %   MCV 87.1 78.0 - 100.0 fl   MCHC 33.9 30.0 - 36.0 g/dL   RDW 12.9 11.5 - 15.5 %   Platelets 234.0 150.0 - 400.0 K/uL   Neutrophils Relative % 57.5 43.0 - 77.0 %   Lymphocytes Relative 28.1 12.0 - 46.0 %   Monocytes Relative 11.4 3.0 - 12.0 %   Eosinophils Relative 2.1 0.0 - 5.0 %   Basophils Relative 0.9 0.0 - 3.0 %   Neutro Abs 2.5 1.4 - 7.7 K/uL   Lymphs Abs 1.2 0.7 - 4.0 K/uL   Monocytes Absolute 0.5 0.1 - 1.0 K/uL   Eosinophils Absolute 0.1 0.0 - 0.7 K/uL   Basophils Absolute 0.0 0.0 - 0.1 K/uL  Comprehensive metabolic panel     Status: Abnormal   Collection Time: 06/09/20  9:54 AM  Result Value Ref Range   Sodium 134 (L) 135 - 145 mEq/L   Potassium 4.2 3.5 - 5.1 mEq/L   Chloride 97 96 - 112 mEq/L   CO2 31 19 - 32 mEq/L   Glucose, Bld 105 (H) 70 - 99 mg/dL   BUN 8 6 - 23 mg/dL   Creatinine, Ser 0.96 0.40 - 1.20 mg/dL   Total Bilirubin 0.6 0.2 - 1.2 mg/dL   Alkaline Phosphatase 68 39 - 117 U/L   AST 18 0 - 37 U/L   ALT 12 0 - 35 U/L   Total Protein 6.9 6.0 - 8.3 g/dL   Albumin 4.0 3.5 - 5.2  g/dL   GFR 58.73 (L) >60.00 mL/min    Comment: Calculated using the CKD-EPI Creatinine Equation (2021)   Calcium 9.8 8.4 -  10.5 mg/dL  TSH     Status: None   Collection Time: 06/11/20  7:19 AM  Result Value Ref Range   TSH 1.27 0.35 - 4.50 uIU/mL  Hemoglobin A1c     Status: None   Collection Time: 06/11/20  7:19 AM  Result Value Ref Range   Hgb A1c MFr Bld 6.3 4.6 - 6.5 %    Comment: Glycemic Control Guidelines for People with Diabetes:Non Diabetic:  <6%Goal of Therapy: <7%Additional Action Suggested:  >8%   Lipid panel     Status: None   Collection Time: 06/11/20  7:19 AM  Result Value Ref Range   Cholesterol 162 0 - 200 mg/dL    Comment: ATP III Classification       Desirable:  < 200 mg/dL               Borderline High:  200 - 239 mg/dL          High:  > = 240 mg/dL   Triglycerides 84.0 0.0 - 149.0 mg/dL    Comment: Normal:  <150 mg/dLBorderline High:  150 - 199 mg/dL   HDL 63.00 >39.00 mg/dL   VLDL 16.8 0.0 - 40.0 mg/dL   LDL Cholesterol 82 0 - 99 mg/dL   Total CHOL/HDL Ratio 3     Comment:                Men          Women1/2 Average Risk     3.4          3.3Average Risk          5.0          4.42X Average Risk          9.6          7.13X Average Risk          15.0          11.0                       NonHDL 99.02     Comment: NOTE:  Non-HDL goal should be 30 mg/dL higher than patient's LDL goal (i.e. LDL goal of < 70 mg/dL, would have non-HDL goal of < 100 mg/dL)   Objective  Body mass index is 31.86 kg/m. Wt Readings from Last 3 Encounters:  07/09/20 185 lb 9.6 oz (84.2 kg)  07/07/20 181 lb (82.1 kg)  06/10/20 184 lb 9.6 oz (83.7 kg)   Temp Readings from Last 3 Encounters:  07/09/20 97.8 F (36.6 C) (Oral)  07/07/20 (!) 97.5 F (36.4 C) (Temporal)  06/10/20 98.4 F (36.9 C)   BP Readings from Last 3 Encounters:  07/09/20 138/90  07/07/20 (!) 157/102  06/10/20 (!) 147/83   Pulse Readings from Last 3 Encounters:  07/09/20 77  07/07/20 74  06/10/20 70     Physical Exam Vitals and nursing note reviewed.  Constitutional:      Appearance: Normal appearance. She is well-developed and well-groomed. She is obese.  HENT:     Head: Normocephalic and atraumatic.  Eyes:     Conjunctiva/sclera: Conjunctivae normal.     Pupils: Pupils are equal, round, and reactive to light.   Cardiovascular:     Rate and Rhythm: Normal rate and regular rhythm.     Heart sounds: Normal heart sounds. No murmur heard.   Pulmonary:     Effort: Pulmonary effort is normal.     Breath sounds:  Normal breath sounds.  Skin:    General: Skin is warm and dry.  Neurological:     General: No focal deficit present.     Mental Status: She is alert and oriented to person, place, and time. Mental status is at baseline.     Gait: Gait normal.     Comments: CN 2-12 grossly intact  Psychiatric:        Attention and Perception: Attention and perception normal.        Mood and Affect: Mood and affect normal.        Speech: Speech normal.        Behavior: Behavior normal. Behavior is cooperative.        Thought Content: Thought content normal.        Cognition and Memory: Cognition and memory normal.        Judgment: Judgment normal.     Assessment  Plan  Hypertension, with h/o right RAS (per vascular no current intervention indicated Dr. Carlis Abbott) HTN uncontrolled- Plan: amLODipine (NORVASC) 2.5 MG tablet [facility] and HT short supply Hydralazine 50 mg tid per pt cant tolerate 100 tid  azilsartan-chlorthalidone 40-12.5 mg qd Will have cards weigh in on BP a lot of BP med intolerances in the past BP log given  Chronic intractable headache, unspecified headache type with h/o dizziness, off balance, right sided h/a and abnormal gait with HTN uncontrolled - Plan: MR Brain Wo Contrast, MR Angiogram Head Wo Contrast Intracranial atherosclerosis - Plan: MR Brain Wo Contrast, MR Angiogram Head Wo Contrast   Mild intermittent asthma, unspecified whether complicated - Plan:  budesonide-formoterol (SYMBICORT) 160-4.5 MCG/ACT inhaler, loratadine (CLARITIN) 10 MG tablet, ipratropium (ATROVENT) 0.06 % nasal spray  Allergic rhinitis, unspecified seasonality, unspecified trigger - Plan: loratadine (CLARITIN) 10 MG tablet, ipratropium (ATROVENT) 0.06 % nasal spray   Cataract of right eye, unspecified cataract type F/u Dr. Frederico Hamman 08/2020 in [place]   HM Declines flu shot  utd prevnar and pna 23 RxTdap givenpreviously Declinesshingrix Pfizer 2/2 consider booster in future   LMP 07/1997 pap 12/09/14 neg pap neg HPV s/p hysterectomy no h/o abnormal pap 1 ovary intact ? Which one  Mammogramfat necrosis right breast 11/14/19and 10/09/18 normal Repeat 05/07/20 negative GI breast center   Colonoscopy 05/18/16 tubular adenoma Dr. Jerene Pitch GI f/u in 5 years and FH colon cancer in dad  DEXA 05/31/16 osteopenia consider repeat in 3-5 years vit D normal85.5 07/07/17 05/07/20 osteopenia rec calcium and vitamin D  Hep C neg 07/07/17  Never smoker h/o 2nd hand exposure  US thyroid nodules/MNG 06/14/18 ordered repeat due to abnormla   rec healthy diet and exercise   Provider: Dr. Olivia Mackie McLean-Scocuzza-Internal Medicine

## 2020-07-09 NOTE — Patient Instructions (Signed)
Goal blood pressure <130/<80   Discuss with Dr. Tamala Julian 07/10/20

## 2020-07-09 NOTE — Telephone Encounter (Signed)
Seen today   Marissa King

## 2020-07-09 NOTE — Progress Notes (Signed)
Patient's carvedilol dose was reduced to the 3.125. Patient states she still has headaches, dizziness, loss of balance with the low dose. States she can not function and has to hold on to the wall to walk when she takes the Carvedilol.

## 2020-07-10 ENCOUNTER — Ambulatory Visit (INDEPENDENT_AMBULATORY_CARE_PROVIDER_SITE_OTHER): Payer: Medicare Other | Admitting: Interventional Cardiology

## 2020-07-10 ENCOUNTER — Encounter: Payer: Self-pay | Admitting: Interventional Cardiology

## 2020-07-10 ENCOUNTER — Telehealth: Payer: Self-pay | Admitting: Internal Medicine

## 2020-07-10 VITALS — BP 150/90 | HR 76 | Ht 64.0 in | Wt 186.0 lb

## 2020-07-10 DIAGNOSIS — E785 Hyperlipidemia, unspecified: Secondary | ICD-10-CM | POA: Diagnosis not present

## 2020-07-10 DIAGNOSIS — I5189 Other ill-defined heart diseases: Secondary | ICD-10-CM | POA: Diagnosis not present

## 2020-07-10 DIAGNOSIS — Z6835 Body mass index (BMI) 35.0-35.9, adult: Secondary | ICD-10-CM

## 2020-07-10 DIAGNOSIS — I10 Essential (primary) hypertension: Secondary | ICD-10-CM | POA: Diagnosis not present

## 2020-07-10 DIAGNOSIS — Z823 Family history of stroke: Secondary | ICD-10-CM | POA: Diagnosis not present

## 2020-07-10 DIAGNOSIS — E1169 Type 2 diabetes mellitus with other specified complication: Secondary | ICD-10-CM

## 2020-07-10 DIAGNOSIS — E669 Obesity, unspecified: Secondary | ICD-10-CM

## 2020-07-10 DIAGNOSIS — Z7189 Other specified counseling: Secondary | ICD-10-CM

## 2020-07-10 NOTE — Patient Instructions (Signed)
Medication Instructions:  Your physician recommends that you continue on your current medications as directed. Please refer to the Current Medication list given to you today.  *If you need a refill on your cardiac medications before your next appointment, please call your pharmacy*   Lab Work: None If you have labs (blood work) drawn today and your tests are completely normal, you will receive your results only by: . MyChart Message (if you have MyChart) OR . A paper copy in the mail If you have any lab test that is abnormal or we need to change your treatment, we will call you to review the results.   Testing/Procedures: None   Follow-Up: At [facility], you and your health needs are our priority.  As part of our continuing mission to provide you with exceptional heart care, we have created designated Provider Care Teams.  These Care Teams include your primary Cardiologist (physician) and Advanced Practice Providers (APPs -  Physician Assistants and Nurse Practitioners) who all work together to provide you with the care you need, when you need it.  We recommend signing up for the patient portal called "MyChart".  Sign up information is provided on this After Visit Summary.  MyChart is used to connect with patients for Virtual Visits (Telemedicine).  Patients are able to view lab/test results, encounter notes, upcoming appointments, etc.  Non-urgent messages can be sent to your provider as well.   To learn more about what you can do with MyChart, go to [email or web address].    Your next appointment:   As needed  The format for your next appointment:   In Person  Provider:   You may see Henry W Smith III, MD or one of the following Advanced Practice Providers on your designated Care Team:    Lori Gerhardt, NP  Laura Ingold, NP  Jill McDaniel, NP    Other Instructions   

## 2020-07-10 NOTE — Telephone Encounter (Signed)
Faxed to [facility] for refill for Norvasc faxed on 07-10-20

## 2020-07-13 DIAGNOSIS — M4802 Spinal stenosis, cervical region: Secondary | ICD-10-CM | POA: Diagnosis not present

## 2020-07-13 DIAGNOSIS — R2689 Other abnormalities of gait and mobility: Secondary | ICD-10-CM | POA: Diagnosis not present

## 2020-07-15 ENCOUNTER — Telehealth: Payer: Self-pay

## 2020-07-15 ENCOUNTER — Other Ambulatory Visit: Payer: Self-pay | Admitting: Internal Medicine

## 2020-07-15 DIAGNOSIS — I10 Essential (primary) hypertension: Secondary | ICD-10-CM

## 2020-07-15 MED ORDER — AMLODIPINE BESYLATE 2.5 MG PO TABS: 2.5000 mg | ORAL_TABLET | Freq: Every day | ORAL | 3 refills | Status: DC

## 2020-07-15 NOTE — Telephone Encounter (Signed)
Pt said her prescription for amlodipine needs to go to [facility] [address] [address]  [place] [address]  [phone]  She said the one in [place] is not the right one.

## 2020-07-15 NOTE — Telephone Encounter (Signed)
There is [facility] in [place]

## 2020-07-15 NOTE — Telephone Encounter (Signed)
Unable to leave message for patient to return call back, phone kept ringing.

## 2020-07-15 NOTE — Telephone Encounter (Signed)
Can not find this pharmacy in [place].

## 2020-07-15 NOTE — Telephone Encounter (Signed)
duplicate

## 2020-07-21 ENCOUNTER — Ambulatory Visit: Payer: Medicare Other | Admitting: Pharmacist

## 2020-07-21 DIAGNOSIS — T466X5A Adverse effect of antihyperlipidemic and antiarteriosclerotic drugs, initial encounter: Secondary | ICD-10-CM

## 2020-07-21 DIAGNOSIS — I701 Atherosclerosis of renal artery: Secondary | ICD-10-CM

## 2020-07-21 DIAGNOSIS — M791 Myalgia, unspecified site: Secondary | ICD-10-CM

## 2020-07-21 DIAGNOSIS — E785 Hyperlipidemia, unspecified: Secondary | ICD-10-CM

## 2020-07-21 DIAGNOSIS — I10 Essential (primary) hypertension: Secondary | ICD-10-CM

## 2020-07-21 NOTE — Patient Instructions (Signed)
Visit Information  Patient Care Plan: Medication Management  Completed 07/21/2020  Problem Identified: HTN, HLD Resolved 07/21/2020    Long-Range Goal: Disease Progression Prevention Completed 07/21/2020  Start Date: 05/01/2020  This Visit's Progress: Not on track  Recent Progress: Not on track  Priority: High  Note:   Current Barriers:  . Does not adhere to prescribed medication regimen . Unable to maintain control of blood pressure and hyperlipidemia  Pharmacist Clinical Goal(s):  Marland Kitchen Over the next 90 days, patient will maintain control of blood pressure and hyperlipidemia through collaboration with PharmD and provider.   Interventions: . 1:1 collaboration with McLean-Scocuzza, Nino Glow, MD regarding development and update of comprehensive plan of care as evidenced by provider attestation and co-signature . Inter-disciplinary care team collaboration (see longitudinal plan of care) . Comprehensive medication review performed; medication list updated in electronic medical record  Diabetes: . Controlled; current treatment: Januvia 25 mg daily  o Hx intolerance to metformin d/t nausea/vomiting . Current glucose readings: reports readings remain well controlled, but does not verbalize specific readings.  . Recommend to continue current regimen at this time  Hypertension: . Uncontrolled but improved; current treatment: amlodipine 2.5 mg daily, hydralazine 25 mg TID. Reports that she had been taking azilsartan 40/12.5 mg daily, but then had back "pressure" from having to urinate so much, so has been holding this past week. Plans to restart in the next few days.  . Current home readings: 145/83, 148/81, 140/83; 133/76 over the past few days, though is unable to verbalize if these are with azilsartan/HCTZ or not.  . Encouraged continued medication adherence. Reviewed importance of maintenance of goal BP. Discussed that next steps would likely be increasing amlodipine vs adding eplerenone per  Dr. Thompson Caul last note. Explained differences between eplerenone and spironolcatone. Patient notes that she needs a month and a half to decide if this regimen is going to work for her, so she plans to reach out to Dr. Tamala Julian in about a month to follow up.  . Reviewed importance of low sodium diet in regards to BP management  Hyperlipidemia and ASCVD risk reduction: . Controlled; current treatment: Repatha 140 mg Q14 days  . Medications previously tried: (atorvastatin, pitavastatin, pravastatin, rosuvastatin, simvastatin, ezetimibe). . Antiplatelet regimen: aspirin 81 mg daily . Continue current regimen at this time. Discussed letting injection come to room temperature prior to injecting.   Chronic Obstructive Pulmonary Disease: . Controlled per patient report; current treatment: dx per documentation, no hx spirometry on file. Symbicort 160/4.5 mcg PRN SOB; Cetirizine PRN and ipratropium intranasal PRN allergy symptoms . Most recent Pulmonary Function Testing: none . Continue current regimen at this time as tolerated by patient.   Patient Goals/Self-Care Activities . Over the next 90 days, patient will:  - take medications as prescribed check blood pressure daily to twice daily, document, and provide at future appointments  Follow Up Plan: Patient declines continued CCM follow up at this time.       The patient verbalized understanding of instructions, educational materials, and care plan provided today and declined offer to receive copy of patient instructions, educational materials, and care plan.   Plan: Patient declines continued CCM follow up at this time. Patient has my contact information for future questions or concerns.   Catie Darnelle Maffucci, PharmD, [place], [place] Clinical Pharmacist [employer] [facility] [place]

## 2020-07-21 NOTE — Chronic Care Management (AMB) (Signed)
**Note Marissa-Identified via Obfuscation** Chronic Care Management   Pharmacy Note  07/21/2020 Name: Marissa King MRN: [id number] DOB: 1946/07/14  Subjective:  Marissa King is a 74 y.o. year old female who is a primary care patient of McLean-Scocuzza, Nino Glow, MD. The CCM team was consulted for assistance with chronic disease management and care coordination needs.    Engaged with patient by telephone for follow up visit in response to provider referral for pharmacy case management and/or care coordination services.   Consent to Services:  [name] was given information about Chronic Care Management services, agreed to services, and gave verbal consent prior to initiation of services on 03/11/20. Please see initial visit note for detailed documentation.   Objective:  Lab Results  Component Value Date   CREATININE 0.96 06/09/2020   CREATININE 1.01 12/18/2019   CREATININE 0.87 02/21/2019    Lab Results  Component Value Date   HGBA1C 6.3 06/11/2020       Component Value Date/Time   CHOL 162 06/11/2020 0719   TRIG 84.0 06/11/2020 0719   HDL 63.00 06/11/2020 0719   CHOLHDL 3 06/11/2020 0719   VLDL 16.8 06/11/2020 0719   LDLCALC 82 06/11/2020 0719    Other: (TSH, CBC, Vit D, etc.)  Clinical ASCVD: No  The 10-year ASCVD risk score Mikey Bussing DC Jr., et al., 2013) is: 33%   Values used to calculate the score:     Age: 63 years     Sex: Female     Is Non-Hispanic African American: Yes     Diabetic: Yes     Tobacco smoker: No     Systolic Blood Pressure: 354 mmHg     Is BP treated: Yes     HDL Cholesterol: 63 mg/dL     Total Cholesterol: 162 mg/dL    Other: (CHADS2VASc if Afib, PHQ9 if depression, MMRC or CAT for COPD, ACT)  BP Readings from Last 3 Encounters:  07/10/20 (!) 150/90  07/09/20 138/90  07/07/20 (!) 157/102    Assessment/Interventions: Review of patient past medical history, allergies, medications, health status, including review of consultants reports, laboratory and other test data, was performed  as part of comprehensive evaluation and provision of chronic care management services.   SDOH (Social Determinants of Health) assessments and interventions performed:  SDOH Interventions   Flowsheet Row Most Recent Value  SDOH Interventions   Financial Strain Interventions Intervention Not Indicated       CCM Care Plan  Allergies  Allergen Reactions  . Adhesive  [Tape] Hives    Tape glue  . Alendronate Other (See Comments)    Stabbing pain in breast  . Alprazolam Other (See Comments)    Made anxious  . Amlodipine Other (See Comments)    Hives, [place] out, Black spots on face, Did not keep blood pressure down.   . Atorvastatin Other (See Comments)  . Benzalkonium Chloride Other (See Comments)    Made skin blister   . Clindamycin Other (See Comments)    Pt states that pill that is blue and red she cannot take due feeling like she was on edge  . Clonazepam Other (See Comments)    Headache   . Clonidine Other (See Comments)    Hurt chest    . Escitalopram Oxalate Other (See Comments)    Made anxious    . Gabapentin Other (See Comments)    A bad headache    . Hydrocodone-Acetaminophen Other (See Comments)    Made pain worse  . Iodinated Diagnostic Agents Swelling  .  Iodine Other (See Comments)    Allergic to shell fish and x-ray dye   . Labetalol Other (See Comments)    Felt like veins are on fire   . Levofloxacin Other (See Comments)    Made me shake  . Lisinopril-Hydrochlorothiazide Other (See Comments)    Was in a blue and pale color   . Livalo [Pitavastatin]     Rash and nausea   . Lorazepam Other (See Comments)    Formula changed   . Losartan Potassium Other (See Comments)    Blood pressure    . Losartan Potassium-Hctz Other (See Comments)    Could not feel arms  . Meloxicam Other (See Comments)    Fatal blood pressure problems will cause strokes   . Metformin Nausea And Vomiting  . Metoclopramide Other (See Comments)    No air was moving, anxious     . Oxcarbazepine Other (See Comments)    Felt like she was chocking    . Penicillins   . Plavix [Clopidogrel]     Arms and legs like going to break in 1/2 per pt   . Potassium Citrate   . Pravastatin Other (See Comments)  . Rosuvastatin Other (See Comments)  . Shellfish Allergy Swelling  . Simvastatin Other (See Comments)  . Sitagliptin Other (See Comments)    "Made toes feel weird"  . Sodium Citrate Other (See Comments)    Teeth broke off   . Spironolactone     Breast pain    . Sulfa Antibiotics Other (See Comments) and Itching  . Zetia [Ezetimibe]     Muscle aches     Medications Reviewed Today    Reviewed by Marissa King, Marissa King (Pharmacist) on 07/21/20 at 1426  Med List Status: <None>  Medication Order Taking? Sig Documenting Provider Last Dose Status Informant  albuterol (PROVENTIL HFA;VENTOLIN HFA) 108 (90 Base) MCG/ACT inhaler [id number] Yes Inhale into the lungs.  [provider] Taking Active   amLODipine (NORVASC) 2.5 MG tablet [id number] Yes Take 1 tablet (2.5 mg total) by mouth daily. McLean-Scocuzza, Nino Glow, MD Taking Active   aspirin EC 81 MG tablet [id number] Yes Take 81 mg by mouth daily. [provider] Taking Active   Azilsartan-Chlorthalidone 40-12.5 MG TABS [id number] Yes Take 1 tablet by mouth every morning. For blood McLean-Scocuzza, Nino Glow, MD Taking Active   budesonide-formoterol [facility]) 160-4.5 MCG/ACT inhaler [id number] Yes Inhale 2 puffs into the lungs 2 (two) times daily. Rinse mouth for asthma McLean-Scocuzza, Nino Glow, MD Taking Active   Evolocumab (REPATHA) 140 MG/ML SOSY [id number] Yes Inject 140 mg into the skin every 14 (fourteen) days. McLean-Scocuzza, Nino Glow, MD Taking Active   glucose blood test strip [id number] Yes Check sugars 1-2 times daily. E11.9 Non-insulin dependent [provider] Taking Active   hydrALAZINE (APRESOLINE) 100 MG tablet [id number] Yes Take 0.5 tablets (50 mg total) by mouth 3 (three) times  daily. Goal blood pressure <130/<80 for blood pressure McLean-Scocuzza, Nino Glow, MD Taking Active            Med Note ([place] Jul 21, 2020  2:20 PM) 25 mg TID  Insulin Pen Needle (PEN NEEDLES) 30G X 8 MM MISC [id number] Yes 1 Device by Does not apply route every 14 (fourteen) days. For repatha or preferred pen needle dispense McLean-Scocuzza, Nino Glow, MD Taking Active   ipratropium (ATROVENT) 0.06 % nasal spray [id number] Yes Place 2 sprays into the nose 4 (four) times daily. McLean-Scocuzza, Olivia Mackie  N, MD Taking Active   loratadine (CLARITIN) 10 MG tablet [id number] Yes Take 1 tablet (10 mg total) by mouth daily as needed for allergies. McLean-Scocuzza, Nino Glow, MD Taking Active   LORazepam (ATIVAN) 0.5 MG tablet [id number] Yes Take 0.5-1 tablets (0.25-0.5 mg total) by mouth daily as needed. McLean-Scocuzza, Nino Glow, MD Taking Active   Multiple Vitamins-Minerals (MULTIVITAMIN WOMENS 50+ ADV PO) [id number] Yes Take by mouth. [provider] Taking Active   [facility] LANCETS FINE [place] [id number] Yes USE TO CHECK BLOOD SUGAR 1-2 TIMES DAILY [provider] Taking Active   Polyethyl Glycol-Propyl Glycol (SYSTANE FREE OP) [id number] Yes Apply 1 drop to eye daily. [provider] Taking Active   sitaGLIPtin (JANUVIA) 25 MG tablet [id number] Yes Take 1 tablet (25 mg total) by mouth daily. McLean-Scocuzza, Nino Glow, MD Taking Active   VITAMIN D PO [id number] Yes Take 5,000 Units by mouth daily.  [provider] Taking Active           Patient Active Problem List   Diagnosis Date Noted  . Intracranial atherosclerosis 07/09/2020  . 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[place]) 06/23/2020  . Obesity, diabetes, and hypertension syndrome ([place]) 06/16/2020  . Goiter 06/16/2020  . Age-related cataract of both eyes 06/10/2020  . Hypertension associated with diabetes ([place]) 02/06/2020  . Vision loss, right eye 02/06/2020  . Myopathy, unspecified 02/06/2020  . Chronic  midline low back pain without sciatica 10/17/2019  . S/P laparoscopic cholecystectomy 09/24/2019  . Cataract of right eye 09/05/2019  . Insomnia 09/05/2019  . Atrial tachycardia ([place]) 09/05/2019  . Anxiety 09/05/2019  . 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 Combined forms of age-related cataract of both eyes 07/12/2019  . Carotid artery stenosis 06/23/2019  . MRI of brain abnormal 06/23/2019  . Gallstones 05/22/2019  . Thyroid nodule 05/22/2019  . Leg edema 02/13/2019  . Cervicalgia 10/04/2018  . Mid back pain 10/04/2018  . Toe pain, left 06/07/2018  . Arthritis 06/07/2018  . Type 2 diabetes mellitus without complication, without long-term current use of insulin ([place]) 06/07/2018  . Multinodular goiter 06/07/2018  . Essential hypertension 06/07/2018  . Hyperlipidemia 06/07/2018  . Chest pain 06/07/2018  . Mild intermittent asthma 06/07/2018  . Local edema 02/02/2018  . Skin infection 10/06/2016  . Osteopenia of multiple sites 10/05/2016  . Cervical radiculopathy 09/10/2016  . Barrett's esophagus with dysplasia 04/23/2016  . Chronic constipation 09/28/2015  . COPD (chronic obstructive pulmonary disease) ([place]) 09/28/2015  . GERD (gastroesophageal reflux disease) 09/28/2015  . History of stroke 09/28/2015  . Migraine 09/28/2015  . Obesity 09/28/2015  . Small vessel disease ([place]) 09/28/2015  . Vasomotor rhinitis 09/28/2015    Conditions to be addressed/monitored per PCP order: HTN and HLD  Patient Care Plan: Medication Management  Completed 07/21/2020  Problem Identified: HTN, HLD Resolved 07/21/2020    Long-Range Goal: Disease Progression Prevention Completed 07/21/2020  Start Date: 05/01/2020  This Visit's Progress: Not on track  Recent Progress: Not on track  Priority: High  Note:   Current Barriers:  . Does not adhere to prescribed medication regimen . Unable to maintain control of blood pressure and hyperlipidemia  Pharmacist Clinical  Goal(s):  Marland Kitchen Over the next 90 days, patient will maintain control of blood pressure and hyperlipidemia through collaboration with PharmD and provider.   Interventions: . 1:1 collaboration with McLean-Scocuzza, Nino Glow, MD regarding development and update of comprehensive plan of care as evidenced by provider attestation and co-signature . Inter-disciplinary care team  collaboration (see longitudinal plan of care) . Comprehensive medication review performed; medication list updated in electronic medical record  Diabetes: . Controlled; current treatment: Januvia 25 mg daily  o Hx intolerance to metformin d/t nausea/vomiting . Current glucose readings: reports readings remain well controlled, but does not verbalize specific readings.  . Recommend to continue current regimen at this time  Hypertension: . Uncontrolled but improved; current treatment: amlodipine 2.5 mg daily, hydralazine 25 mg TID. Reports that she had been taking azilsartan 40/12.5 mg daily, but then had back "pressure" from having to urinate so much, so has been holding this past week. Plans to restart in the next few days.  . Current home readings: 145/83, 148/81, 140/83; 133/76 over the past few days, though is unable to verbalize if these are with azilsartan/HCTZ or not.  . Encouraged continued medication adherence. Reviewed importance of maintenance of goal BP. Discussed that next steps would likely be increasing amlodipine vs adding eplerenone per Dr. Thompson Caul last note. Explained differences between eplerenone and spironolcatone. Patient notes that she needs a month and a half to decide if this regimen is going to work for her, so she plans to reach out to Dr. Tamala Julian in about a month to follow up.  . Reviewed importance of low sodium diet in regards to BP management  Hyperlipidemia and ASCVD risk reduction: . Controlled; current treatment: Repatha 140 mg Q14 days  . Medications previously tried: (atorvastatin, pitavastatin,  pravastatin, rosuvastatin, simvastatin, ezetimibe). . Antiplatelet regimen: aspirin 81 mg daily . Continue current regimen at this time. Discussed letting injection come to room temperature prior to injecting.   Chronic Obstructive Pulmonary Disease: . Controlled per patient report; current treatment: dx per documentation, no hx spirometry on file. Symbicort 160/4.5 mcg PRN SOB; Cetirizine PRN and ipratropium intranasal PRN allergy symptoms . Most recent Pulmonary Function Testing: none . Continue current regimen at this time as tolerated by patient.   Patient Goals/Self-Care Activities . Over the next 90 days, patient will:  - take medications as prescribed check blood pressure daily to twice daily, document, and provide at future appointments  Follow Up Plan: Patient declines continued CCM follow up at this time.      Medication Assistance: None required. Patient affirms current coverage meets needs.   Plan: Patient declines continued CCM follow up at this time. Patient has my contact information for future questions or concerns.   Catie Darnelle Maffucci, PharmD, [place], [place] Clinical Pharmacist [employer] at [place]

## 2020-07-27 DIAGNOSIS — I10 Essential (primary) hypertension: Secondary | ICD-10-CM | POA: Diagnosis not present

## 2020-07-27 DIAGNOSIS — I672 Cerebral atherosclerosis: Secondary | ICD-10-CM | POA: Diagnosis not present

## 2020-07-27 DIAGNOSIS — R519 Headache, unspecified: Secondary | ICD-10-CM | POA: Diagnosis not present

## 2020-07-27 DIAGNOSIS — R42 Dizziness and giddiness: Secondary | ICD-10-CM | POA: Diagnosis not present

## 2020-07-27 DIAGNOSIS — R269 Unspecified abnormalities of gait and mobility: Secondary | ICD-10-CM | POA: Diagnosis not present

## 2020-08-20 DIAGNOSIS — E1165 Type 2 diabetes mellitus with hyperglycemia: Secondary | ICD-10-CM | POA: Diagnosis not present

## 2020-08-20 DIAGNOSIS — H5340 Unspecified visual field defects: Secondary | ICD-10-CM | POA: Diagnosis not present

## 2020-08-20 DIAGNOSIS — I639 Cerebral infarction, unspecified: Secondary | ICD-10-CM | POA: Diagnosis not present

## 2020-08-20 DIAGNOSIS — H538 Other visual disturbances: Secondary | ICD-10-CM | POA: Diagnosis not present

## 2020-08-20 DIAGNOSIS — H2511 Age-related nuclear cataract, right eye: Secondary | ICD-10-CM | POA: Diagnosis not present

## 2020-08-25 ENCOUNTER — Telehealth: Payer: Self-pay

## 2020-08-25 NOTE — Telephone Encounter (Signed)
Completed PA for Repatha and sent it in to CoverMyMeds KEY: [id number]

## 2020-08-26 NOTE — Telephone Encounter (Signed)
PA canceled by plan as this had previously completed and approved. States the PA has not expired as of yet.

## 2020-08-27 DIAGNOSIS — H3411 Central retinal artery occlusion, right eye: Secondary | ICD-10-CM | POA: Diagnosis not present

## 2020-09-28 ENCOUNTER — Other Ambulatory Visit: Payer: Self-pay | Admitting: Internal Medicine

## 2020-09-28 DIAGNOSIS — I152 Hypertension secondary to endocrine disorders: Secondary | ICD-10-CM

## 2020-09-28 DIAGNOSIS — I6529 Occlusion and stenosis of unspecified carotid artery: Secondary | ICD-10-CM

## 2020-09-28 DIAGNOSIS — E785 Hyperlipidemia, unspecified: Secondary | ICD-10-CM

## 2020-09-28 DIAGNOSIS — I10 Essential (primary) hypertension: Secondary | ICD-10-CM

## 2020-09-28 DIAGNOSIS — E7849 Other hyperlipidemia: Secondary | ICD-10-CM

## 2020-09-28 DIAGNOSIS — E1159 Type 2 diabetes mellitus with other circulatory complications: Secondary | ICD-10-CM

## 2020-10-05 DIAGNOSIS — H25813 Combined forms of age-related cataract, bilateral: Secondary | ICD-10-CM | POA: Diagnosis not present

## 2020-10-05 DIAGNOSIS — H35031 Hypertensive retinopathy, right eye: Secondary | ICD-10-CM | POA: Diagnosis not present

## 2020-10-08 ENCOUNTER — Ambulatory Visit (INDEPENDENT_AMBULATORY_CARE_PROVIDER_SITE_OTHER): Payer: Medicare Other | Admitting: Internal Medicine

## 2020-10-08 ENCOUNTER — Other Ambulatory Visit: Payer: Self-pay

## 2020-10-08 ENCOUNTER — Encounter: Payer: Self-pay | Admitting: Internal Medicine

## 2020-10-08 VITALS — BP 104/82 | HR 79 | Temp 97.6°F | Ht 64.0 in | Wt 187.2 lb

## 2020-10-08 DIAGNOSIS — Z1231 Encounter for screening mammogram for malignant neoplasm of breast: Secondary | ICD-10-CM | POA: Diagnosis not present

## 2020-10-08 DIAGNOSIS — Z1389 Encounter for screening for other disorder: Secondary | ICD-10-CM

## 2020-10-08 DIAGNOSIS — I10 Essential (primary) hypertension: Secondary | ICD-10-CM | POA: Diagnosis not present

## 2020-10-08 DIAGNOSIS — E1159 Type 2 diabetes mellitus with other circulatory complications: Secondary | ICD-10-CM | POA: Diagnosis not present

## 2020-10-08 DIAGNOSIS — H269 Unspecified cataract: Secondary | ICD-10-CM

## 2020-10-08 DIAGNOSIS — I152 Hypertension secondary to endocrine disorders: Secondary | ICD-10-CM | POA: Diagnosis not present

## 2020-10-08 DIAGNOSIS — R7303 Prediabetes: Secondary | ICD-10-CM | POA: Diagnosis not present

## 2020-10-08 DIAGNOSIS — E041 Nontoxic single thyroid nodule: Secondary | ICD-10-CM | POA: Diagnosis not present

## 2020-10-08 LAB — COMPREHENSIVE METABOLIC PANEL
ALT: 15 U/L (ref 0–35)
AST: 21 U/L (ref 0–37)
Albumin: 4 g/dL (ref 3.5–5.2)
Alkaline Phosphatase: 72 U/L (ref 39–117)
BUN: 11 mg/dL (ref 6–23)
CO2: 29 mEq/L (ref 19–32)
Calcium: 9.8 mg/dL (ref 8.4–10.5)
Chloride: 100 mEq/L (ref 96–112)
Creatinine, Ser: 0.97 mg/dL (ref 0.40–1.20)
GFR: 57.87 mL/min — ABNORMAL LOW (ref >60.00)
Glucose, Bld: 95 mg/dL (ref 70–99)
Potassium: 4.4 mEq/L (ref 3.5–5.1)
Sodium: 136 mEq/L (ref 135–145)
Total Bilirubin: 0.5 mg/dL (ref 0.2–1.2)
Total Protein: 7.8 g/dL (ref 6.0–8.3)

## 2020-10-08 LAB — LIPID PANEL
Cholesterol: 143 mg/dL (ref 0–200)
HDL: 65.4 mg/dL (ref >39.00)
LDL Cholesterol: 65 mg/dL (ref 0–99)
NonHDL: 77.27
Total CHOL/HDL Ratio: 2
Triglycerides: 60 mg/dL (ref 0.0–149.0)
VLDL: 12 mg/dL (ref 0.0–40.0)

## 2020-10-08 LAB — CBC WITH DIFFERENTIAL/PLATELET
Basophils Absolute: 0 10*3/uL (ref 0.0–0.1)
Basophils Relative: 0.7 % (ref 0.0–3.0)
Eosinophils Absolute: 0.1 10*3/uL (ref 0.0–0.7)
Eosinophils Relative: 2.4 % (ref 0.0–5.0)
HCT: 42.2 % (ref 36.0–46.0)
Hemoglobin: 14.2 g/dL (ref 12.0–15.0)
Lymphocytes Relative: 26.9 % (ref 12.0–46.0)
Lymphs Abs: 1.3 10*3/uL (ref 0.7–4.0)
MCHC: 33.6 g/dL (ref 30.0–36.0)
MCV: 88.3 fl (ref 78.0–100.0)
Monocytes Absolute: 0.5 10*3/uL (ref 0.1–1.0)
Monocytes Relative: 10.2 % (ref 3.0–12.0)
Neutro Abs: 2.8 10*3/uL (ref 1.4–7.7)
Neutrophils Relative %: 59.8 % (ref 43.0–77.0)
Platelets: 267 10*3/uL (ref 150.0–400.0)
RBC: 4.78 Mil/uL (ref 3.87–5.11)
RDW: 13.2 % (ref 11.5–15.5)
WBC: 4.7 10*3/uL (ref 4.0–10.5)

## 2020-10-08 LAB — HEMOGLOBIN A1C: Hgb A1c MFr Bld: 6.2 % (ref 4.6–6.5)

## 2020-10-08 MED ORDER — AZILSARTAN-CHLORTHALIDONE 40-12.5 MG PO TABS: 1.0000 | ORAL_TABLET | Freq: Every morning | ORAL | 3 refills | Status: DC

## 2020-10-08 MED ORDER — HYDRALAZINE HCL 50 MG PO TABS: 50.0000 mg | ORAL_TABLET | Freq: Three times a day (TID) | ORAL | 3 refills | Status: DC

## 2020-10-08 MED ORDER — SITAGLIPTIN PHOSPHATE 25 MG PO TABS: 25.0000 mg | ORAL_TABLET | Freq: Every day | ORAL | 3 refills | Status: DC

## 2020-10-08 NOTE — Patient Instructions (Addendum)
Results for Marissa King, Marissa King (MRN [id number]) as of 10/08/2020 11:48  Ref. Range 06/11/2020 07:19  Hemoglobin A1C Latest Ref Range: 4.6 - 6.5 % 6.3   FINDINGS:   Intracranial internal carotid arteries: No significant stenosis. No aneurysm.   Anterior cerebral arteries: No significant stenosis. No aneurysm.   Middle cerebral arteries: No significant stenosis. No aneurysm.   Posterior cerebral arteries: No significant stenosis. No aneurysm.   Basilar artery: No significant stenosis. No aneurysm.   Distal vertebral arteries: No significant stenosis. No aneurysm. The right vertebral artery is dominant.    IMPRESSION: Normal MRA of the head without contrast.   Electronically Signed by: Marissa King Exam End: 07/27/20 11:27 AM   Specimen Collected: 07/27/20 4:20 PM Last Resulted: 07/27/20 4:28 PM  Received From: [facility]:  No evidence of significant carotid bifurcation disease. No stenosis  on the left. 30% stenosis of the proximal ICA on the right.   Possible 30% left vertebral artery origin stenosis. No flow limiting  stenosis identified.   Large and medium sized intracranial vessels are patent without  proximal stenosis or occlusion. More distal branch vessels do show  some minimal atherosclerotic irregularity    Electronically Signed  By: Marissa King M.D.  On: 05/27/2019 10:23    HISTORY OF PRESENT ILLNESSES Marissa King is a 75 y.o. female who presents to the clinic today for retinal evaluation of her CRAO OD per Dr. Rex King. She has a hx of acute vision loss OD in 05/2019 and saw Dr. Rex King who noted CRAO OD. Had sed rate and CRP that was elevated, subsequently put on steroids. She had temporal artery biopsy that was negative for GCA. She has a hx of DM, HTN, and stroke.   Today, the patient reports acute vision loss OD. She denies pain or other ocular concerns at this time.  No aggravating or relieving factors.    ASSESSMENT  1. CRAO  (central retinal artery occlusion), right Macula OCT - OU - Both Eyes  Fundus Photography - OU - Both Eyes  Ambulatory Referral To Ophthalmology  2. Combined forms of age-related cataract of both eyes  3. 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 Hx of acute vision loss in 05/2019. Elevated ESR and CRP with Biopsy negative for GCA. Discussed that her symptoms are due to CRAO with poor visual potential. Discussed risk for neovascular glaucoma development. No evidence of glaucoma today. Discussed the need of cardiac echo and carotid US to rule out embolic phenomenon. Will contact her primary care physician to obtain these studies.   Can't return here for follow up due to distance involved so suggest she see Dr. Bradley King and [facility] group in three months to monitor for NVG. She also wants her cataract removed from the left eye post covid.  The symptoms of cataract, surgical options, and treatments and risks were discussed with patient. Monitor until retina is stabilized. Likely no improvement OD due to CRAO.   DM w/o DR. Maintain good glycemic control   PLAN  Obtained OCT and optos today Maintain good glycemic control  Continue follow up with Dr. Rex King, will send letter  Will send letter to PCP Return if symptoms worsen or fail to improve.  Patient Instructions  Continue to see Dr Marissa King in [place] should see Marissa Organ McLean-Scocuzza, MD for a carotid ultrasound and an echocardiogram  Continue diabetic medications and follow up with family doctor and/or Endocrinologist Maintain good  diabetic control, blood pressure control, serum lipid control Notify of significant change in vision Refrain from tobacco products    Albesa Seen. Cordelia Pen MD, FACS Matthew Saras Professor and [facility] of Ophthalmology [facility]  Abbreviations DM diabetes mellitus; DME diabetic macular edema; PDR proliferative diabetic  retinopathy; NPDR non proliferative diabetic retinopathy; Ex AMD exudative age related macular degeneration; Dry AMD Dry age related macular degeneration; BRVO Branch retinal vein occlusion; CRVO central retinal vein occlusion; ME macular edema; RD retinal detachment; RT retinal tear; SB scleral buckle; PPV pars plana vitrecctomy; OAG Open angle glaucoma; CE IOL cataract extraction with intraocular lens; PC IOL posterior chamber intraocular lens; VH Vitreous hemorrhage; PRP panretinal laser photocoagulation; AC IOL anterior chamber intraocular lens IVK intravitreal kenalog; IVA intravitreal avastin; IVL intravitreal Lucentis; IVE Intravitreal Eylea; MFC multifocal choroiditis; PVD posterior vitreous detachment; OD right eye; OS left eye; OU Both eyes; VMT vitreomacular traction; MI Myocardial infarction;  COPD Chronic obstructive pulmonary disease, CRF Chronic renal failure; HTN Hypertension; MH Macular hole; ERM epiretinal membrane; NVD neovascularization of the disc,; NVE neovascularization elsewhere; AREDS age related eye disease study  This document serves as a record of services personally performed by Albesa Seen. Cordelia Pen, MD. It was created on their behalf by Lessie Dings, a trained medical scribe. The creation of this record is the provider's dictation and/or activities during the visit.    Electronically signed by: Rella Larve, MD 07/12/19 1520    Cooking With Less Salt Cooking with less salt is one way to reduce the amount of sodium you get from food. Sodium is one of the elements that make up salt. It is found naturally in foods and is also added to certain foods. Depending on your condition and overall health, your health care provider or dietitian may recommend that you reduce your sodium intake. Most people should have less than 2,300 milligrams (mg) of sodium each day. If you have high blood pressure (hypertension), you may need to limit your sodium to 1,500 mg each day. Follow the  tips below to help reduce your sodium intake. What are tips for eating less sodium? Reading food labels  Check the food label before buying or using packaged ingredients. Always check the label for the serving size and sodium content.  Look for products with no more than 140 mg of sodium in one serving.  Check the % Daily Value column to see what percent of the daily recommended amount of sodium is provided in one serving of the product. Foods with 5% or less in this column are considered low in sodium. Foods with 20% or higher are considered high in sodium.  Do not choose foods with salt as one of the first three ingredients on the ingredients list. If salt is one of the first three ingredients, it usually means the item is high in sodium.   Shopping  Buy sodium-free or low-sodium products. Look for the following words on food labels: ? Low-sodium. ? Sodium-free. ? Reduced-sodium. ? No salt added. ? Unsalted.  Always check the sodium content even if foods are labeled as low-sodium or no salt added.  Buy fresh foods. Cooking  Use herbs, seasonings without salt, and spices as substitutes for salt.  Use sodium-free baking soda when baking.  Grill, braise, or roast foods to add flavor with less salt.  Avoid adding salt to pasta, rice, or hot cereals.  Drain and rinse canned vegetables, beans, and meat before use.  Avoid adding  salt when cooking sweets and desserts.  Cook with low-sodium ingredients. What foods are high in sodium? Vegetables Regular canned vegetables (not low-sodium or reduced-sodium). Sauerkraut, pickled vegetables, and relishes. Olives. [place] fries. Onion rings. Regular canned tomato sauce and paste. Regular tomato and vegetable juice. Frozen vegetables in sauces. Grains Instant hot cereals. Bread stuffing, pancake, and biscuit mixes. Croutons. Seasoned rice or pasta mixes. Noodle soup cups. Boxed or frozen macaroni and cheese. Regular salted crackers.  Self-rising flour. Rolls. Bagels. Flour tortillas and wraps. Meats and other proteins Meat or Marissa that is salted, canned, smoked, cured, spiced, or pickled. This includes bacon, ham, sausages, hot dogs, corned beef, chipped beef, meat loaves, salt pork, jerky, pickled herring, anchovies, regular canned tuna, and sardines. Salted nuts. Dairy Processed cheese and cheese spreads. Cheese curds. Blue cheese. Feta cheese. String cheese. Regular cottage cheese. Buttermilk. Canned milk. The items listed above may not be a complete list of foods high in sodium. Actual amounts of sodium may be different depending on processing. Contact a dietitian for more information. What foods are low in sodium? Fruits Fresh, frozen, or canned fruit with no sauce added. Fruit juice. Vegetables Fresh or frozen vegetables with no sauce added. "No salt added" canned vegetables. "No salt added" tomato sauce and paste. Low-sodium or reduced-sodium tomato and vegetable juice. Grains Noodles, pasta, quinoa, rice. Shredded or puffed wheat or puffed rice. Regular or quick oats (not instant). Low-sodium crackers. Low-sodium bread. Whole-grain bread and whole-grain pasta. Unsalted popcorn. Meats and other proteins Fresh or frozen whole meats, poultry (not injected with sodium), and Marissa with no sauce added. Unsalted nuts. Dried peas, beans, and lentils without added salt. Unsalted canned beans. Eggs. Unsalted nut butters. Low-sodium canned tuna or chicken. Dairy Milk. Soy milk. Yogurt. Low-sodium cheeses, such as Swiss, Monte[place] Jack, [place], and [employer]. Sherbet or ice cream (keep to  cup per serving). Cream cheese. Fats and oils Unsalted butter or margarine. Other foods Homemade pudding. Sodium-free baking soda and baking powder. Herbs and spices. Low-sodium seasoning mixes. Beverages Coffee and tea. Carbonated beverages. The items listed above may not be a complete list of foods low in sodium. Actual amounts of sodium  may be different depending on processing. Contact a dietitian for more information. What are some salt alternatives when cooking? The following are herbs, seasonings, and spices that can be used instead of salt to flavor your food. Herbs should be fresh or dried. Do not choose packaged mixes. Next to the name of the herb, spice, or seasoning are some examples of foods you can pair it with. Herbs  Bay leaves - Soups, meat and vegetable dishes, and spaghetti sauce.  Basil - [employer], soups, pasta, and Marissa dishes.  Cilantro - Meat, poultry, and vegetable dishes.  Chili powder - Marinades and Mexican dishes.  Chives - Salad dressings and potato dishes.  Cumin - Mexican dishes, couscous, and meat dishes.  Dill - Marissa dishes, sauces, and salads.  Fennel - Meat and vegetable dishes, breads, and cookies.  Garlic (do not use garlic salt) - [place] dishes, meat dishes, salad dressings, and sauces.  Marjoram - Soups, potato dishes, and meat dishes.  Oregano - Pizza and spaghetti sauce.  Parsley - Salads, soups, pasta, and meat dishes.  Rosemary - [place] dishes, salad dressings, soups, and red meats.  Saffron - Marissa dishes, pasta, and some poultry dishes.  Sage - Stuffings and sauces.  Tarragon - Marissa and [employer].  Thyme - Stuffing, meat, and Marissa dishes. Seasonings  Lemon juice - Marissa dishes, poultry dishes, vegetables, and salads.  Vinegar - Salad dressings, vegetables, and Marissa dishes. Spices  Cinnamon - Sweet dishes, such as cakes, cookies, and puddings.  Cloves - Gingerbread, puddings, and marinades for meats.  Curry - Vegetable dishes, Marissa and poultry dishes, and stir-fry dishes.  Marissa - Vegetable dishes, Marissa dishes, and stir-fry dishes.  Nutmeg - Pasta, vegetables, poultry, Marissa dishes, and custard. Summary  Cooking with less salt is one way to reduce the amount of sodium that you get from food.  Buy sodium-free or low-sodium products.  Check  the food label before using or buying packaged ingredients.  Use herbs, seasonings without salt, and spices as substitutes for salt in foods. This information is not intended to replace advice given to you by your health care provider. Make sure you discuss any questions you have with your health care provider. Document Revised: 07/10/2019 Document Reviewed: 07/10/2019 Elsevier Patient Education  2021 [place] Eating Plan DASH stands for Dietary Approaches to Stop Hypertension. The DASH eating plan is a healthy eating plan that has been shown to:  Reduce high blood pressure (hypertension).  Reduce your risk for type 2 diabetes, heart disease, and stroke.  Help with weight loss. What are tips for following this plan? Reading food labels  Check food labels for the amount of salt (sodium) per serving. Choose foods with less than 5 percent of the Daily Value of sodium. Generally, foods with less than 300 milligrams (mg) of sodium per serving fit into this eating plan.  To find whole grains, look for the word "whole" as the first word in the ingredient list. Shopping  Buy products labeled as "low-sodium" or "no salt added."  Buy fresh foods. Avoid canned foods and pre-made or frozen meals. Cooking  Avoid adding salt when cooking. Use salt-free seasonings or herbs instead of table salt or sea salt. Check with your health care provider or pharmacist before using salt substitutes.  Do not fry foods. Cook foods using healthy methods such as baking, boiling, grilling, roasting, and broiling instead.  Cook with heart-healthy oils, such as olive, canola, avocado, soybean, or sunflower oil. Meal planning  Eat a balanced diet that includes: ? 4 or more servings of fruits and 4 or more servings of vegetables each day. Try to fill one-half of your plate with fruits and vegetables. ? 6-8 servings of whole grains each day. ? Less than 6 oz (170 g) of lean meat, poultry, or Marissa each  day. A 3-oz (85-g) serving of meat is about the same size as a deck of cards. One egg equals 1 oz (28 g). ? 2-3 servings of low-fat dairy each day. One serving is 1 cup (237 mL). ? 1 serving of nuts, seeds, or beans 5 times each week. ? 2-3 servings of heart-healthy fats. Healthy fats called omega-3 fatty acids are found in foods such as walnuts, flaxseeds, fortified milks, and eggs. These fats are also found in cold-water Marissa, such as sardines, salmon, and mackerel.  Limit how much you eat of: ? Canned or prepackaged foods. ? Food that is high in trans fat, such as some fried foods. ? Food that is high in saturated fat, such as fatty meat. ? Desserts and other sweets, sugary drinks, and other foods with added sugar. ? Full-fat dairy products.  Do not salt foods before eating.  Do not eat more than 4 egg yolks a week.  Try to eat at least 2 vegetarian  meals a week.  Eat more home-cooked food and less restaurant, buffet, and fast food.   Lifestyle  When eating at a restaurant, ask that your food be prepared with less salt or no salt, if possible.  If you drink alcohol: ? Limit how much you use to:  0-1 drink a day for women who are not pregnant.  0-2 drinks a day for men. ? Be aware of how much alcohol is in your drink. In the [place] one drink equals one 12 oz bottle of beer (355 mL), one 5 oz glass of wine (148 mL), or one 1 oz glass of hard liquor (44 mL). General information  Avoid eating more than 2,300 mg of salt a day. If you have hypertension, you may need to reduce your sodium intake to 1,500 mg a day.  Work with your health care provider to maintain a healthy body weight or to lose weight. Ask what an ideal weight is for you.  Get at least 30 minutes of exercise that causes your heart to beat faster (aerobic exercise) most days of the week. Activities may include walking, swimming, or biking.  Work with your health care provider or dietitian to adjust your eating plan  to your individual calorie needs. What foods should I eat? Fruits All fresh, dried, or frozen fruit. Canned fruit in natural juice (without added sugar). Vegetables Fresh or frozen vegetables (raw, steamed, roasted, or grilled). Low-sodium or reduced-sodium tomato and vegetable juice. Low-sodium or reduced-sodium tomato sauce and tomato paste. Low-sodium or reduced-sodium canned vegetables. Grains Whole-grain or whole-wheat bread. Whole-grain or whole-wheat pasta. Brown rice. [place]. Bulgur. Whole-grain and low-sodium cereals. Pita bread. Low-fat, low-sodium crackers. Whole-wheat flour tortillas. Meats and other proteins Skinless chicken or [place]. Ground chicken or [place]. Pork with fat trimmed off. Marissa and seafood. Egg whites. Dried beans, peas, or lentils. Unsalted nuts, nut butters, and seeds. Unsalted canned beans. Lean cuts of beef with fat trimmed off. Low-sodium, lean precooked or cured meat, such as sausages or meat loaves. Dairy Low-fat (1%) or fat-free (skim) milk. Reduced-fat, low-fat, or fat-free cheeses. Nonfat, low-sodium ricotta or cottage cheese. Low-fat or nonfat yogurt. Low-fat, low-sodium cheese. Fats and oils Soft margarine without trans fats. Vegetable oil. Reduced-fat, low-fat, or light mayonnaise and salad dressings (reduced-sodium). Canola, safflower, olive, avocado, soybean, and sunflower oils. Avocado. Seasonings and condiments Herbs. Spices. Seasoning mixes without salt. Other foods Unsalted popcorn and pretzels. Fat-free sweets. The items listed above may not be a complete list of foods and beverages you can eat. Contact a dietitian for more information. What foods should I avoid? Fruits Canned fruit in a light or heavy syrup. Fried fruit. Fruit in cream or butter sauce. Vegetables Creamed or fried vegetables. Vegetables in a cheese sauce. Regular canned vegetables (not low-sodium or reduced-sodium). Regular canned tomato sauce and paste (not low-sodium  or reduced-sodium). Regular tomato and vegetable juice (not low-sodium or reduced-sodium). Angie Fava. Olives. Grains Baked goods made with fat, such as croissants, muffins, or some breads. Dry pasta or rice meal packs. Meats and other proteins Fatty cuts of meat. Ribs. Fried meat. Berniece Salines. Bologna, salami, and other precooked or cured meats, such as sausages or meat loaves. Fat from the back of a pig (fatback). Bratwurst. Salted nuts and seeds. Canned beans with added salt. Canned or smoked Marissa. Whole eggs or egg yolks. Chicken or [place] with skin. Dairy Whole or 2% milk, cream, and half-and-half. Whole or full-fat cream cheese. Whole-fat or sweetened yogurt. Full-fat cheese. Nondairy creamers. Whipped  toppings. Processed cheese and cheese spreads. Fats and oils Butter. Stick margarine. Lard. Shortening. Ghee. Bacon fat. Tropical oils, such as coconut, palm kernel, or palm oil. Seasonings and condiments Onion salt, garlic salt, seasoned salt, table salt, and sea salt. Worcestershire sauce. Tartar sauce. Barbecue sauce. Teriyaki sauce. Soy sauce, including reduced-sodium. Steak sauce. Canned and packaged gravies. Marissa sauce. Oyster sauce. Cocktail sauce. Store-bought horseradish. Ketchup. Mustard. Meat flavorings and tenderizers. Bouillon cubes. Hot sauces. Pre-made or packaged marinades. Pre-made or packaged taco seasonings. Relishes. Regular salad dressings. Other foods Salted popcorn and pretzels. The items listed above may not be a complete list of foods and beverages you should avoid. Contact a dietitian for more information. Where to find more information  National Heart, Lung, and Blood Institute: https://wilson-eaton.com/  American Heart Association: www.heart.org  Academy of Nutrition and Dietetics: www.eatright.[place]: www.kidney.org Summary  The DASH eating plan is a healthy eating plan that has been shown to reduce high blood pressure (hypertension). It may also  reduce your risk for type 2 diabetes, heart disease, and stroke.  When on the DASH eating plan, aim to eat more fresh fruits and vegetables, whole grains, lean proteins, low-fat dairy, and heart-healthy fats.  With the DASH eating plan, you should limit salt (sodium) intake to 2,300 mg a day. If you have hypertension, you may need to reduce your sodium intake to 1,500 mg a day.  Work with your health care provider or dietitian to adjust your eating plan to your individual calorie needs. This information is not intended to replace advice given to you by your health care provider. Make sure you discuss any questions you have with your health care provider. Document Revised: 06/21/2019 Document Reviewed: 06/21/2019 Elsevier Patient Education  2021 [employer].

## 2020-10-08 NOTE — Progress Notes (Signed)
Chief Complaint  Patient presents with  . Follow-up   F/u  1. htn controlled on norvasc 2.5 taking 1/2 pill in am and pm, azilsartan-chlorathlidone 40-12.5 mg qam 9 am, hydralazine 50 mg tid BP was 180s-190s BP at home 125/75 to 140s/69 at night sometimes sbp 150s when forgets 2 pm dose of hydralazine but #s better  2. Thyroid nodules and 11/02/20 appt Dr. Buddy Duty 3 b/l cataracts appt 10/16/20 Dr. Wyatt Portela surgery  4. Pt wants repeat labs today    Review of Systems  Constitutional: Negative for weight loss.  HENT: Negative for hearing loss.   Eyes: Positive for blurred vision.  Respiratory: Negative for shortness of breath.   Cardiovascular: Negative for chest pain.  Gastrointestinal: Negative for abdominal pain.  Musculoskeletal: Negative for falls and joint pain.  Skin: Negative for rash.  Neurological: Negative for dizziness.  Psychiatric/Behavioral: Negative for depression.   Past Medical History:  Diagnosis Date  . Allergy   . 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 Asthma    with h/o chronic bronchitis  . Back pain    mid back pain h/ o trauma   . Diabetes mellitus without complication ([place])   . Frequent headaches   . Gastric ulcer   . GERD (gastroesophageal reflux disease)   . History of chicken pox   . Hyperlipidemia   . Hypertension   . Migraines   . Thyroid disease    mng    Past Surgical History:  Procedure Laterality Date  . ABDOMINAL HYSTERECTOMY     DUB 1 ovary intact   . CHOLECYSTECTOMY     09/02/19   Family History  Problem Relation Age of Onset  . Alcohol abuse Mother   . Heart disease Mother        CHF  . Hypertension Mother   . Hyperlipidemia Mother   . Stroke Mother   . Mental illness Mother   . Alcohol abuse Father   . Cancer Father        pancreatitic   . Arthritis Sister   . COPD Sister   . Depression Sister   . Drug abuse Sister   . Hearing loss Sister   . Heart disease Sister        CHF  . Hyperlipidemia Sister    . Hypertension Sister   . Mental illness Sister   . COPD Brother   . Depression Brother   . Heart disease Brother   . Hyperlipidemia Brother   . Stroke Brother   . Asthma Son   . Arthritis Brother   . Depression Brother   . Heart disease Brother   . Hyperlipidemia Brother   . Mental illness Brother   . Mental illness Brother   . Hyperlipidemia Brother   . Drug abuse Brother   . Diabetes Brother   . Depression Brother   . COPD Brother   . Birth defects Brother   . Arthritis Brother    Social History   Socioeconomic History  . Marital status: Widowed    Spouse name: Not on file  . Number of children: Not on file  . Years of education: Not on file  . Highest education level: Not on file  Occupational History  . Not on file  Tobacco Use  . Smoking status: Never Smoker  . Smokeless tobacco: Never Used  Substance and Sexual Activity  . Alcohol use: No  . Drug use: No  . Sexual activity: Not  Currently  Other Topics Concern  . Not on file  Social History Narrative   2 sons, 4 pregnancies    Brother is [name]    Never smoker exposed 2nd hand    No guns    Wears seat belt    Safe in relationship, widowed lives alone   2 year college ed    Social Determinants of Health   Financial Resource Strain: Low Risk   . Difficulty of Paying Living Expenses: Not hard at all  Food Insecurity: No Food Insecurity  . Worried About Charity fundraiser in the Last Year: Never true  . Ran Out of Food in the Last Year: Never true  Transportation Needs: No Transportation Needs  . Lack of Transportation (Medical): No  . Lack of Transportation (Non-Medical): No  Physical Activity: Not on file  Stress: No Stress Concern Present  . Feeling of Stress : Not at all  Social Connections: Unknown  . Frequency of Communication with Friends and Family: More than three times a week  . Frequency of Social Gatherings with Friends and Family: Not on file  . Attends Religious Services:  Not on file  . Active Member of Clubs or Organizations: Not on file  . Attends Archivist Meetings: Not on file  . Marital Status: Not on file  Intimate Partner Violence: Not on file   Current Meds  Medication Sig  . albuterol (PROVENTIL HFA;VENTOLIN HFA) 108 (90 Base) MCG/ACT inhaler Inhale into the lungs.   Marland Kitchen amLODipine (NORVASC) 2.5 MG tablet Take 1 tablet (2.5 mg total) by mouth daily.  Marland Kitchen aspirin EC 81 MG tablet Take 81 mg by mouth daily.  . Azilsartan-Chlorthalidone 40-12.5 MG TABS Take 1 tablet by mouth every morning. For blood  . budesonide-formoterol (SYMBICORT) 160-4.5 MCG/ACT inhaler Inhale 2 puffs into the lungs 2 (two) times daily. Rinse mouth for asthma  . glucose blood test strip Check sugars 1-2 times daily. 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 hydrALAZINE (APRESOLINE) 50 MG tablet Take 1 tablet (50 mg total) by mouth 3 (three) times daily.  . Insulin Pen Needle (PEN NEEDLES) 30G X 8 MM MISC 1 Device by Does not apply route every 14 (fourteen) days. For repatha or preferred pen needle dispense  . ipratropium (ATROVENT) 0.06 % nasal spray Place 2 sprays into the nose 4 (four) times daily.  Marland Kitchen loratadine (CLARITIN) 10 MG tablet Take 1 tablet (10 mg total) by mouth daily as needed for allergies.  . Multiple Vitamins-Minerals (MULTIVITAMIN WOMENS 50+ ADV PO) Take by mouth.  Glory Rosebush DELICA LANCETS FINE MISC USE TO CHECK BLOOD SUGAR 1-2 TIMES DAILY  . Polyethyl Glycol-Propyl Glycol (SYSTANE FREE OP) Apply 1 drop to eye daily.  Marland Kitchen REPATHA SURECLICK 408 MG/ML SOAJ INJECT 140 MG INTO THE SKIN EVERY 14 (FOURTEEN) DAYS.  Marland Kitchen sitaGLIPtin (JANUVIA) 25 MG tablet Take 1 tablet (25 mg total) by mouth daily.  Marland Kitchen VITAMIN D PO Take 5,000 Units by mouth daily.    Allergies  Allergen Reactions  . Adhesive  [Tape] Hives    Tape glue  . Alendronate Other (See Comments)    Stabbing pain in breast  . Alprazolam Other (See Comments)    Made anxious  . Amlodipine Other (See Comments)     Hives, [place] out, Black spots on face, Did not keep blood pressure down.   . Atorvastatin Other (See Comments)  . Benzalkonium Chloride Other (See Comments)    Made skin blister   . Clindamycin Other (  See Comments)    Pt states that pill that is blue and red she cannot take due feeling like she was on edge  . Clonazepam Other (See Comments)    Headache   . Clonidine Other (See Comments)    Hurt chest    . Escitalopram Oxalate Other (See Comments)    Made anxious    . Gabapentin Other (See Comments)    A bad headache    . Hydrocodone-Acetaminophen Other (See Comments)    Made pain worse  . Iodinated Diagnostic Agents Swelling  . Iodine Other (See Comments)    Allergic to shell fish and x-ray dye   . Labetalol Other (See Comments)    Felt like veins are on fire   . Levofloxacin Other (See Comments)    Made me shake  . Lisinopril-Hydrochlorothiazide Other (See Comments)    Was in a blue and pale color   . Livalo [Pitavastatin]     Rash and nausea   . Lorazepam Other (See Comments)    Formula changed   . Losartan Potassium Other (See Comments)    Blood pressure    . Losartan Potassium-Hctz Other (See Comments)    Could not feel arms  . Meloxicam Other (See Comments)    Fatal blood pressure problems will cause strokes   . Metformin Nausea And Vomiting  . Metoclopramide Other (See Comments)    No air was moving, anxious    . Oxcarbazepine Other (See Comments)    Felt like she was chocking    . Penicillins   . Plavix [Clopidogrel]     Arms and legs like going to break in 1/2 per pt   . Potassium Citrate   . Pravastatin Other (See Comments)  . Rosuvastatin Other (See Comments)  . Shellfish Allergy Swelling  . Simvastatin Other (See Comments)  . Sitagliptin Other (See Comments)    "Made toes feel weird"  . Sodium Citrate Other (See Comments)    Teeth broke off   . Spironolactone     Breast pain    . Sulfa Antibiotics Other (See Comments) and Itching  . Zetia  [Ezetimibe]     Muscle aches    No results found for this or any previous visit (from the past 2160 hour(s)). Objective  Body mass index is 32.13 kg/m. Wt Readings from Last 3 Encounters:  10/08/20 187 lb 3.2 oz (84.9 kg)  07/10/20 186 lb (84.4 kg)  07/09/20 185 lb 9.6 oz (84.2 kg)   Temp Readings from Last 3 Encounters:  10/08/20 97.6 F (36.4 C)  07/09/20 97.8 F (36.6 C) (Oral)  07/07/20 (!) 97.5 F (36.4 C) (Temporal)   BP Readings from Last 3 Encounters:  10/08/20 104/82  07/10/20 (!) 150/90  07/09/20 138/90   Pulse Readings from Last 3 Encounters:  10/08/20 79  07/10/20 76  07/09/20 77    Physical Exam Vitals and nursing note reviewed.  Constitutional:      Appearance: Normal appearance. She is well-developed and well-groomed. She is obese.  HENT:     Head: Normocephalic and atraumatic.  Eyes:     Conjunctiva/sclera: Conjunctivae normal.     Pupils: Pupils are equal, round, and reactive to light.  Cardiovascular:     Rate and Rhythm: Normal rate and regular rhythm.     Heart sounds: Normal heart sounds. No murmur heard.   Pulmonary:     Effort: Pulmonary effort is normal.     Breath sounds: Normal breath sounds.  Abdominal:  Tenderness: There is no abdominal tenderness.  Skin:    General: Skin is warm and dry.  Neurological:     General: No focal deficit present.     Mental Status: She is alert and oriented to person, place, and time. Mental status is at baseline.     Gait: Gait normal.  Psychiatric:        Attention and Perception: Attention and perception normal.        Mood and Affect: Mood and affect normal.        Speech: Speech normal.        Behavior: Behavior normal. Behavior is cooperative.        Thought Content: Thought content normal.        Cognition and Memory: Cognition and memory normal.        Judgment: Judgment normal.     Assessment  Plan  Essential hypertension improved reviewed BP log - Plan: hydrALAZINE (APRESOLINE)  50 MG tablet tid norvasc 2.5 taking 1/2 pill in am and pm, azilsartan-chlorathlidone 40-12.5 mg qam 9 am, hydralazine 50 mg tid  Comprehensive metabolic panel, Lipid panel, CBC w/Diff  Prediabetes - Plan: Hemoglobin A1c rec healthy diet and exercise   Thyroid nodule Pending appt Dr. Buddy Duty 11/02/20  Cataract of both eyes,right eye surgery 10/16/20 Dr. Wyatt Portela then left eye   HM Declines flu shot  utd prevnar and pna 23 RxTdap givenpreviously Declinesshingrix Pfizer 2/2 consider booster in future   LMP 07/1997 pap 12/09/14 neg pap neg HPV s/p hysterectomy no h/o abnormal pap 1 ovary intact ? Which one  Mammogramfat necrosis right breast 11/14/19and 10/09/18 normal Repeat 05/07/20 negativeGI breast center ordered 2022  Colonoscopy 05/18/16 tubular adenoma Dr. Jerene Pitch GI f/u in 5 years and FH colon cancer in dad  DEXA 05/31/16 osteopenia consider repeat in 3-5 years vit D normal85.5 07/07/17 05/07/20 osteopeniarec calcium and vitamin D  Hep C neg 07/07/17  Never smoker h/o 2nd hand exposure  US thyroid nodules/MNG 11/14/19ordered repeat due to abnormal  F/u [facility] endocrine Dr. Buddy Duty 11/02/20   rec healthy diet and exercise   Dr. Wyatt Portela eye care right cataract surgery pending as of 10/08/20 sch 10/16/20 right eye also needs left  Provider: Dr. Olivia Mackie McLean-Scocuzza-Internal Medicine

## 2020-10-09 LAB — URINALYSIS, ROUTINE W REFLEX MICROSCOPIC
Bilirubin, UA: NEGATIVE
Glucose, UA: NEGATIVE
Ketones, UA: NEGATIVE
Leukocytes,UA: NEGATIVE
Nitrite, UA: NEGATIVE
Protein,UA: NEGATIVE
RBC, UA: NEGATIVE
Specific Gravity, UA: 1.007 (ref 1.005–1.030)
Urobilinogen, Ur: 0.2 mg/dL (ref 0.2–1.0)
pH, UA: 8 — ABNORMAL HIGH (ref 5.0–7.5)

## 2020-10-13 DIAGNOSIS — H35031 Hypertensive retinopathy, right eye: Secondary | ICD-10-CM | POA: Diagnosis not present

## 2020-10-13 DIAGNOSIS — H25813 Combined forms of age-related cataract, bilateral: Secondary | ICD-10-CM | POA: Diagnosis not present

## 2020-10-16 DIAGNOSIS — H25811 Combined forms of age-related cataract, right eye: Secondary | ICD-10-CM | POA: Diagnosis not present

## 2020-11-02 DIAGNOSIS — E042 Nontoxic multinodular goiter: Secondary | ICD-10-CM | POA: Diagnosis not present

## 2020-11-02 DIAGNOSIS — R1310 Dysphagia, unspecified: Secondary | ICD-10-CM | POA: Diagnosis not present

## 2020-11-04 ENCOUNTER — Other Ambulatory Visit: Payer: Self-pay | Admitting: Internal Medicine

## 2020-11-04 DIAGNOSIS — E042 Nontoxic multinodular goiter: Secondary | ICD-10-CM

## 2020-11-18 ENCOUNTER — Other Ambulatory Visit (HOSPITAL_COMMUNITY)
Admission: RE | Admit: 2020-11-18 | Discharge: 2020-11-18 | Disposition: A | Discharge status: Home / Self Care | Payer: Medicare Other | Source: Ambulatory Visit | Attending: Internal Medicine | Admitting: Internal Medicine

## 2020-11-18 ENCOUNTER — Ambulatory Visit
Admission: RE | Admit: 2020-11-18 | Discharge: 2020-11-18 | Disposition: A | Discharge status: Home / Self Care | Payer: Medicare Other | Source: Ambulatory Visit | Attending: Internal Medicine | Admitting: Internal Medicine

## 2020-11-18 DIAGNOSIS — E042 Nontoxic multinodular goiter: Secondary | ICD-10-CM | POA: Insufficient documentation

## 2020-11-18 DIAGNOSIS — E041 Nontoxic single thyroid nodule: Secondary | ICD-10-CM | POA: Diagnosis not present

## 2020-11-19 LAB — CYTOLOGY - NON PAP

## 2020-11-28 DIAGNOSIS — E042 Nontoxic multinodular goiter: Secondary | ICD-10-CM | POA: Diagnosis not present

## 2020-12-09 DIAGNOSIS — H2512 Age-related nuclear cataract, left eye: Secondary | ICD-10-CM | POA: Diagnosis not present

## 2020-12-11 DIAGNOSIS — H25812 Combined forms of age-related cataract, left eye: Secondary | ICD-10-CM | POA: Diagnosis not present

## 2020-12-16 ENCOUNTER — Encounter (HOSPITAL_COMMUNITY): Payer: Self-pay

## 2020-12-20 DIAGNOSIS — Z03818 Encounter for observation for suspected exposure to other biological agents ruled out: Secondary | ICD-10-CM | POA: Diagnosis not present

## 2021-01-26 DIAGNOSIS — R1310 Dysphagia, unspecified: Secondary | ICD-10-CM | POA: Diagnosis not present

## 2021-01-26 DIAGNOSIS — E042 Nontoxic multinodular goiter: Secondary | ICD-10-CM | POA: Diagnosis not present

## 2021-02-25 ENCOUNTER — Other Ambulatory Visit: Payer: Self-pay

## 2021-02-25 ENCOUNTER — Encounter: Payer: Self-pay | Admitting: Internal Medicine

## 2021-02-25 ENCOUNTER — Ambulatory Visit (INDEPENDENT_AMBULATORY_CARE_PROVIDER_SITE_OTHER): Payer: Medicare Other | Admitting: Internal Medicine

## 2021-02-25 VITALS — BP 124/76 | HR 84 | Temp 97.7°F | Ht 64.0 in | Wt 191.8 lb

## 2021-02-25 DIAGNOSIS — Z1211 Encounter for screening for malignant neoplasm of colon: Secondary | ICD-10-CM

## 2021-02-25 DIAGNOSIS — I6529 Occlusion and stenosis of unspecified carotid artery: Secondary | ICD-10-CM | POA: Diagnosis not present

## 2021-02-25 DIAGNOSIS — E1159 Type 2 diabetes mellitus with other circulatory complications: Secondary | ICD-10-CM | POA: Diagnosis not present

## 2021-02-25 DIAGNOSIS — I152 Hypertension secondary to endocrine disorders: Secondary | ICD-10-CM

## 2021-02-25 DIAGNOSIS — G720 Drug-induced myopathy: Secondary | ICD-10-CM

## 2021-02-25 DIAGNOSIS — R1310 Dysphagia, unspecified: Secondary | ICD-10-CM | POA: Diagnosis not present

## 2021-02-25 DIAGNOSIS — E7849 Other hyperlipidemia: Secondary | ICD-10-CM | POA: Diagnosis not present

## 2021-02-25 DIAGNOSIS — E785 Hyperlipidemia, unspecified: Secondary | ICD-10-CM

## 2021-02-25 DIAGNOSIS — T466X5A Adverse effect of antihyperlipidemic and antiarteriosclerotic drugs, initial encounter: Secondary | ICD-10-CM

## 2021-02-25 DIAGNOSIS — E049 Nontoxic goiter, unspecified: Secondary | ICD-10-CM | POA: Diagnosis not present

## 2021-02-25 DIAGNOSIS — I10 Essential (primary) hypertension: Secondary | ICD-10-CM | POA: Diagnosis not present

## 2021-02-25 DIAGNOSIS — Z0000 Encounter for general adult medical examination without abnormal findings: Secondary | ICD-10-CM | POA: Insufficient documentation

## 2021-02-25 LAB — COMPREHENSIVE METABOLIC PANEL
ALT: 13 U/L (ref 0–35)
AST: 16 U/L (ref 0–37)
Albumin: 3.9 g/dL (ref 3.5–5.2)
Alkaline Phosphatase: 77 U/L (ref 39–117)
BUN: 11 mg/dL (ref 6–23)
CO2: 28 mEq/L (ref 19–32)
Calcium: 9.4 mg/dL (ref 8.4–10.5)
Chloride: 94 mEq/L — ABNORMAL LOW (ref 96–112)
Creatinine, Ser: 0.91 mg/dL (ref 0.40–1.20)
GFR: 62.31 mL/min (ref >60.00)
Glucose, Bld: 96 mg/dL (ref 70–99)
Potassium: 4.2 mEq/L (ref 3.5–5.1)
Sodium: 131 mEq/L — ABNORMAL LOW (ref 135–145)
Total Bilirubin: 0.5 mg/dL (ref 0.2–1.2)
Total Protein: 7 g/dL (ref 6.0–8.3)

## 2021-02-25 LAB — CBC WITH DIFFERENTIAL/PLATELET
Basophils Absolute: 0 10*3/uL (ref 0.0–0.1)
Basophils Relative: 0.7 % (ref 0.0–3.0)
Eosinophils Absolute: 0.1 10*3/uL (ref 0.0–0.7)
Eosinophils Relative: 1.9 % (ref 0.0–5.0)
HCT: 39.7 % (ref 36.0–46.0)
Hemoglobin: 13.4 g/dL (ref 12.0–15.0)
Lymphocytes Relative: 22.8 % (ref 12.0–46.0)
Lymphs Abs: 1.1 10*3/uL (ref 0.7–4.0)
MCHC: 33.7 g/dL (ref 30.0–36.0)
MCV: 86.6 fl (ref 78.0–100.0)
Monocytes Absolute: 0.5 10*3/uL (ref 0.1–1.0)
Monocytes Relative: 10.4 % (ref 3.0–12.0)
Neutro Abs: 3.1 10*3/uL (ref 1.4–7.7)
Neutrophils Relative %: 64.2 % (ref 43.0–77.0)
Platelets: 269 10*3/uL (ref 150.0–400.0)
RBC: 4.58 Mil/uL (ref 3.87–5.11)
RDW: 12.6 % (ref 11.5–15.5)
WBC: 4.9 10*3/uL (ref 4.0–10.5)

## 2021-02-25 LAB — LIPID PANEL
Cholesterol: 145 mg/dL (ref 0–200)
HDL: 53.4 mg/dL (ref >39.00)
LDL Cholesterol: 78 mg/dL (ref 0–99)
NonHDL: 91.59
Total CHOL/HDL Ratio: 3
Triglycerides: 66 mg/dL (ref 0.0–149.0)
VLDL: 13.2 mg/dL (ref 0.0–40.0)

## 2021-02-25 LAB — HEMOGLOBIN A1C: Hgb A1c MFr Bld: 6.3 % (ref 4.6–6.5)

## 2021-02-25 MED ORDER — REPATHA SURECLICK 140 MG/ML ~~LOC~~ SOAJ: SUBCUTANEOUS | 11 refills | Status: DC

## 2021-02-25 NOTE — Progress Notes (Signed)
Chief Complaint  Patient presents with   Follow-up   Annual  1. DM 2 with BP controlled on hydralazine 50 mg tid, norvasc 2.5 mg qd, azilsartan chlorthalidone 40-12.5 mg qd, januvia 25 mg qd  Cbg running 110-120 mg qd  2. Thyroid goiter painful pending appt with Dr. Harlow Asa surgery and seen by Dr. Buddy Duty endocrine    Review of Systems  Constitutional:  Negative for weight loss.  HENT:  Negative for hearing loss.   Eyes:  Negative for blurred vision.  Respiratory:  Negative for shortness of breath.   Cardiovascular:  Negative for chest pain.  Gastrointestinal:  Negative for abdominal pain.  Musculoskeletal:  Negative for falls and joint pain.  Skin:  Negative for rash.  Neurological:  Negative for headaches.  Psychiatric/Behavioral:  Negative for depression.   Past Medical History:  Diagnosis Date   Allergy    Arthritis    DDD cervical spine chronic neck pain    Asthma    with h/o chronic bronchitis   Back pain    mid back pain h/ o trauma    Diabetes mellitus without complication ([facility])    Frequent headaches    Gastric ulcer    GERD (gastroesophageal reflux disease)    History of chicken pox    Hyperlipidemia    Hypertension    Migraines    Thyroid disease    mng    Past Surgical History:  Procedure Laterality Date   ABDOMINAL HYSTERECTOMY     DUB 1 ovary intact    CATARACT EXTRACTION     10/2020 and 12/2020   CHOLECYSTECTOMY     09/02/19   Family History  Problem Relation Age of Onset   Alcohol abuse Mother    Heart disease Mother        CHF   Hypertension Mother    Hyperlipidemia Mother    Stroke Mother    Mental illness Mother    Alcohol abuse Father    Cancer Father        pancreatitic    Arthritis Sister    COPD Sister    Depression Sister    Drug abuse Sister    Hearing loss Sister    Heart disease Sister        CHF   Hyperlipidemia Sister    Hypertension Sister    Mental illness Sister    COPD Brother    Depression Brother    Heart disease  Brother    Hyperlipidemia Brother    Stroke Brother    Asthma Son    Arthritis Brother    Depression Brother    Heart disease Brother    Hyperlipidemia Brother    Mental illness Brother    Mental illness Brother    Hyperlipidemia Brother    Drug abuse Brother    Diabetes Brother    Depression Brother    COPD Brother    Birth defects Brother    Arthritis Brother    Social History   Socioeconomic History   Marital status: Widowed    Spouse name: Not on file   Number of children: Not on file   Years of education: Not on file   Highest education level: Not on file  Occupational History   Not on file  Tobacco Use   Smoking status: Never   Smokeless tobacco: Never  Substance and Sexual Activity   Alcohol use: No   Drug use: No   Sexual activity: Not Currently  Other Topics Concern  Not on file  Social History Narrative   2 sons, 4 pregnancies    Brother is Control and instrumentation engineer    Never smoker exposed 2nd hand    No guns    Wears seat belt    Safe in relationship, widowed lives alone   2 year college ed    Social Determinants of Health   Financial Resource Strain: Low Risk    Difficulty of Paying Living Expenses: Not hard at all  Food Insecurity: No Food Insecurity   Worried About Charity fundraiser in the Last Year: Never true   Arboriculturist in the Last Year: Never true  Transportation Needs: No Transportation Needs   Lack of Transportation (Medical): No   Lack of Transportation (Non-Medical): No  Physical Activity: Not on file  Stress: No Stress Concern Present   Feeling of Stress : Not at all  Social Connections: Unknown   Frequency of Communication with Friends and Family: More than three times a week   Frequency of Social Gatherings with Friends and Family: Not on file   Attends Religious Services: Not on file   Active Member of Clubs or Organizations: Not on file   Attends Archivist Meetings: Not on file   Marital Status: Not on file   Intimate Partner Violence: Not on file   Current Meds  Medication Sig   albuterol (PROVENTIL HFA;VENTOLIN HFA) 108 (90 Base) MCG/ACT inhaler Inhale into the lungs.    amLODipine (NORVASC) 2.5 MG tablet Take 1 tablet (2.5 mg total) by mouth daily.   aspirin EC 81 MG tablet Take 81 mg by mouth daily.   Azilsartan-Chlorthalidone 40-12.5 MG TABS Take 1 tablet by mouth every morning. For blood   budesonide-formoterol (SYMBICORT) 160-4.5 MCG/ACT inhaler Inhale 2 puffs into the lungs 2 (two) times daily. Rinse mouth for asthma   COLLAGEN PO Take by mouth.   glucose blood test strip Check sugars 1-2 times daily. 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   Insulin Pen Needle (PEN NEEDLES) 30G X 8 MM MISC 1 Device by Does not apply route every 14 (fourteen) days. For repatha or preferred pen needle dispense   ipratropium (ATROVENT) 0.06 % nasal spray Place 2 sprays into the nose 4 (four) times daily.   loratadine (CLARITIN) 10 MG tablet Take 1 tablet (10 mg total) by mouth daily as needed for allergies.   Multiple Vitamins-Minerals (MULTIVITAMIN WOMENS 50+ ADV PO) Take by mouth.   ONETOUCH DELICA LANCETS FINE MISC USE TO CHECK BLOOD SUGAR 1-2 TIMES DAILY   Polyethyl Glycol-Propyl Glycol (SYSTANE FREE OP) Apply 1 drop to eye daily.   sitaGLIPtin (JANUVIA) 25 MG tablet Take 1 tablet (25 mg total) by mouth daily.   VITAMIN D PO Take 5,000 Units by mouth daily.    [DISCONTINUED] REPATHA SURECLICK XX123456 MG/ML SOAJ INJECT 140 MG INTO THE SKIN EVERY 14 (FOURTEEN) DAYS.   Allergies  Allergen Reactions   Adhesive  [Tape] Hives    Tape glue   Alendronate Other (See Comments)    Stabbing pain in breast   Alprazolam Other (See Comments)    Made anxious   Amlodipine Other (See Comments)    Hives, Fell out, Black spots on face, Did not keep blood pressure down.    Atorvastatin Other (See Comments)   Benzalkonium Chloride  Other (See Comments)    Made skin blister    Clindamycin  Other (See Comments)    Pt states that pill that is blue and red she cannot take due feeling like she was on edge   Clonazepam Other (See Comments)    Headache    Clonidine Other (See Comments)    Hurt chest     Escitalopram Oxalate Other (See Comments)    Made anxious     Gabapentin Other (See Comments)    A bad headache     Hydrocodone-Acetaminophen Other (See Comments)    Made pain worse   Iodinated Diagnostic Agents Swelling   Iodine Other (See Comments)    Allergic to shell fish and x-ray dye    Labetalol Other (See Comments)    Felt like veins are on fire    Levofloxacin Other (See Comments)    Made me shake   Lisinopril-Hydrochlorothiazide Other (See Comments)    Was in a blue and pale color    Livalo [Pitavastatin]     Rash and nausea    Lorazepam Other (See Comments)    Formula changed    Losartan Potassium Other (See Comments)    Blood pressure     Losartan Potassium-Hctz Other (See Comments)    Could not feel arms   Meloxicam Other (See Comments)    Fatal blood pressure problems will cause strokes    Metformin Nausea And Vomiting   Metoclopramide Other (See Comments)    No air was moving, anxious     Oxcarbazepine Other (See Comments)    Felt like she was chocking     Penicillins    Plavix [Clopidogrel]     Arms and legs like going to break in 1/2 per pt    Potassium Citrate    Pravastatin Other (See Comments)   Rosuvastatin Other (See Comments)   Shellfish Allergy Swelling   Simvastatin Other (See Comments)   Sitagliptin Other (See Comments)    "Made toes feel weird"   Sodium Citrate Other (See Comments)    Teeth broke off    Spironolactone     Breast pain     Sulfa Antibiotics Other (See Comments) and Itching   Zetia [Ezetimibe]     Muscle aches    No results found for this or any previous visit (from the past 2160 hour(s)). Objective  Body mass index is 32.92 kg/m. Wt  Readings from Last 3 Encounters:  02/25/21 191 lb 12.8 oz (87 kg)  10/08/20 187 lb 3.2 oz (84.9 kg)  07/10/20 186 lb (84.4 kg)   Temp Readings from Last 3 Encounters:  02/25/21 97.7 F (36.5 C) (Temporal)  10/08/20 97.6 F (36.4 C)  07/09/20 97.8 F (36.6 C) (Oral)   BP Readings from Last 3 Encounters:  02/25/21 124/76  10/08/20 104/82  07/10/20 (!) 150/90   Pulse Readings from Last 3 Encounters:  02/25/21 84  10/08/20 79  07/10/20 76    Physical Exam Vitals and nursing note reviewed.  Constitutional:      Appearance: Normal appearance. She is well-developed and well-groomed. She is obese.  HENT:     Head: Normocephalic and atraumatic.  Eyes:     Conjunctiva/sclera: Conjunctivae normal.     Pupils: Pupils are equal, round, and reactive to light.  Cardiovascular:     Rate and Rhythm: Normal rate and regular rhythm.     Heart sounds: Normal heart sounds. No murmur heard. Pulmonary:     Effort: Pulmonary effort is normal.     Breath sounds: Normal breath sounds.  Skin:  General: Skin is warm and dry.  Neurological:     General: No focal deficit present.     Mental Status: She is alert and oriented to person, place, and time. Mental status is at baseline.     Gait: Gait normal.  Psychiatric:        Attention and Perception: Attention and perception normal.        Mood and Affect: Mood and affect normal.        Speech: Speech normal.        Behavior: Behavior normal. Behavior is cooperative.        Thought Content: Thought content normal.        Cognition and Memory: Cognition and memory normal.        Judgment: Judgment normal.    Assessment  Plan  Annual Declines flu shot utd prevnar and pna 23  Rx Tdap given previously per pt had 2017 [facility] get record  Declines shingrix Pfizer 2/2 consider booster in future declines   LMP 07/1997 pap 12/09/14 neg pap neg HPV s/p hysterectomy no h/o abnormal pap 1 ovary intact ? Which one   Mammogram fat  necrosis right breast 06/14/18 and 10/09/18 normal  Repeat 05/07/20 negative GI breast center ordered 2022   Colonoscopy 05/18/16 tubular adenoma Dr. Jerene Pitch GI f/u in 5 years and FH colon cancer in dad Referred [facility]   DEXA 05/31/16 osteopenia consider repeat in 3-5 years vit D normal 85.5 07/07/17   05/07/20 osteopenia rec calcium and vitamin D   Hep C neg 07/07/17 Never smoker h/o 2nd hand exposure   US thyroid nodules/MNG 06/14/18 ordered repeat due to abnormal F/u [facility] endocrine Dr. Buddy Duty 11/02/20    rec healthy diet and exercise      Dr. Wyatt Portela eye care right cataract surgery b/l spring 2022   Hypertension associated with diabetes ([place]) controlled- Plan: Comprehensive metabolic panel, Lipid panel, Hemoglobin A1c, CBC with Differential/Platelet, Evolocumab (REPATHA SURECLICK) XX123456 MG/ML SOAJ Controlled on meds ydralazine 50 mg tid, norvasc 2.5 mg qd, azilsartan chlorthalidone 40-12.5 mg qd, januvia 25 mg qd  Hyperlipidemia, unspecified hyperlipidemia type - Plan: Evolocumab (REPATHA SURECLICK) XX123456 MG/ML SOAJ Familial hyperlipidemia - Plan: Evolocumab (REPATHA SURECLICK) XX123456 MG/ML SOAJ Stenosis of carotid artery, unspecified laterality - Plan: Evolocumab (REPATHA SURECLICK) XX123456 MG/ML SOAJ  Thyroid goiter  F/u surgery Dr. Harlow Asa and Dr. Buddy Duty endocrine  Provider: Dr. Olivia Mackie McLean-Scocuzza-Internal Medicine

## 2021-02-25 NOTE — Patient Instructions (Addendum)
Dr. Armandina Gemma surgery for thyroid  [phone]   Am sugar 90-140 or less  Goal after meals <180   Consider getting glucose tablets    Colonoscopy due 05/18/21 let me know who you want want to see call for GI appointment  [phone] Not available [address]   [place] [address]      Specialties     Gastroenterology            Mammogram due 05/07/21   Hypoglycemia Hypoglycemia occurs when the level of sugar (glucose) in the blood is too low. Hypoglycemia can happen in people who have or do not have diabetes. It can develop quickly, and it can be a medical emergency. For most people, a blood glucose level below 70 mg/dL (3.9 mmol/L) is consideredhypoglycemia. Glucose is a type of sugar that provides the body's main source of energy. Certain hormones (insulin and glucagon) control the level of glucose in the blood. Insulin lowers blood glucose, and glucagon raises blood glucose. Hypoglycemia can result from having too much insulin in the bloodstream, or from not eating enough food that contains glucose. You may also have reactive hypoglycemia, which happens within 4 hoursafter eating a meal. What are the causes? Hypoglycemia occurs most often in people who have diabetes and may be caused by: Diabetes medicine. Not eating enough, or not eating often enough. Increased physical activity. Drinking alcohol on an empty stomach. If you do not have diabetes, hypoglycemia may be caused by: A tumor in the pancreas. Not eating enough, or not eating for long periods at a time (fasting). A severe infection or illness. Problems after having bariatric surgery. Organ failure, such as kidney or liver failure. Certain medicines. What increases the risk? Hypoglycemia is more likely to develop in people who: Have diabetes and take medicines to lower blood glucose. Abuse alcohol. Have a severe illness. What are the signs or symptoms? Symptoms vary depending on  whether the condition is mild, moderate, or severe. Mild hypoglycemia Hunger. Sweating and feeling clammy. Dizziness or feeling light-headed. Sleepiness or restless sleep. Nausea. Increased heart rate. Headache. Blurry vision. Mood changes, such as irritability or anxiety. Tingling or numbness around the mouth, lips, or tongue. Moderate hypoglycemia Confusion and poor judgment. Behavior changes. Weakness. Irregular heartbeat. A change in coordination. Severe hypoglycemia Severe hypoglycemia is a medical emergency. It can cause: Fainting. Seizures. Loss of consciousness (coma). Death. How is this diagnosed? Hypoglycemia is diagnosed with a blood test to measure your blood glucose level. This blood test is done while you are having symptoms. Your health careprovider may also do a physical exam and review your medical history. How is this treated? This condition can be treated by immediately eating or drinking something that contains sugar with 15 grams of fast-acting carbohydrate, such as: 4 oz (120 mL) of fruit juice. 4 oz (120 mL) of regular soda (not diet soda). Several pieces of hard candy. Check food labels to find out how many pieces to eat for 15 grams. 1 Tbsp (15 mL) of sugar or honey. 4 glucose tablets. 1 tube of glucose gel. Treating hypoglycemia if you have diabetes If you are alert and able to swallow safely, follow the 15:15 rule: Take 15 grams of a fast-acting carbohydrate. Talk with your health care provider about how much you should take. Options for getting 15 grams of fast-acting carbohydrate include: Glucose tablets (take 4 tablets). Several pieces of hard candy. Check food labels to find out how  many pieces to eat for 15 grams. 4 oz (120 mL) of fruit juice. 4 oz (120 mL) of regular soda (not diet soda). 1 Tbsp (15 mL) of sugar or honey. 1 tube of glucose gel. Check your blood glucose 15 minutes after you take the carbohydrate. If the repeat blood glucose  level is still at or below 70 mg/dL (3.9 mmol/L), take 15 grams of a carbohydrate again. If your blood glucose level does not increase above 70 mg/dL (3.9 mmol/L) after 3 tries, seek emergency medical care. After your blood glucose level returns to normal, eat a meal or a snack within 1 hour.  Treating severe hypoglycemia Severe hypoglycemia is when your blood glucose level is below 54 mg/dL (3 mmol/L). Severe hypoglycemia is a medical emergency. Get medical help right away. If you have severe hypoglycemia and you cannot eat or drink, you will need to be given glucagon. A family member or close friend should learn how to check your blood glucose and how to give you glucagon. Ask your health care providerif you need to have an emergency glucagon kit available. Severe hypoglycemia may need to be treated in a hospital. The treatment may include getting glucose through an IV. You may also need treatment for thecause of your hypoglycemia. Follow these instructions at home:  General instructions Take over-the-counter and prescription medicines only as told by your health care provider. Monitor your blood glucose as told by your health care provider. If you drink alcohol: Limit how much you have to: 0-1 drink a day for women who are not pregnant. 0-2 drinks a day for men. Know how much alcohol is in your drink. In the [place] one drink equals one 12 oz bottle of beer (355 mL), one 5 oz glass of wine (148 mL), or one 1 oz glass of hard liquor (44 mL). Be sure to eat food along with drinking alcohol. Be aware that alcohol is absorbed quickly and may have lingering effects that may result in hypoglycemia later. Be sure to do ongoing glucose monitoring. Keep all follow-up visits. This is important. If you have diabetes: Always have a fast-acting carbohydrate (15 grams) option with you to treat low blood glucose. Follow your diabetes management plan as directed by your health care provider. Make sure  you: Know the symptoms of hypoglycemia. It is important to treat it right away to prevent it from becoming severe. Check your blood glucose as often as told. Always check before and after exercise. Always check your blood glucose before you drive a motorized vehicle. Take your medicines as told. Follow your meal plan. Eat on time, and do not skip meals. Share your diabetes management plan with people in your workplace, school, and household. Carry a medical alert card or wear medical alert jewelry. Where to find more information American Diabetes Association: www.diabetes.org Contact a health care provider if: You have problems keeping your blood glucose in your target range. You have frequent episodes of hypoglycemia. Get help right away if: You continue to have hypoglycemia symptoms after eating or drinking something that contains 15 grams of fast-acting carbohydrate, and you cannot get your blood glucose above 70 mg/dL (3.9 mmol/L) while following the 15:15 rule. Your blood glucose is below 54 mg/dL (3 mmol/L). You have a seizure. You faint. These symptoms may represent a serious problem that is an emergency. Do not wait to see if the symptoms will go away. Get medical help right away. Call your local emergency services (911 in the [place] Do  not drive yourself to the hospital. Summary Hypoglycemia occurs when the level of sugar (glucose) in the blood is too low. Hypoglycemia can happen in people who have or do not have diabetes. It can develop quickly, and it can be a medical emergency. Make sure you know the symptoms of hypoglycemia and how to treat it. Always have a fast-acting carbohydrate option with you to treat low blood sugar. This information is not intended to replace advice given to you by your health care provider. Make sure you discuss any questions you have with your healthcare provider. Document Revised: 06/18/2020 Document Reviewed: 06/18/2020 Elsevier Patient Education   2022 [employer].

## 2021-03-01 ENCOUNTER — Telehealth: Payer: Self-pay | Admitting: Gastroenterology

## 2021-03-01 DIAGNOSIS — E1151 Type 2 diabetes mellitus with diabetic peripheral angiopathy without gangrene: Secondary | ICD-10-CM | POA: Diagnosis not present

## 2021-03-01 DIAGNOSIS — H3411 Central retinal artery occlusion, right eye: Secondary | ICD-10-CM | POA: Diagnosis not present

## 2021-03-01 NOTE — Telephone Encounter (Signed)
Hi Dr. Havery Moros,  We received a referral from PCP assigned to you for patient to have another colonoscopy. It looks like she had one done 5 years ago. Records are available for you to review via Epic.   Please advise on scheduling.   Thanks

## 2021-03-02 NOTE — Telephone Encounter (Signed)
Called patient to schedule left voicemail. 

## 2021-03-03 ENCOUNTER — Telehealth: Payer: Self-pay | Admitting: Internal Medicine

## 2021-03-03 NOTE — Telephone Encounter (Signed)
Pt would like a call back she wanted to discuss her colonoscopy and possible  a endoscopy

## 2021-03-04 ENCOUNTER — Encounter: Payer: Self-pay | Admitting: Internal Medicine

## 2021-03-04 NOTE — Telephone Encounter (Signed)
She is not due colonscopy until 05/2021 can have upper endoscopy at this time as well  Where did she want to go prior person or [facility] in [place]?

## 2021-03-04 NOTE — Telephone Encounter (Signed)
Patient states that she was needing an endoscopy added to the reasons for her referral to GI.  Referral placed 02/25/21 for a colonoscopy. Patient states she needs an endoscopy to check on her breathing and swallowing issues. She has had her esphagus stretched twice.   Please advise, Patient scheduled to be seen the end of August with GI. Do we need to change referral or fax over anything new?

## 2021-03-08 ENCOUNTER — Encounter: Payer: Self-pay | Admitting: Internal Medicine

## 2021-03-08 NOTE — Addendum Note (Signed)
Addended by: Orland Mustard on: 03/08/2021 09:53 PM   Modules accepted: Orders

## 2021-03-08 NOTE — Telephone Encounter (Signed)
Patient is returning your call from earlier. 

## 2021-03-08 NOTE — Telephone Encounter (Signed)
LMTCB

## 2021-03-08 NOTE — Telephone Encounter (Signed)
Spoke with pt and she stated that she is scheduled for a consultation on August 23rd with [facility]. Pt stated that she is needing a referral sent to them for both the colonoscopy and the endoscopy.

## 2021-03-08 NOTE — Telephone Encounter (Signed)
Done sent GI message as well  Dr. Gayland Curry

## 2021-03-08 NOTE — Telephone Encounter (Signed)
Patient is returning your call.  

## 2021-03-09 ENCOUNTER — Telehealth: Payer: Self-pay

## 2021-03-09 NOTE — Telephone Encounter (Signed)
Lm on vm for patient to return call to schedule appt.

## 2021-03-09 NOTE — Telephone Encounter (Signed)
-----   Message from Marissa Flock, MD sent at 03/09/2021 10:30 AM EDT ----- Marissa King I can't see who she is scheduled with but can you help book her an office visit with me or APP to discuss these issues, sounds like she will need EGD + colon. Thanks ----- Message ----- From: McLean-Scocuzza, Nino Glow, MD Sent: 03/08/2021   9:53 PM EDT To: Jerene Bears, MD, Marissa Flock, MD, #  Hi  This patient is schedule to see your practice 03/23/21 but I cant see with whom and consult for colonoscopy she is now requesting EGD evaluation dysphagia and ho dilatation x 2 as well with colonoscopy as well  Can you add this on to referral please and its been changed  EGD + colonoscopy consultation  Thank you

## 2021-03-09 NOTE — Telephone Encounter (Signed)
Called patient again to schedule left voicemail

## 2021-03-10 NOTE — Telephone Encounter (Signed)
Pt returned call, schedulers assisted in scheduling her appt for 04/07/21 at 10:30 am with Ellouise Newer, PA-C.

## 2021-03-24 ENCOUNTER — Ambulatory Visit: Payer: Self-pay | Admitting: Surgery

## 2021-03-24 DIAGNOSIS — E042 Nontoxic multinodular goiter: Secondary | ICD-10-CM | POA: Diagnosis not present

## 2021-03-24 DIAGNOSIS — E049 Nontoxic goiter, unspecified: Secondary | ICD-10-CM | POA: Diagnosis not present

## 2021-04-07 ENCOUNTER — Ambulatory Visit (INDEPENDENT_AMBULATORY_CARE_PROVIDER_SITE_OTHER): Payer: Medicare Other | Admitting: Physician Assistant

## 2021-04-07 ENCOUNTER — Encounter: Payer: Self-pay | Admitting: Physician Assistant

## 2021-04-07 VITALS — BP 122/70 | HR 82 | Ht 64.0 in | Wt 190.1 lb

## 2021-04-07 DIAGNOSIS — Z86010 Personal history of colonic polyps: Secondary | ICD-10-CM | POA: Diagnosis not present

## 2021-04-07 DIAGNOSIS — Z800 Family history of malignant neoplasm of digestive organs: Secondary | ICD-10-CM | POA: Diagnosis not present

## 2021-04-07 DIAGNOSIS — R1310 Dysphagia, unspecified: Secondary | ICD-10-CM

## 2021-04-07 NOTE — Progress Notes (Signed)
Chief Complaint: Dysphagia  HPI:    Marissa King is a 75 year old female with a past medical history of diabetes, reflux and others listed below, known to Dr. Havery King, who was referred to me by Marissa King, Marissa King * for a complaint of dysphagia.    05/18/2016 colonoscopy, cannot see the actual colonoscopy report but it has a comment of a tubular adenoma no family history of colon cancer in her father.    02/25/2021 CBC normal.  CMP with a minimally decreased sodium at 131 otherwise normal.    Today, the patient explains that she had her last EGD and colonoscopy in 2017.  She thinks with Dr. Ferdinand King at [facility].  Describes that her father has a history of pancreatic cancer which spread to his bowel, indications for last colonoscopy were noted is a family history of colon cancer.  Also describes a history of problems with reflux and dysphagia, apparently has had 2 endoscopies both with dilation, vaguely describes a possible "perforation" in the past which has healed up now with what sounds like a holistic lozenge she is getting on [employer].  Patient tells me that she is not having much issue with her dysphagia now but would like things checked on.    Does describe problems with anesthesia in the past, she is not sure which anesthetic was used but it was when she had her last endo/colon and they "could not wake me up for 40 minutes".  Tells me "I could hear them but I could not wake up".  Does have many allergies listed on her allergy list.    Denies fever, chills, weight loss, abdominal pain or symptoms that awaken her from sleep.  Past Medical History:  Diagnosis Date   Allergy    Arthritis    DDD cervical spine chronic neck pain    Asthma    with h/o chronic bronchitis   Back pain    mid back pain h/ o trauma    Diabetes mellitus without complication ([place])    Dysphagia    dilated x 2   Frequent headaches    Gastric ulcer    GERD (gastroesophageal reflux disease)     History of chicken pox    Hyperlipidemia    Hypertension    Migraines    Thyroid disease    mng     Past Surgical History:  Procedure Laterality Date   ABDOMINAL HYSTERECTOMY     DUB 1 ovary intact    CATARACT EXTRACTION     10/2020 and 12/2020   CHOLECYSTECTOMY     09/02/19   ESOPHAGEAL DILATION     x2    Current Outpatient Medications  Medication Sig Dispense Refill   albuterol (PROVENTIL HFA;VENTOLIN HFA) 108 (90 Base) MCG/ACT inhaler Inhale into the lungs.      amLODipine (NORVASC) 2.5 MG tablet Take 1 tablet (2.5 mg total) by mouth daily. 90 tablet 3   aspirin EC 81 MG tablet Take 81 mg by mouth daily.     Azilsartan-Chlorthalidone 40-12.5 MG TABS Take 1 tablet by mouth every morning. For blood 90 tablet 3   budesonide-formoterol (SYMBICORT) 160-4.5 MCG/ACT inhaler Inhale 2 puffs into the lungs 2 (two) times daily. Rinse mouth for asthma 1 each 11   COLLAGEN PO Take by mouth.     Evolocumab (REPATHA SURECLICK) [id number] MG/ML SOAJ INJECT 140 MG INTO THE SKIN EVERY 14 (FOURTEEN) DAYS. 2 mL 11   glucose blood test strip Check sugars 1-2 times daily. E11.9 Non-insulin  dependent     hydrALAZINE (APRESOLINE) 50 MG tablet Take 1 tablet (50 mg total) by mouth 3 (three) times daily. 270 tablet 3   Insulin Pen Needle (PEN NEEDLES) 30G X 8 MM MISC 1 Device by Does not apply route every 14 (fourteen) days. For repatha or preferred pen needle dispense 10 each 11   ipratropium (ATROVENT) 0.06 % nasal spray Place 2 sprays into the nose 4 (four) times daily. 15 mL 12   loratadine (CLARITIN) 10 MG tablet Take 1 tablet (10 mg total) by mouth daily as needed for allergies. 90 tablet 3   Multiple Vitamins-Minerals (MULTIVITAMIN WOMENS 50+ ADV PO) Take by mouth.     ONETOUCH DELICA LANCETS FINE MISC USE TO CHECK BLOOD SUGAR 1-2 TIMES DAILY     Polyethyl Glycol-Propyl Glycol (SYSTANE FREE OP) Apply 1 drop to eye daily.     sitaGLIPtin (JANUVIA) 25 MG tablet Take 1 tablet (25 mg total) by mouth daily. 90  tablet 3   VITAMIN D PO Take 5,000 Units by mouth daily.      No current facility-administered medications for this visit.    Allergies as of 04/07/2021 - Review Complete 02/25/2021  Allergen Reaction Noted   Adhesive  [tape] Hives 08/29/2019   Alendronate Other (See Comments) 08/16/2016   Alprazolam Other (See Comments) 10/17/2016   Amlodipine Other (See Comments) 10/17/2016   Atorvastatin Other (See Comments) 07/07/2017   Benzalkonium chloride Other (See Comments) 10/17/2016   Clindamycin Other (See Comments) 10/05/2016   Clonazepam Other (See Comments) 08/12/2015   Clonidine Other (See Comments) 08/12/2015   Escitalopram oxalate Other (See Comments) 08/12/2015   Gabapentin Other (See Comments) 08/12/2015   Hydrocodone-acetaminophen Other (See Comments) 10/17/2016   Iodinated diagnostic agents Swelling 08/29/2019   Iodine Other (See Comments) 10/17/2016   Labetalol Other (See Comments) 08/12/2015   Levofloxacin Other (See Comments) 10/17/2016   Lisinopril-hydrochlorothiazide Other (See Comments) 08/12/2015   Livalo [pitavastatin]  06/07/2018   Lorazepam Other (See Comments) 08/12/2015   Losartan potassium Other (See Comments) 08/12/2015   Losartan potassium-hctz Other (See Comments) 10/17/2016   Meloxicam Other (See Comments) 08/12/2015   Metformin Nausea And Vomiting 03/08/2016   Metoclopramide Other (See Comments) 08/12/2015   Oxcarbazepine Other (See Comments) 08/12/2015   Penicillins  09/26/2012   Plavix [clopidogrel]  10/17/2019   Potassium citrate  09/26/2012   Pravastatin Other (See Comments) 07/07/2017   Rosuvastatin Other (See Comments) 10/05/2017   Shellfish allergy Swelling 08/29/2019   Simvastatin Other (See Comments) 07/07/2017   Sitagliptin Other (See Comments) 10/05/2017   Sodium citrate Other (See Comments) 10/17/2016   Spironolactone  06/06/2018   Sulfa antibiotics Other (See Comments) and Itching 09/26/2012   Zetia [ezetimibe]  07/05/2018    Family  History  Problem Relation Age of Onset   Alcohol abuse Mother    Heart disease Mother        CHF   Hypertension Mother    Hyperlipidemia Mother    Stroke Mother    Mental illness Mother    Alcohol abuse Father    Cancer Father        pancreatitic    Arthritis Sister    COPD Sister    Depression Sister    Drug abuse Sister    Hearing loss Sister    Heart disease Sister        CHF   Hyperlipidemia Sister    Hypertension Sister    Mental illness Sister    COPD Brother    Depression  Brother    Heart disease Brother    Hyperlipidemia Brother    Stroke Brother    Asthma Son    Arthritis Brother    Depression Brother    Heart disease Brother    Hyperlipidemia Brother    Mental illness Brother    Mental illness Brother    Hyperlipidemia Brother    Drug abuse Brother    Diabetes Brother    Depression Brother    COPD Brother    Birth defects Brother    Arthritis Brother     Social History   Socioeconomic History   Marital status: Widowed    Spouse name: Not on file   Number of children: Not on file   Years of education: Not on file   Highest education level: Not on file  Occupational History   Not on file  Tobacco Use   Smoking status: Never   Smokeless tobacco: Never  Substance and Sexual Activity   Alcohol use: No   Drug use: No   Sexual activity: Not Currently  Other Topics Concern   Not on file  Social History Narrative   2 sons, 4 pregnancies    Brother is [name]    Never smoker exposed 2nd hand    No guns    Wears seat belt    Safe in relationship, widowed lives alone   2 year college ed    Social Determinants of Health   Financial Resource Strain: Low Risk    Difficulty of Paying Living Expenses: Not hard at all  Food Insecurity: No Food Insecurity   Worried About Charity fundraiser in the Last Year: Never true   Arboriculturist in the Last Year: Never true  Transportation Needs: No Transportation Needs   Lack of Transportation  (Medical): No   Lack of Transportation (Non-Medical): No  Physical Activity: Not on file  Stress: No Stress Concern Present   Feeling of Stress : Not at all  Social Connections: Unknown   Frequency of Communication with Friends and Family: More than three times a week   Frequency of Social Gatherings with Friends and Family: Not on file   Attends Religious Services: Not on Electrical engineer or Organizations: Not on file   Attends Archivist Meetings: Not on file   Marital Status: Not on file  Intimate Partner Violence: Not on file    Review of Systems:    Constitutional: No weight loss, fever or chills Skin: No rash Cardiovascular: No chest pain Respiratory: No SOB  Gastrointestinal: See HPI and otherwise negative Genitourinary: No dysuria  Neurological: No headache, dizziness or syncope Musculoskeletal: No new muscle or joint pain Hematologic: No bleeding  Psychiatric: No history of depression or anxiety   Physical Exam:  Vital signs: BP 122/70   Pulse 82   Ht '5\' 4"'$  (1.626 m)   Wt 190 lb 2 oz (86.2 kg)   BMI 32.63 kg/m    Constitutional:   Pleasant AA female appears to be in NAD, Well developed, Well nourished, alert and cooperative Head:  Normocephalic and atraumatic. Eyes:   PEERL, EOMI. No icterus. Conjunctiva pink. Ears:  Normal auditory acuity. Neck:  Supple Throat: Oral cavity and pharynx without inflammation, swelling or lesion.  Respiratory: Respirations even and unlabored. Lungs clear to auscultation bilaterally.   No wheezes, crackles, or rhonchi.  Cardiovascular: Normal S1, S2. No MRG. Regular rate and rhythm. No peripheral edema, cyanosis or pallor.  Gastrointestinal:  Soft, nondistended,mild epigastric ttp, No rebound or guarding. Normal bowel sounds. No appreciable masses or hepatomegaly. Rectal:  Not performed.  Msk:  Symmetrical without gross deformities. Without edema, no deformity or joint abnormality.  Neurologic:  Alert and   oriented x4;  grossly normal neurologically.  Skin:   Dry and intact without significant lesions or rashes. Psychiatric: Demonstrates good judgement and reason without abnormal affect or behaviors.  RELEVANT LABS AND IMAGING: CBC    Component Value Date/Time   WBC 4.9 02/25/2021 1217   RBC 4.58 02/25/2021 1217   HGB 13.4 02/25/2021 1217   HCT 39.7 02/25/2021 1217   PLT 269.0 02/25/2021 1217   MCV 86.6 02/25/2021 1217   MCH 25.6 (L) 02/26/2011 1412   MCHC 33.7 02/25/2021 1217   RDW 12.6 02/25/2021 1217   LYMPHSABS 1.1 02/25/2021 1217   MONOABS 0.5 02/25/2021 1217   EOSABS 0.1 02/25/2021 1217   BASOSABS 0.0 02/25/2021 1217    CMP     Component Value Date/Time   NA 131 (L) 02/25/2021 1217   K 4.2 02/25/2021 1217   CL 94 (L) 02/25/2021 1217   CO2 28 02/25/2021 1217   GLUCOSE 96 02/25/2021 1217   BUN 11 02/25/2021 1217   CREATININE 0.91 02/25/2021 1217   CALCIUM 9.4 02/25/2021 1217   PROT 7.0 02/25/2021 1217   ALBUMIN 3.9 02/25/2021 1217   AST 16 02/25/2021 1217   ALT 13 02/25/2021 1217   ALKPHOS 77 02/25/2021 1217   BILITOT 0.5 02/25/2021 1217   GFRNONAA >60 02/26/2011 1412   GFRAA >60 02/26/2011 1412    Assessment: 1.  Dysphagia: Problems worse in the past, some better now with a daily holistic laundry lozenge she is using, apparently had 2 dilations in the past and history of a perforation?, we will try to get records 2.  Family history of colon cancer: Patient tells me that her father had colon cancer but primary was pancreatic cancer 3.  Personal history of adenomatous polyps: Last colonoscopy 5 years ago  Plan: 1.  Discussed with patient that I would like to work on getting her old records.  Certainly if she had trouble with anesthesia in the past we need to figure this out before scheduling her. 2.  We will have our anesthesiologist Marissa King call her to discuss procedures further.  She may benefit from having these scheduled at the hospital. 3.  Would likely  benefit from repeat colonoscopy for a family history of colon cancer and EGD for dysphagia symptoms.  Again we will wait to hear on whether these should be scheduled in the hospital or [place]. 4.  Discussed above with patient.  She will get a call from our office.  Patient was assigned to Dr. Havery King this morning.  Marissa Newer, PA-C [facility] 04/07/2021, 10:07 AM  Cc: Marissa King, Marissa King *

## 2021-04-07 NOTE — Progress Notes (Signed)
Agree with assessment as outlined. If not proceeding with EGD, pending records, may pursue barium swallow initially. If no obvious stricture or abnormality, based on prior records, may not need endoscopy at this time.

## 2021-04-07 NOTE — Patient Instructions (Signed)
Will have Osvaldo Angst call and discuss procedure with you.   If you are age 74 or older, your body mass index should be between 23-30. Your Body mass index is 32.63 kg/m. If this is out of the aforementioned range listed, please consider follow up with your Primary Care Provider.  If you are age 19 or younger, your body mass index should be between 19-25. Your Body mass index is 32.63 kg/m. If this is out of the aformentioned range listed, please consider follow up with your Primary Care Provider.   __________________________________________________________  The [facility] providers would like to encourage you to use [facility] to communicate with providers for non-urgent requests or questions.  Due to long hold times on the telephone, sending your provider a message by [facility] may be a faster and more efficient way to get a response.  Please allow 48 business hours for a response.  Please remember that this is for non-urgent requests.

## 2021-04-29 ENCOUNTER — Telehealth: Payer: Self-pay | Admitting: Internal Medicine

## 2021-04-29 NOTE — Telephone Encounter (Signed)
[employer] calling and reccomending statin therapy for this patient due to a history of Cardiovascular disease . Recommending moderate to high intensity therapy. They will fax over this recommendation paperwork for a response

## 2021-05-03 ENCOUNTER — Ambulatory Visit (INDEPENDENT_AMBULATORY_CARE_PROVIDER_SITE_OTHER): Payer: Medicare Other

## 2021-05-03 ENCOUNTER — Telehealth: Payer: Self-pay

## 2021-05-03 VITALS — Ht 64.0 in | Wt 190.0 lb

## 2021-05-03 DIAGNOSIS — I10 Essential (primary) hypertension: Secondary | ICD-10-CM

## 2021-05-03 DIAGNOSIS — Z0000 Encounter for general adult medical examination without abnormal findings: Secondary | ICD-10-CM | POA: Diagnosis not present

## 2021-05-03 MED ORDER — AMLODIPINE BESYLATE 2.5 MG PO TABS: 2.5000 mg | ORAL_TABLET | Freq: Every day | ORAL | 3 refills | Status: DC

## 2021-05-03 MED ORDER — AMLODIPINE BESYLATE 2.5 MG PO TABS: 2.5000 mg | ORAL_TABLET | Freq: Every day | ORAL | 2 refills | Status: DC

## 2021-05-03 NOTE — Telephone Encounter (Signed)
Refill sent as requested. 

## 2021-05-03 NOTE — Patient Instructions (Signed)
  Ms. Deshmukh , Thank you for taking time to come for your Medicare Wellness Visit. I appreciate your ongoing commitment to your health goals. Please review the following plan we discussed and let me know if I can assist you in the future.   These are the goals we discussed:  Goals       Patient Stated     Follow up with Primary Care Provider (pt-stated)      As needed        This is a list of the screening recommended for you and due dates:  Health Maintenance  Topic Date Due   Tetanus Vaccine  Never done   Zoster (Shingles) Vaccine (1 of 2) 05/28/2021*   COVID-19 Vaccine (3 - Booster for Pfizer series) 03/01/2022*   Hemoglobin A1C  08/28/2021   Complete foot exam   02/25/2022   Mammogram  05/07/2022   Colon Cancer Screening  05/18/2026   DEXA scan (bone density measurement)  Completed   Hepatitis C Screening: USPSTF Recommendation to screen - Ages 69-79 yo.  Completed   HPV Vaccine  Aged Out   Flu Shot  Discontinued   Eye exam for diabetics  Discontinued  *Topic was postponed. The date shown is not the original due date.

## 2021-05-03 NOTE — Addendum Note (Signed)
Addended by: Nanci Pina on: 05/03/2021 02:32 PM   Modules accepted: Orders

## 2021-05-03 NOTE — Progress Notes (Signed)
Subjective:   Marissa King is a 75 y.o. female who presents for Medicare Annual (Subsequent) preventive examination.  Review of Systems    No ROS.  Medicare Wellness Virtual Visit.  Visual/audio telehealth visit, UTA vital signs.   See social history for additional risk factors.   Cardiac Risk Factors include: advanced age (>19men, >11 women);diabetes mellitus;hypertension     Objective:    Today's Vitals   05/03/21 1116  Weight: 190 lb (86.2 kg)  Height: 5\' 4"  (1.626 m)   Body mass index is 32.61 kg/m.  Advanced Directives 05/03/2021 04/30/2020 04/30/2019  Does Patient Have a Medical Advance Directive? No Yes No  Type of Advance Directive - [place];Living will -  Does patient want to make changes to medical advance directive? - No - Patient declined Yes (MAU/Ambulatory/Procedural Areas - Information given)  Copy of [place] in Chart? - No - copy requested -  Would patient like information on creating a medical advance directive? No - Patient declined - -    Current Medications (verified) Outpatient Encounter Medications as of 05/03/2021  Medication Sig   albuterol (PROVENTIL HFA;VENTOLIN HFA) 108 (90 Base) MCG/ACT inhaler Inhale into the lungs.    amLODipine (NORVASC) 2.5 MG tablet Take 1 tablet (2.5 mg total) by mouth daily.   aspirin EC 81 MG tablet Take 81 mg by mouth daily.   Azilsartan-Chlorthalidone 40-12.5 MG TABS Take 1 tablet by mouth every morning. For blood   budesonide-formoterol (SYMBICORT) 160-4.5 MCG/ACT inhaler Inhale 2 puffs into the lungs 2 (two) times daily. Rinse mouth for asthma   COLLAGEN PO Take by mouth.   Collagen-Vitamin C-Biotin (COLLAGEN 1500/C) 500-50-0.8 MG CAPS Take by mouth.   Evolocumab (REPATHA SURECLICK) 557 MG/ML SOAJ INJECT 140 MG INTO THE SKIN EVERY 14 (FOURTEEN) DAYS.   glucose blood test strip Check sugars 1-2 times daily. E11.9 Non-insulin dependent   hydrALAZINE (APRESOLINE) 50 MG tablet Take  1 tablet (50 mg total) by mouth 3 (three) times daily.   Insulin Pen Needle (PEN NEEDLES) 30G X 8 MM MISC 1 Device by Does not apply route every 14 (fourteen) days. For repatha or preferred pen needle dispense   ipratropium (ATROVENT) 0.06 % nasal spray Place 2 sprays into the nose 4 (four) times daily.   loratadine (CLARITIN) 10 MG tablet Take 1 tablet (10 mg total) by mouth daily as needed for allergies.   Multiple Vitamins-Minerals (MULTIVITAMIN WOMENS 50+ ADV PO) Take by mouth.   ONETOUCH DELICA LANCETS FINE MISC USE TO CHECK BLOOD SUGAR 1-2 TIMES DAILY   Polyethyl Glycol-Propyl Glycol (SYSTANE FREE OP) Apply 1 drop to eye daily.   sitaGLIPtin (JANUVIA) 25 MG tablet Take 1 tablet (25 mg total) by mouth daily.   VITAMIN D PO Take 5,000 Units by mouth daily.    No facility-administered encounter medications on file as of 05/03/2021.    Allergies (verified) Alendronate, Alprazolam, Amlodipine, Atorvastatin, Benzalkonium chloride, Clindamycin, Clonazepam, Clonidine, Escitalopram oxalate, Gabapentin, Hydrocodone-acetaminophen, Iodinated diagnostic agents, Iodine, Labetalol, Levofloxacin, Lisinopril-hydrochlorothiazide, Livalo [pitavastatin], Lorazepam, Losartan potassium, Losartan potassium-hctz, Meloxicam, Metformin, Metoclopramide, Oxcarbazepine, Penicillins, Plavix [clopidogrel], Potassium citrate, Pravastatin, Rosuvastatin, Shellfish allergy, Simvastatin, Sitagliptin, Sodium citrate, Spironolactone, Sulfa antibiotics, Tape, and Zetia [ezetimibe]   History: Past Medical History:  Diagnosis Date   Allergy    Arthritis    DDD cervical spine chronic neck pain    Asthma    with h/o chronic bronchitis   Back pain    mid back pain h/ o trauma  Diabetes mellitus without complication ([place])    Dysphagia    dilated x 2   Frequent headaches    Gastric ulcer    GERD (gastroesophageal reflux disease)    History of chicken pox    Hyperlipidemia    Hypertension    Migraines    Thyroid disease     mng    Past Surgical History:  Procedure Laterality Date   ABDOMINAL HYSTERECTOMY     DUB 1 ovary intact    CATARACT EXTRACTION     10/2020 and 12/2020   CHOLECYSTECTOMY     09/02/19   ESOPHAGEAL DILATION     x2   Family History  Problem Relation Age of Onset   Alcohol abuse Mother    Heart disease Mother        CHF   Hypertension Mother    Hyperlipidemia Mother    Stroke Mother    Mental illness Mother    Alcohol abuse Father    Cancer Father        pancreatitic    Arthritis Sister    COPD Sister    Depression Sister    Drug abuse Sister    Hearing loss Sister    Heart disease Sister        CHF   Hyperlipidemia Sister    Hypertension Sister    Mental illness Sister    COPD Brother    Depression Brother    Heart disease Brother    Hyperlipidemia Brother    Stroke Brother    Asthma Son    Arthritis Brother    Depression Brother    Heart disease Brother    Hyperlipidemia Brother    Mental illness Brother    Mental illness Brother    Hyperlipidemia Brother    Drug abuse Brother    Diabetes Brother    Depression Brother    COPD Brother    Birth defects Brother    Arthritis Brother    Social History   Socioeconomic History   Marital status: Widowed    Spouse name: Not on file   Number of children: Not on file   Years of education: Not on file   Highest education level: Not on file  Occupational History   Not on file  Tobacco Use   Smoking status: Never   Smokeless tobacco: Never  Substance and Sexual Activity   Alcohol use: No   Drug use: No   Sexual activity: Not Currently  Other Topics Concern   Not on file  Social History Narrative   2 sons, 4 pregnancies    Brother is [name]    Never smoker exposed 2nd hand    No guns    Wears seat belt    Safe in relationship, widowed lives alone   2 year college ed    Social Determinants of Health   Financial Resource Strain: Low Risk    Difficulty of Paying Living Expenses: Not hard at  all  Food Insecurity: No Food Insecurity   Worried About Charity fundraiser in the Last Year: Never true   Arboriculturist in the Last Year: Never true  Transportation Needs: No Transportation Needs   Lack of Transportation (Medical): No   Lack of Transportation (Non-Medical): No  Physical Activity: Sufficiently Active   Days of Exercise per Week: 4 days   Minutes of Exercise per Session: 40 min  Stress: No Stress Concern Present   Feeling of Stress : Not at  all  Social Connections: Unknown   Frequency of Communication with Friends and Family: More than three times a week   Frequency of Social Gatherings with Friends and Family: Not on file   Attends Religious Services: Not on Electrical engineer or Organizations: Not on file   Attends Archivist Meetings: Not on file   Marital Status: Not on file    Tobacco Counseling Counseling given: Not Answered   Clinical Intake:  Pre-visit preparation completed: Yes        Diabetes: No  How often do you need to have someone help you when you read instructions, pamphlets, or other written materials from your doctor or pharmacy?: 1 - Never Financial Strains and Diabetes Management:  Are you having any financial strains with the device, your supplies or your medication? No .  Does the patient want to be seen by Chronic Care Management for management of their diabetes?  No  Would the patient like to be referred to a Nutritionist or for Diabetic Management?  No  Interpreter Needed?: No      Activities of Daily Living In your present state of health, do you have any difficulty performing the following activities: 05/03/2021  Hearing? N  Vision? N  Difficulty concentrating or making decisions? N  Walking or climbing stairs? N  Dressing or bathing? N  Doing errands, shopping? N  Preparing Food and eating ? N  Using the Toilet? N  In the past six months, have you accidently leaked urine? N  Do you have  problems with loss of bowel control? N  Managing your Medications? N  Managing your Finances? N  Housekeeping or managing your Housekeeping? N  Some recent data might be hidden    Patient Care Team: McLean-Scocuzza, Nino Glow, MD as PCP - General (Internal Medicine) Belva Crome, MD as PCP - Cardiology (Cardiology)  Indicate any recent Medical Services you may have received from other than [facility] providers in the past year (date may be approximate).     Assessment:   This is a routine wellness examination for [facility].  I connected with Chris today by telephone and verified that I am speaking with the correct person using two identifiers. Location patient: home Location provider: work Persons participating in the virtual visit: patient, Marine scientist.    I discussed the limitations, risks, security and privacy concerns of performing an evaluation and management service by telephone and the availability of in person appointments. The patient expressed understanding and verbally consented to this telephonic visit.    Interactive audio and video telecommunications were attempted between this provider and patient, however failed, due to patient having technical difficulties OR patient did not have access to video capability.  We continued and completed visit with audio only.  Some vital signs may be absent or patient reported.   Hearing/Vision screen Hearing Screening - Comments:: Patient is able to hear conversational tones without difficulty. No issues reported. Vision Screening - Comments:: Cataract extraction, bilateral     Dietary issues and exercise activities discussed:   Low carb diet Good water intake   Goals Addressed               This Visit's Progress     Patient Stated     Follow up with Primary Care Provider (pt-stated)        Keep all routine scheduled appointments       Depression Screen [facility] 2/9 Scores 05/03/2021 06/10/2020 04/30/2020 10/17/2019 05/22/2019 04/30/2019  02/13/2019  PHQ - 2 Score 0 0 0 0 0 0 0    Fall Risk Fall Risk  05/03/2021 02/25/2021 07/09/2020 06/10/2020 04/30/2020  Falls in the past year? 0 0 0 0 0  Number falls in past yr: - 0 0 0 0  Injury with Fall? - 0 0 0 -  Risk for fall due to : - - - - -  Follow up Falls evaluation completed Falls evaluation completed Falls evaluation completed Falls evaluation completed Falls evaluation completed    [place]: Adequate lighting in your home to reduce risk of falls? Yes   ASSISTIVE DEVICES UTILIZED TO PREVENT FALLS: Use of a cane, walker or w/c? No   TIMED UP AND GO: Was the test performed? No .   Cognitive Function:  Patient is alert and oriented x3.  Enjoys reading, manages her own finances, completes puzzles and colors for brain stimulating.    6CIT Screen 04/30/2019  What Year? 0 points  What month? 0 points  What time? 0 points  Count back from 20 0 points  Months in reverse 0 points  Repeat phrase 0 points  Total Score 0    Immunizations Immunization History  Administered Date(s) Administered   PFIZER(Purple Top)SARS-COV-2 Vaccination 02/19/2020, 03/13/2020   Pneumococcal Conjugate-13 10/17/2016   Pneumococcal Polysaccharide-23 02/08/2018    TDAP status: Due, Education has been provided regarding the importance of this vaccine. Advised may receive this vaccine at local pharmacy or Health Dept. Aware to provide a copy of the vaccination record if obtained from local pharmacy or Health Dept. Verbalized acceptance and understanding. Deferred.   Health Maintenance Health Maintenance  Topic Date Due   TETANUS/TDAP  Never done   Zoster Vaccines- Shingrix (1 of 2) 05/28/2021 (Originally 01/07/1997)   COVID-19 Vaccine (3 - Booster for [place] series) 03/01/2022 (Originally 08/13/2020)   HEMOGLOBIN A1C  08/28/2021   FOOT EXAM  02/25/2022   MAMMOGRAM  05/07/2022   COLONOSCOPY (Pts 45-64yrs Insurance coverage will need to be confirmed)   05/18/2026   DEXA SCAN  Completed   Hepatitis C Screening  Completed   HPV VACCINES  Aged Out   INFLUENZA VACCINE  Discontinued   OPHTHALMOLOGY EXAM  Discontinued   Mammogram- scheduled for 05/10/21  Vision Screening: Recommended annual ophthalmology exams for early detection of glaucoma and other disorders of the eye.  Dental Screening: Recommended annual dental exams for proper oral hygiene  Community Resource Referral / Chronic Care Management: CRR required this visit?  No   CCM required this visit?  No      Plan:   Keep all routine maintenance appointments.   I have personally reviewed and noted the following in the patient's chart:   Medical and social history Use of alcohol, tobacco or illicit drugs  Current medications and supplements including opioid prescriptions. Not taking opioid.  Functional ability and status Nutritional status Physical activity Advanced directives List of other physicians Hospitalizations, surgeries, and ER visits in previous 12 months Vitals Screenings to include cognitive, depression, and falls Referrals and appointments  In addition, I have reviewed and discussed with patient certain preventive protocols, quality metrics, and best practice recommendations. A written personalized care plan for preventive services as well as general preventive health recommendations were provided to patient via mail.     Varney Biles, LPN   [id number]

## 2021-05-03 NOTE — Telephone Encounter (Signed)
Medication refill

## 2021-05-03 NOTE — Telephone Encounter (Signed)
Patient would like for her amLODipine (NORVASC) 2.5 MG tablet to be sent to the [facility] phone number [phone] instead of [facility] and for the [facility] to [facility] to be cancelled.Please call the patient with an update.

## 2021-05-10 ENCOUNTER — Other Ambulatory Visit: Payer: Self-pay

## 2021-05-10 ENCOUNTER — Ambulatory Visit
Admission: RE | Admit: 2021-05-10 | Discharge: 2021-05-10 | Disposition: A | Discharge status: Home / Self Care | Payer: Medicare Other | Source: Ambulatory Visit | Attending: Internal Medicine | Admitting: Internal Medicine

## 2021-05-10 DIAGNOSIS — Z1231 Encounter for screening mammogram for malignant neoplasm of breast: Secondary | ICD-10-CM | POA: Diagnosis not present

## 2021-05-18 ENCOUNTER — Telehealth: Payer: Self-pay | Admitting: Physician Assistant

## 2021-05-18 DIAGNOSIS — Z800 Family history of malignant neoplasm of digestive organs: Secondary | ICD-10-CM

## 2021-05-18 DIAGNOSIS — Z86010 Personal history of colonic polyps: Secondary | ICD-10-CM

## 2021-05-18 DIAGNOSIS — R1310 Dysphagia, unspecified: Secondary | ICD-10-CM

## 2021-05-18 NOTE — Telephone Encounter (Signed)
Inbound call from pt requesting a call back stating that she needed to sch an endo and colon. Pt stated that she never received a call back due to her having trouble with anesthesia. Please advise. Thank you.

## 2021-05-19 MED ORDER — CLENPIQ 10-3.5-12 MG-GM -GM/160ML PO SOLN: 1.0000 | ORAL | 0 refills | Status: DC

## 2021-05-19 NOTE — Telephone Encounter (Addendum)
Spoke with patient to schedule EGD with dilation and colonoscopy. Patient has been scheduled for EGD/colon in the [facility] with Dr. Havery Moros on Friday, 06/11/21 at 3 PM. Pt is aware that she will need to arrive with a care partner by 2 PM. Pt is aware that I will send her prep to her pharmacy, she confirmed pharmacy on file. Pt has been advised that her instructions will be mailed to her. Pt verbalized understanding and had no concerns at the end of the call.  Ambulatory referral to GI in epic.   Due to multiple allergies, CLENPIQ prep sent to pharmacy.

## 2021-05-27 ENCOUNTER — Telehealth: Payer: Self-pay

## 2021-05-27 NOTE — Telephone Encounter (Signed)
Confirmed signed and faxed [place] lipid management form. Form has been sent to scan.

## 2021-06-03 ENCOUNTER — Telehealth: Payer: Self-pay | Admitting: Physician Assistant

## 2021-06-03 NOTE — Telephone Encounter (Signed)
Patient called states she is taking three different medications for HBP and is not suppose to take anything w\sodium asked to speak with someone regarding the Clenpiq prep medication.

## 2021-06-04 NOTE — Telephone Encounter (Signed)
Returned call to patient, pt states that she has high blood pressure and takes 3 BP medications. Pt reports that she is supposed to be on a low sodium diet. Advised that all of the preps contain electrolytes, advised that it is not just sodium in the prep. Advised patient that she will be on clear liquids the day prior to her appt and the prep will help replenish those lost electrolytes. Advised patient that she will need to continue to take all necessary medication up until 3 hours before her procedure. Patient verbalized understanding and had no concerns at the end of the call.

## 2021-06-11 ENCOUNTER — Other Ambulatory Visit: Payer: Self-pay

## 2021-06-11 ENCOUNTER — Encounter: Payer: Self-pay | Admitting: Gastroenterology

## 2021-06-11 ENCOUNTER — Ambulatory Visit (AMBULATORY_SURGERY_CENTER): Payer: Medicare Other | Admitting: Gastroenterology

## 2021-06-11 VITALS — BP 161/90 | HR 73 | Temp 98.9°F | Resp 12 | Ht 64.0 in | Wt 190.0 lb

## 2021-06-11 DIAGNOSIS — K2970 Gastritis, unspecified, without bleeding: Secondary | ICD-10-CM

## 2021-06-11 DIAGNOSIS — D122 Benign neoplasm of ascending colon: Secondary | ICD-10-CM

## 2021-06-11 DIAGNOSIS — K31819 Angiodysplasia of stomach and duodenum without bleeding: Secondary | ICD-10-CM | POA: Diagnosis not present

## 2021-06-11 DIAGNOSIS — R1310 Dysphagia, unspecified: Secondary | ICD-10-CM

## 2021-06-11 DIAGNOSIS — Z86010 Personal history of colonic polyps: Secondary | ICD-10-CM

## 2021-06-11 DIAGNOSIS — J45909 Unspecified asthma, uncomplicated: Secondary | ICD-10-CM | POA: Diagnosis not present

## 2021-06-11 DIAGNOSIS — K449 Diaphragmatic hernia without obstruction or gangrene: Secondary | ICD-10-CM | POA: Diagnosis not present

## 2021-06-11 DIAGNOSIS — K317 Polyp of stomach and duodenum: Secondary | ICD-10-CM

## 2021-06-11 DIAGNOSIS — E119 Type 2 diabetes mellitus without complications: Secondary | ICD-10-CM | POA: Diagnosis not present

## 2021-06-11 DIAGNOSIS — I10 Essential (primary) hypertension: Secondary | ICD-10-CM | POA: Diagnosis not present

## 2021-06-11 MED ORDER — SODIUM CHLORIDE 0.9 % IV SOLN: 500.0000 mL | Freq: Once | INTRAVENOUS | Status: DC

## 2021-06-11 NOTE — Progress Notes (Signed)
Called to room to assist during endoscopic procedure.  Patient ID and intended procedure confirmed with present staff. Received instructions for my participation in the procedure from the performing physician.  

## 2021-06-11 NOTE — Progress Notes (Signed)
Sedate, gd SR, tolerated procedure well, VSS, report to RN 

## 2021-06-11 NOTE — Op Note (Signed)
[facility] Patient Name: Marissa King Procedure Date: 06/11/2021 3:04 PM MRN: [id number] Endoscopist: Remo Lipps P. Havery Moros , MD Age: 75 Referring MD:  Date of Birth: Nov 12, 1945 Gender: Female Account #: [id number] Procedure:                Colonoscopy Indications:              High risk colon cancer surveillance: Personal                            history of colonic polyps (05/2016 - adenoma - Dr.                            Ferdinand Lango) Medicines:                Monitored Anesthesia Care Procedure:                Pre-Anesthesia Assessment:                           - Prior to the procedure, a History and Physical                            was performed, and patient medications and                            allergies were reviewed. The patient's tolerance of                            previous anesthesia was also reviewed. The risks                            and benefits of the procedure and the sedation                            options and risks were discussed with the patient.                            All questions were answered, and informed consent                            was obtained. Prior Anticoagulants: The patient has                            taken no previous anticoagulant or antiplatelet                            agents. ASA Grade Assessment: II - A patient with                            mild systemic disease. After reviewing the risks                            and benefits, the patient was deemed in  satisfactory condition to undergo the procedure.                           After obtaining informed consent, the colonoscope                            was passed under direct vision. Throughout the                            procedure, the patient's blood pressure, pulse, and                            oxygen saturations were monitored continuously. The                            [id number]-HQ190L Colonoscope was introduced through the                             anus and advanced to the the cecum, identified by                            appendiceal orifice and ileocecal valve. The                            colonoscopy was performed without difficulty. The                            patient tolerated the procedure well. The quality                            of the bowel preparation was good. The ileocecal                            valve, appendiceal orifice, and rectum were                            photographed. Scope In: 3:35:12 PM Scope Out: 3:51:50 PM Scope Withdrawal Time: 0 hours 13 minutes 15 seconds  Total Procedure Duration: 0 hours 16 minutes 38 seconds  Findings:                 The perianal and digital rectal examinations were                            normal.                           Two flat polyps were found in the ascending colon.                            The polyps were 4 to 8 mm in size. These polyps                            were removed with a cold snare. Resection and  retrieval were complete.                           Multiple medium-mouthed diverticula were found in                            the entire colon.                           Internal hemorrhoids were found during                            retroflexion, which were prominent and appeared to                            have a "bleb" along one of them.                           The exam was otherwise without abnormality. Complications:            No immediate complications. Estimated blood loss:                            Minimal. Estimated Blood Loss:     Estimated blood loss was minimal. Impression:               - Two 4 to 8 mm polyps in the ascending colon,                            removed with a cold snare. Resected and retrieved.                           - Diverticulosis in the entire examined colon.                           - Internal hemorrhoids.                           - The examination  was otherwise normal. Recommendation:           - Patient has a contact number available for                            emergencies. The signs and symptoms of potential                            delayed complications were discussed with the                            patient. Return to normal activities tomorrow.                            Written discharge instructions were provided to the                            patient.                           -  Resume previous diet.                           - Continue present medications.                           - Await pathology results. Remo Lipps P. Migdalia Olejniczak, MD 06/11/2021 3:58:11 PM This report has been signed electronically.

## 2021-06-11 NOTE — Patient Instructions (Signed)
Please read handouts provided. Continue present medications. Await pathology results. Resume previous diet.   YOU HAD AN ENDOSCOPIC PROCEDURE TODAY AT [facility]:   Refer to the procedure report that was given to you for any specific questions about what was found during the examination.  If the procedure report does not answer your questions, please call your gastroenterologist to clarify.  If you requested that your care partner not be given the details of your procedure findings, then the procedure report has been included in a sealed envelope for you to review at your convenience later.  YOU SHOULD EXPECT: Some feelings of bloating in the abdomen. Passage of more gas than usual.  Walking can help get rid of the air that was put into your GI tract during the procedure and reduce the bloating. If you had a lower endoscopy (such as a colonoscopy or flexible sigmoidoscopy) you may notice spotting of blood in your stool or on the toilet paper. If you underwent a bowel prep for your procedure, you may not have a normal bowel movement for a few days.  Please Note:  You might notice some irritation and congestion in your nose or some drainage.  This is from the oxygen used during your procedure.  There is no need for concern and it should clear up in a day or so.  SYMPTOMS TO REPORT IMMEDIATELY:  Following lower endoscopy (colonoscopy or flexible sigmoidoscopy):  Excessive amounts of blood in the stool  Significant tenderness or worsening of abdominal pains  Swelling of the abdomen that is new, acute  Fever of 100F or higher  Following upper endoscopy (EGD)  Vomiting of blood or coffee ground material  New chest pain or pain under the shoulder blades  Painful or persistently difficult swallowing  New shortness of breath  Fever of 100F or higher  Black, tarry-looking stools  For urgent or emergent issues, a gastroenterologist can be reached at any hour by calling [phone]  [phone]. Do not use MyChart messaging for urgent concerns.    DIET:  We do recommend a small meal at first, but then you may proceed to your regular diet.  Drink plenty of fluids but you should avoid alcoholic beverages for 24 hours.  ACTIVITY:  You should plan to take it easy for the rest of today and you should NOT DRIVE or use heavy machinery until tomorrow (because of the sedation medicines used during the test).    FOLLOW UP: Our staff will call the number listed on your records 48-72 hours following your procedure to check on you and address any questions or concerns that you may have regarding the information given to you following your procedure. If we do not reach you, we will leave a message.  We will attempt to reach you two times.  During this call, we will ask if you have developed any symptoms of COVID 19. If you develop any symptoms (ie: fever, flu-like symptoms, shortness of breath, cough etc.) before then, please call [phone].  If you test positive for Covid 19 in the 2 weeks post procedure, please call and report this information to [place].    If any biopsies were taken you will be contacted by phone or by letter within the next 1-3 weeks.  Please call us at [phone] if you have not heard about the biopsies in 3 weeks.    SIGNATURES/CONFIDENTIALITY: You and/or your care partner have signed paperwork which will be entered into your electronic medical record.  These signatures attest to the fact that that the information above on your After Visit Summary has been reviewed and is understood.  Full responsibility of the confidentiality of this discharge information lies with you and/or your care-partner.  

## 2021-06-11 NOTE — Op Note (Signed)
[facility] Patient Name: Marissa King Procedure Date: 06/11/2021 3:10 PM MRN: [id number] Endoscopist: Remo Lipps P. Havery Moros , MD Age: 76 Referring MD:  Date of Birth: [birth year] Gender: Female Account #: [id number] Procedure:                Upper GI endoscopy Indications:              Dysphagia Medicines:                Monitored Anesthesia Care Procedure:                Pre-Anesthesia Assessment:                           - Prior to the procedure, a History and Physical                            was performed, and patient medications and                            allergies were reviewed. The patient's tolerance of                            previous anesthesia was also reviewed. The risks                            and benefits of the procedure and the sedation                            options and risks were discussed with the patient.                            All questions were answered, and informed consent                            was obtained. Prior Anticoagulants: The patient has                            taken no previous anticoagulant or antiplatelet                            agents. ASA Grade Assessment: II - A patient with                            mild systemic disease. After reviewing the risks                            and benefits, the patient was deemed in                            satisfactory condition to undergo the procedure.                           After obtaining informed consent, the endoscope was  passed under direct vision. Throughout the                            procedure, the patient's blood pressure, pulse, and                            oxygen saturations were monitored continuously. The                            Endoscope was introduced through the mouth, and                            advanced to the second part of duodenum. The upper                            GI endoscopy was accomplished without  difficulty.                            The patient tolerated the procedure well. Scope In: Scope Out: Findings:                 Esophagogastric landmarks were identified: the                            Z-line was found at 36 cm, the gastroesophageal                            junction was found at 36 cm and the upper extent of                            the gastric folds was found at 37 cm from the                            incisors.                           A 1 cm hiatal hernia was present.                           The exam of the esophagus was otherwise normal. No                            overt stenosis / stricture appreciated.                           A guidewire was placed and the scope was withdrawn.                            Empiric dilation was performed in the entire                            esophagus with a Savary dilator with moderate  resistance at 17 mm. Relook endoscopy showed small                            mucosal wrent just inferior to UES.                           Multiple small sessile polyps were found in the                            gastric fundus and in the gastric body, suspect                            benign fundic gland polyps. Two representative                            polyps were removed with a cold biopsy forceps.                            Resection and retrieval were complete.                           Patchy mildly erythematous mucosa was found in the                            gastric antrum.                           The exam of the stomach was otherwise normal.                           Biopsies were taken with a cold forceps in the                            gastric body, at the incisura and in the gastric                            antrum for Helicobacter pylori testing.                           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 The duodenal bulb and  second portion                            of the duodenum were otherwise normal. Complications:            No immediate complications. Estimated blood loss:                            Minimal. Estimated Blood Loss:     Estimated blood loss was minimal. Impression:               - Esophagogastric landmarks identified.                           -  1 cm hiatal hernia.                           - Normal esophagus otherwise - empiric dilation                            performed to 65mm with small mucosal wrent inferior                            to UES.                           - Multiple gastric polyps, suspect benign fundic                            gland polyps. Resected and retrieved.                           - Erythematous mucosa in the antrum.                           - Normal stomach otherwise - biopsies taken to rule                            out H pylori                           - Normal duodenal bulb and second portion of the                            duodenum. Recommendation:           - Patient has a contact number available for                            emergencies. The signs and symptoms of potential                            delayed complications were discussed with the                            patient. Return to normal activities tomorrow.                            Written discharge instructions were provided to the                            patient.                           - Resume previous diet.                           - Continue present medications.                           - Await pathology results and course post  dilation Marissa King. Marissa Mcdougall, MD 06/11/2021 4:04:00 PM This report has been signed electronically.

## 2021-06-11 NOTE — Progress Notes (Signed)
VS by [place]. 

## 2021-06-11 NOTE — Progress Notes (Signed)
[facility] History and Physical   Primary Care Physician:  McLean-Scocuzza, Nino Glow, MD   Reason for Procedure:   Dysphagia, history of colon polyps  Plan:    EGD with possible dilation, colonoscopy     HPI: Marissa King is a 75 y.o. female  here for colonoscopy surveillance - history of colon polyps, adenomas removed 05/2016. History of intermittent solid food dysphagia - has had a dilation in the past which relieved her symptoms and she is requesting a dilation today. Patient denies any bowel symptoms at this time. No family history of colon cancer known, father had pancreatic cancer. Otherwise feels well without any cardiopulmonary symptoms and wishes to proceed.    Past Medical History:  Diagnosis Date   Allergy    Arthritis    DDD cervical spine chronic neck pain    Asthma    with h/o chronic bronchitis   Back pain    mid back pain h/ o trauma    Diabetes mellitus without complication ([place])    Dysphagia    dilated x 2   Frequent headaches    Gastric ulcer    GERD (gastroesophageal reflux disease)    History of chicken pox    Hyperlipidemia    Hypertension    Migraines    Thyroid disease    mng     Past Surgical History:  Procedure Laterality Date   ABDOMINAL HYSTERECTOMY     DUB 1 ovary intact    CATARACT EXTRACTION     10/2020 and 12/2020   CHOLECYSTECTOMY     09/02/19   ESOPHAGEAL DILATION     x2    Prior to Admission medications   Medication Sig Start Date End Date Taking? Authorizing Provider  amLODipine (NORVASC) 2.5 MG tablet Take 1 tablet (2.5 mg total) by mouth daily. 05/03/21  Yes Burnard Hawthorne, FNP  aspirin EC 81 MG tablet Take 81 mg by mouth daily.   Yes [provider]  Azilsartan-Chlorthalidone 40-12.5 MG TABS Take 1 tablet by mouth every morning. For blood 10/08/20  Yes McLean-Scocuzza, Nino Glow, MD  budesonide-formoterol [facility]) 160-4.5 MCG/ACT inhaler Inhale 2 puffs into the lungs 2 (two) times daily. Rinse mouth  for asthma 07/09/20  Yes McLean-Scocuzza, Nino Glow, MD  COLLAGEN PO Take by mouth.   Yes [provider]  Collagen-Vitamin C-Biotin (COLLAGEN 1500/C) 500-50-0.8 MG CAPS Take by mouth.   Yes [provider]  glucose blood test strip Check sugars 1-2 times daily. 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 10/08/20  Yes McLean-Scocuzza, Nino Glow, MD  ONETOUCH DELICA LANCETS FINE MISC USE TO CHECK BLOOD SUGAR 1-2 TIMES DAILY 07/19/17  Yes [provider]  sitaGLIPtin (JANUVIA) 25 MG tablet Take 1 tablet (25 mg total) by mouth daily. 10/08/20  Yes McLean-Scocuzza, Nino Glow, MD  VITAMIN D PO Take 5,000 Units by mouth daily.    Yes [provider]  albuterol (PROVENTIL HFA;VENTOLIN HFA) 108 (90 Base) MCG/ACT inhaler Inhale into the lungs.     [provider]  Azilsartan-Chlorthalidone 40-12.5 MG TABS Take 12.5 mg by mouth 1 day or 1 dose.    [provider]  Evolocumab (REPATHA SURECLICK) 782 MG/ML SOAJ INJECT 140 MG INTO THE SKIN EVERY 14 (FOURTEEN) DAYS. 02/25/21   McLean-Scocuzza, Nino Glow, MD  Insulin Pen Needle (PEN NEEDLES) 30G X 8 MM MISC 1 Device by  Does not apply route every 14 (fourteen) days. For repatha or preferred pen needle dispense Patient not taking: Reported on 06/11/2021 02/28/20   McLean-Scocuzza, Nino Glow, MD  ipratropium (ATROVENT) 0.06 % nasal spray Place 2 sprays into the nose 4 (four) times daily. Patient not taking: Reported on 06/11/2021 07/09/20   McLean-Scocuzza, Nino Glow, MD  loratadine (CLARITIN) 10 MG tablet Take 1 tablet (10 mg total) by mouth daily as needed for allergies. Patient not taking: Reported on 06/11/2021 07/09/20   McLean-Scocuzza, Nino Glow, MD  Multiple Vitamins-Minerals (MULTIVITAMIN WOMENS 50+ ADV PO) Take by mouth.    [provider]  Polyethyl Glycol-Propyl Glycol (SYSTANE FREE OP) Apply  1 drop to eye daily. Patient not taking: Reported on 06/11/2021    [provider]    Current Outpatient Medications  Medication Sig Dispense Refill   amLODipine (NORVASC) 2.5 MG tablet Take 1 tablet (2.5 mg total) by mouth daily. 90 tablet 2   aspirin EC 81 MG tablet Take 81 mg by mouth daily.     Azilsartan-Chlorthalidone 40-12.5 MG TABS Take 1 tablet by mouth every morning. For blood 90 tablet 3   budesonide-formoterol (SYMBICORT) 160-4.5 MCG/ACT inhaler Inhale 2 puffs into the lungs 2 (two) times daily. Rinse mouth for asthma 1 each 11   COLLAGEN PO Take by mouth.     Collagen-Vitamin C-Biotin (COLLAGEN 1500/C) 500-50-0.8 MG CAPS Take by mouth.     glucose blood test strip Check sugars 1-2 times daily. 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 270 tablet 3   ONETOUCH DELICA LANCETS FINE MISC USE TO CHECK BLOOD SUGAR 1-2 TIMES DAILY     sitaGLIPtin (JANUVIA) 25 MG tablet Take 1 tablet (25 mg total) by mouth daily. 90 tablet 3   VITAMIN D PO Take 5,000 Units by mouth daily.      albuterol (PROVENTIL HFA;VENTOLIN HFA) 108 (90 Base) MCG/ACT inhaler Inhale into the lungs.      Azilsartan-Chlorthalidone 40-12.5 MG TABS Take 12.5 mg by mouth 1 day or 1 dose.     Evolocumab (REPATHA SURECLICK) 563 MG/ML SOAJ INJECT 140 MG INTO THE SKIN EVERY 14 (FOURTEEN) DAYS. 2 mL 11   Insulin Pen Needle (PEN NEEDLES) 30G X 8 MM MISC 1 Device by Does not apply route every 14 (fourteen) days. For repatha or preferred pen needle dispense (Patient not taking: Reported on 06/11/2021) 10 each 11   ipratropium (ATROVENT) 0.06 % nasal spray Place 2 sprays into the nose 4 (four) times daily. (Patient not taking: Reported on 06/11/2021) 15 mL 12   loratadine (CLARITIN) 10 MG tablet Take 1 tablet (10 mg total) by mouth daily as needed for allergies. (Patient not taking: Reported on 06/11/2021) 90 tablet 3   Multiple  Vitamins-Minerals (MULTIVITAMIN WOMENS 50+ ADV PO) Take by mouth.     Polyethyl Glycol-Propyl Glycol (SYSTANE FREE OP) Apply 1 drop to eye daily. (Patient not taking: Reported on 06/11/2021)     Current Facility-Administered Medications  Medication Dose Route Frequency Provider Last Rate Last Admin   0.9 %  sodium chloride infusion  500 mL Intravenous Once Yetta Flock, MD        Allergies as of 06/11/2021 - Review Complete 06/11/2021  Allergen Reaction Noted   Alendronate Other (See Comments) 08/16/2016   Alprazolam Other (See Comments) 10/17/2016   Amlodipine Other (See Comments) 10/17/2016   Atorvastatin Other (See Comments) 07/07/2017  Benzalkonium chloride Other (See Comments) 10/17/2016   Clindamycin Other (See Comments) 10/05/2016   Clonazepam Other (See Comments) 08/12/2015   Clonidine Other (See Comments) 08/12/2015   Escitalopram oxalate Other (See Comments) 08/12/2015   Gabapentin Other (See Comments) 08/12/2015   Hydrocodone-acetaminophen Other (See Comments) 10/17/2016   Iodinated diagnostic agents Swelling 08/29/2019   Iodine Other (See Comments) 10/17/2016   Labetalol Other (See Comments) 08/12/2015   Levofloxacin Other (See Comments) 10/17/2016   Lisinopril-hydrochlorothiazide Other (See Comments) 08/12/2015   Livalo [pitavastatin]  06/07/2018   Lorazepam Other (See Comments) 08/12/2015   Losartan potassium Other (See Comments) 08/12/2015   Losartan potassium-hctz Other (See Comments) 10/17/2016   Meloxicam Other (See Comments) 08/12/2015   Metformin Nausea And Vomiting 03/08/2016   Metoclopramide Other (See Comments) 08/12/2015   Oxcarbazepine Other (See Comments) 08/12/2015   Penicillins  09/26/2012   Plavix [clopidogrel]  10/17/2019   Potassium citrate  09/26/2012   Pravastatin Other (See Comments) 07/07/2017   Rosuvastatin Other (See Comments) 10/05/2017   Shellfish allergy Swelling 08/29/2019   Simvastatin Other (See Comments) 07/07/2017    Sitagliptin Other (See Comments) 10/05/2017   Sodium citrate Other (See Comments) 10/17/2016   Spironolactone  06/06/2018   Sulfa antibiotics Other (See Comments) and Itching 09/26/2012   Tape Hives 08/29/2019   Zetia [ezetimibe]  07/05/2018    Family History  Problem Relation Age of Onset   Alcohol abuse Mother    Heart disease Mother        CHF   Hypertension Mother    Hyperlipidemia Mother    Stroke Mother    Mental illness Mother    Alcohol abuse Father    Cancer Father        pancreatitic    Arthritis Sister    COPD Sister    Depression Sister    Drug abuse Sister    Hearing loss Sister    Heart disease Sister        CHF   Hyperlipidemia Sister    Hypertension Sister    Mental illness Sister    COPD Brother    Depression Brother    Heart disease Brother    Hyperlipidemia Brother    Stroke Brother    Asthma Son    Arthritis Brother    Depression Brother    Heart disease Brother    Hyperlipidemia Brother    Mental illness Brother    Mental illness Brother    Hyperlipidemia Brother    Drug abuse Brother    Diabetes Brother    Depression Brother    COPD Brother    Birth defects Brother    Arthritis Brother     Social History   Socioeconomic History   Marital status: Widowed    Spouse name: Not on file   Number of children: Not on file   Years of education: Not on file   Highest education level: Not on file  Occupational History   Not on file  Tobacco Use   Smoking status: Never   Smokeless tobacco: Never  Substance and Sexual Activity   Alcohol use: No   Drug use: No   Sexual activity: Not Currently  Other Topics Concern   Not on file  Social History Narrative   2 sons, 4 pregnancies    Brother is [name]    Never smoker exposed 2nd hand    No guns    Wears seat belt    Safe in relationship, widowed lives alone   2 year  college ed    Social Determinants of Health   Financial Resource Strain: Low Risk    Difficulty of Paying  Living Expenses: Not hard at all  Food Insecurity: No Food Insecurity   Worried About Charity fundraiser in the Last Year: Never true   Arboriculturist in the Last Year: Never true  Transportation Needs: No Transportation Needs   Lack of Transportation (Medical): No   Lack of Transportation (Non-Medical): No  Physical Activity: Sufficiently Active   Days of Exercise per Week: 4 days   Minutes of Exercise per Session: 40 min  Stress: No Stress Concern Present   Feeling of Stress : Not at all  Social Connections: Unknown   Frequency of Communication with Friends and Family: More than three times a week   Frequency of Social Gatherings with Friends and Family: Not on file   Attends Religious Services: Not on Electrical engineer or Organizations: Not on file   Attends Archivist Meetings: Not on file   Marital Status: Not on file  Intimate Partner Violence: Not At Risk   Fear of Current or Ex-Partner: No   Emotionally Abused: No   Physically Abused: No   Sexually Abused: No    Review of Systems: All other review of systems negative except as mentioned in the HPI.  Physical Exam: Vital signs BP 137/76   Pulse 87   Temp 98.9 F (37.2 C)   Ht 5\' 4"  (1.626 m)   Wt 190 lb (86.2 kg)   SpO2 98%   BMI 32.61 kg/m   General:   Alert,  Well-developed, pleasant and cooperative in NAD Lungs:  Clear throughout to auscultation.   Heart:  Regular rate and rhythm Abdomen:  Soft, nontender and nondistended.   Neuro/Psych:  Alert and cooperative. Normal mood and affect. A and O x 3  Jolly Mango, MD [facility]

## 2021-06-15 ENCOUNTER — Telehealth: Payer: Self-pay

## 2021-06-15 NOTE — Telephone Encounter (Signed)
Left message on answering machine. 

## 2021-06-16 ENCOUNTER — Encounter: Payer: Self-pay | Admitting: Gastroenterology

## 2021-06-16 DIAGNOSIS — G720 Drug-induced myopathy: Secondary | ICD-10-CM | POA: Insufficient documentation

## 2021-06-16 NOTE — Telephone Encounter (Signed)
From my 07/2020 visit:   Hyperlipidemia and ASCVD risk reduction: Controlled; current treatment: Repatha 140 mg Q14 days  Medications previously tried: (atorvastatin, pitavastatin, pravastatin, rosuvastatin, simvastatin, ezetimibe) - all resulted in severe bilateral muscle aches.  Continue current regimen at this time.    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   Catie Darnelle Maffucci, PharmD, [place], [place] Clinical Pharmacist [employer] [facility] [employer] [phone]

## 2021-06-21 ENCOUNTER — Other Ambulatory Visit: Payer: Self-pay | Admitting: Internal Medicine

## 2021-06-21 DIAGNOSIS — I10 Essential (primary) hypertension: Secondary | ICD-10-CM

## 2021-06-21 MED ORDER — AMLODIPINE BESYLATE 2.5 MG PO TABS: 2.5000 mg | ORAL_TABLET | Freq: Every day | ORAL | 2 refills | Status: DC

## 2021-08-31 ENCOUNTER — Encounter: Payer: Self-pay | Admitting: Internal Medicine

## 2021-08-31 ENCOUNTER — Ambulatory Visit (INDEPENDENT_AMBULATORY_CARE_PROVIDER_SITE_OTHER): Payer: Medicare Other | Admitting: Internal Medicine

## 2021-08-31 ENCOUNTER — Other Ambulatory Visit: Payer: Self-pay

## 2021-08-31 VITALS — BP 140/84 | HR 75 | Temp 98.0°F | Ht 64.0 in | Wt 184.4 lb

## 2021-08-31 DIAGNOSIS — I10 Essential (primary) hypertension: Secondary | ICD-10-CM | POA: Diagnosis not present

## 2021-08-31 DIAGNOSIS — K317 Polyp of stomach and duodenum: Secondary | ICD-10-CM

## 2021-08-31 DIAGNOSIS — E871 Hypo-osmolality and hyponatremia: Secondary | ICD-10-CM

## 2021-08-31 DIAGNOSIS — K648 Other hemorrhoids: Secondary | ICD-10-CM | POA: Diagnosis not present

## 2021-08-31 DIAGNOSIS — R7303 Prediabetes: Secondary | ICD-10-CM | POA: Diagnosis not present

## 2021-08-31 DIAGNOSIS — Z1231 Encounter for screening mammogram for malignant neoplasm of breast: Secondary | ICD-10-CM

## 2021-08-31 DIAGNOSIS — K635 Polyp of colon: Secondary | ICD-10-CM | POA: Diagnosis not present

## 2021-08-31 DIAGNOSIS — E785 Hyperlipidemia, unspecified: Secondary | ICD-10-CM | POA: Diagnosis not present

## 2021-08-31 DIAGNOSIS — E1159 Type 2 diabetes mellitus with other circulatory complications: Secondary | ICD-10-CM

## 2021-08-31 DIAGNOSIS — J4520 Mild intermittent asthma, uncomplicated: Secondary | ICD-10-CM

## 2021-08-31 DIAGNOSIS — J0120 Acute ethmoidal sinusitis, unspecified: Secondary | ICD-10-CM

## 2021-08-31 DIAGNOSIS — J3489 Other specified disorders of nose and nasal sinuses: Secondary | ICD-10-CM | POA: Diagnosis not present

## 2021-08-31 DIAGNOSIS — R0602 Shortness of breath: Secondary | ICD-10-CM

## 2021-08-31 DIAGNOSIS — K449 Diaphragmatic hernia without obstruction or gangrene: Secondary | ICD-10-CM | POA: Diagnosis not present

## 2021-08-31 DIAGNOSIS — I6529 Occlusion and stenosis of unspecified carotid artery: Secondary | ICD-10-CM

## 2021-08-31 DIAGNOSIS — K5790 Diverticulosis of intestine, part unspecified, without perforation or abscess without bleeding: Secondary | ICD-10-CM | POA: Diagnosis not present

## 2021-08-31 DIAGNOSIS — R040 Epistaxis: Secondary | ICD-10-CM | POA: Diagnosis not present

## 2021-08-31 DIAGNOSIS — I152 Hypertension secondary to endocrine disorders: Secondary | ICD-10-CM

## 2021-08-31 LAB — COMPREHENSIVE METABOLIC PANEL
ALT: 14 U/L (ref 0–35)
AST: 19 U/L (ref 0–37)
Albumin: 4.2 g/dL (ref 3.5–5.2)
Alkaline Phosphatase: 76 U/L (ref 39–117)
BUN: 12 mg/dL (ref 6–23)
CO2: 31 mEq/L (ref 19–32)
Calcium: 10.1 mg/dL (ref 8.4–10.5)
Chloride: 97 mEq/L (ref 96–112)
Creatinine, Ser: 0.95 mg/dL (ref 0.40–1.20)
GFR: 58.96 mL/min — ABNORMAL LOW (ref >60.00)
Glucose, Bld: 93 mg/dL (ref 70–99)
Potassium: 4.4 mEq/L (ref 3.5–5.1)
Sodium: 134 mEq/L — ABNORMAL LOW (ref 135–145)
Total Bilirubin: 0.5 mg/dL (ref 0.2–1.2)
Total Protein: 7.2 g/dL (ref 6.0–8.3)

## 2021-08-31 LAB — CBC WITH DIFFERENTIAL/PLATELET
Basophils Absolute: 0 10*3/uL (ref 0.0–0.1)
Basophils Relative: 1 % (ref 0.0–3.0)
Eosinophils Absolute: 0.1 10*3/uL (ref 0.0–0.7)
Eosinophils Relative: 2.7 % (ref 0.0–5.0)
HCT: 41.6 % (ref 36.0–46.0)
Hemoglobin: 13.5 g/dL (ref 12.0–15.0)
Lymphocytes Relative: 23.5 % (ref 12.0–46.0)
Lymphs Abs: 1 10*3/uL (ref 0.7–4.0)
MCHC: 32.5 g/dL (ref 30.0–36.0)
MCV: 87 fl (ref 78.0–100.0)
Monocytes Absolute: 0.6 10*3/uL (ref 0.1–1.0)
Monocytes Relative: 12.7 % — ABNORMAL HIGH (ref 3.0–12.0)
Neutro Abs: 2.6 10*3/uL (ref 1.4–7.7)
Neutrophils Relative %: 60.1 % (ref 43.0–77.0)
Platelets: 255 10*3/uL (ref 150.0–400.0)
RBC: 4.78 Mil/uL (ref 3.87–5.11)
RDW: 13.4 % (ref 11.5–15.5)
WBC: 4.3 10*3/uL (ref 4.0–10.5)

## 2021-08-31 LAB — LIPID PANEL
Cholesterol: 165 mg/dL (ref 0–200)
HDL: 66.7 mg/dL (ref >39.00)
LDL Cholesterol: 86 mg/dL (ref 0–99)
NonHDL: 98.01
Total CHOL/HDL Ratio: 2
Triglycerides: 62 mg/dL (ref 0.0–149.0)
VLDL: 12.4 mg/dL (ref 0.0–40.0)

## 2021-08-31 LAB — HEMOGLOBIN A1C: Hgb A1c MFr Bld: 6 % (ref 4.6–6.5)

## 2021-08-31 MED ORDER — BUDESONIDE-FORMOTEROL FUMARATE 160-4.5 MCG/ACT IN AERO: 2.0000 | INHALATION_SPRAY | Freq: Two times a day (BID) | RESPIRATORY_TRACT | 11 refills | Status: DC

## 2021-08-31 MED ORDER — SITAGLIPTIN PHOSPHATE 25 MG PO TABS: 25.0000 mg | ORAL_TABLET | Freq: Every day | ORAL | 3 refills | Status: DC

## 2021-08-31 MED ORDER — HYDRALAZINE HCL 50 MG PO TABS: 50.0000 mg | ORAL_TABLET | Freq: Three times a day (TID) | ORAL | 3 refills | Status: DC

## 2021-08-31 MED ORDER — GLUCOSE BLOOD VI STRP: ORAL_STRIP | 12 refills | Status: AC

## 2021-08-31 MED ORDER — AZILSARTAN-CHLORTHALIDONE 40-12.5 MG PO TABS: 1.0000 | ORAL_TABLET | Freq: Every morning | ORAL | 3 refills | Status: DC

## 2021-08-31 MED ORDER — AMLODIPINE BESYLATE 2.5 MG PO TABS: 2.5000 mg | ORAL_TABLET | Freq: Every day | ORAL | 3 refills | Status: DC

## 2021-08-31 MED ORDER — MUPIROCIN 2 % EX OINT: 1.0000 "application " | TOPICAL_OINTMENT | Freq: Two times a day (BID) | CUTANEOUS | 0 refills | Status: DC

## 2021-08-31 MED ORDER — AZITHROMYCIN 250 MG PO TABS: ORAL_TABLET | ORAL | 0 refills | Status: AC

## 2021-08-31 NOTE — Progress Notes (Addendum)
Chief Complaint  Patient presents with   Follow-up   F/u  1. Htn on norvasc 2.5 mg qd azilsartan chlorthalidone 40-12.5 mg qd hydralazine 50 tid controlled for age  76. C/o sinusitis unable to take antihistamines due to too groggy tried otc ns and echinechea and humidifier nose on the inside feels swollen    Review of Systems  Constitutional:  Negative for weight loss.  HENT:  Negative for hearing loss.   Eyes:  Negative for blurred vision.  Respiratory:  Negative for shortness of breath.   Cardiovascular:  Negative for chest pain.  Gastrointestinal:  Negative for abdominal pain and blood in stool.  Genitourinary:  Negative for dysuria.  Musculoskeletal:  Negative for falls and joint pain.  Skin:  Negative for rash.  Neurological:  Negative for headaches.  Psychiatric/Behavioral:  Negative for depression.   Past Medical History:  Diagnosis Date   Allergy    Arthritis    DDD cervical spine chronic neck pain    Asthma    with h/o chronic bronchitis   Back pain    mid back pain h/ o trauma    Diabetes mellitus without complication ([place])    Dysphagia    dilated x 2   Frequent headaches    Gastric ulcer    GERD (gastroesophageal reflux disease)    History of chicken pox    Hyperlipidemia    Hypertension    Migraines    Thyroid disease    mng    Past Surgical History:  Procedure Laterality Date   ABDOMINAL HYSTERECTOMY     DUB 1 ovary intact    CATARACT EXTRACTION     10/2020 and 12/2020   CHOLECYSTECTOMY     09/02/19   ESOPHAGEAL DILATION     x2   Family History  Problem Relation Age of Onset   Alcohol abuse Mother    Heart disease Mother        CHF   Hypertension Mother    Hyperlipidemia Mother    Stroke Mother    Mental illness Mother    Alcohol abuse Father    Cancer Father        pancreatitic    Arthritis Sister    COPD Sister    Depression Sister    Drug abuse Sister    Hearing loss Sister    Heart disease Sister        CHF   Hyperlipidemia Sister     Hypertension Sister    Mental illness Sister    COPD Brother    Depression Brother    Heart disease Brother    Hyperlipidemia Brother    Stroke Brother    Asthma Son    Arthritis Brother    Depression Brother    Heart disease Brother    Hyperlipidemia Brother    Mental illness Brother    Mental illness Brother    Hyperlipidemia Brother    Drug abuse Brother    Diabetes Brother    Depression Brother    COPD Brother    Birth defects Brother    Arthritis Brother    Social History   Socioeconomic History   Marital status: Widowed    Spouse name: Not on file   Number of children: Not on file   Years of education: Not on file   Highest education level: Not on file  Occupational History   Not on file  Tobacco Use   Smoking status: Never   Smokeless tobacco: Never  Substance  and Sexual Activity   Alcohol use: No   Drug use: No   Sexual activity: Not Currently  Other Topics Concern   Not on file  Social History Narrative   2 sons, 4 pregnancies    Brother is [name]    Never smoker exposed 2nd hand    No guns    Wears seat belt    Safe in relationship, widowed lives alone   2 year college ed    Social Determinants of Health   Financial Resource Strain: Low Risk    Difficulty of Paying Living Expenses: Not hard at all  Food Insecurity: No Food Insecurity   Worried About Charity fundraiser in the Last Year: Never true   Arboriculturist in the Last Year: Never true  Transportation Needs: No Transportation Needs   Lack of Transportation (Medical): No   Lack of Transportation (Non-Medical): No  Physical Activity: Sufficiently Active   Days of Exercise per Week: 4 days   Minutes of Exercise per Session: 40 min  Stress: No Stress Concern Present   Feeling of Stress : Not at all  Social Connections: Unknown   Frequency of Communication with Friends and Family: More than three times a week   Frequency of Social Gatherings with Friends and Family: Not on  file   Attends Religious Services: Not on Electrical engineer or Organizations: Not on file   Attends Archivist Meetings: Not on file   Marital Status: Not on file  Intimate Partner Violence: Not At Risk   Fear of Current or Ex-Partner: No   Emotionally Abused: No   Physically Abused: No   Sexually Abused: No   Current Meds  Medication Sig   albuterol (PROVENTIL HFA;VENTOLIN HFA) 108 (90 Base) MCG/ACT inhaler Inhale into the lungs.    aspirin EC 81 MG tablet Take 81 mg by mouth daily.   azithromycin (ZITHROMAX) 250 MG tablet With food Take 2 tablets on day 1, then 1 tablet daily on days 2 through 5   COLLAGEN PO Take by mouth.   Collagen-Vitamin C-Biotin (COLLAGEN 1500/C) 500-50-0.8 MG CAPS Take by mouth.   Evolocumab (REPATHA SURECLICK) 254 MG/ML SOAJ INJECT 140 MG INTO THE SKIN EVERY 14 (FOURTEEN) DAYS.   Insulin Pen Needle (PEN NEEDLES) 30G X 8 MM MISC 1 Device by Does not apply route every 14 (fourteen) days. For repatha or preferred pen needle dispense   ipratropium (ATROVENT) 0.06 % nasal spray Place 2 sprays into the nose 4 (four) times daily.   loratadine (CLARITIN) 10 MG tablet Take 1 tablet (10 mg total) by mouth daily as needed for allergies.   Multiple Vitamins-Minerals (MULTIVITAMIN WOMENS 50+ ADV PO) Take by mouth.   mupirocin ointment (BACTROBAN) 2 % Apply 1 application topically 2 (two) times daily.   ONETOUCH DELICA LANCETS FINE MISC USE TO CHECK BLOOD SUGAR 1-2 TIMES DAILY   Polyethyl Glycol-Propyl Glycol (SYSTANE FREE OP) Apply 1 drop to eye daily.   VITAMIN D PO Take 5,000 Units by mouth daily.    [DISCONTINUED] amLODipine (NORVASC) 2.5 MG tablet Take 1 tablet (2.5 mg total) by mouth daily.   [DISCONTINUED] Azilsartan-Chlorthalidone 40-12.5 MG TABS Take 1 tablet by mouth every morning. For blood   [DISCONTINUED] Azilsartan-Chlorthalidone 40-12.5 MG TABS Take 12.5 mg by mouth 1 day or 1 dose.   [DISCONTINUED] budesonide-formoterol (SYMBICORT)  160-4.5 MCG/ACT inhaler Inhale 2 puffs into the lungs 2 (two) times daily. Rinse mouth for asthma   [  DISCONTINUED] glucose blood test strip Check sugars 1-2 times daily. E11.9 Non-insulin dependent   [DISCONTINUED] hydrALAZINE (APRESOLINE) 50 MG tablet Take 1 tablet (50 mg total) by mouth 3 (three) times daily.   [DISCONTINUED] sitaGLIPtin (JANUVIA) 25 MG tablet Take 1 tablet (25 mg total) by mouth daily.   Allergies  Allergen Reactions   Alendronate Other (See Comments)    Stabbing pain in breast   Alprazolam Other (See Comments)    Made anxious   Amlodipine Other (See Comments)    Hives, Fell out, Black spots on face, Did not keep blood pressure down.    Atorvastatin Other (See Comments)   Benzalkonium Chloride Other (See Comments)    Made skin blister    Clindamycin Other (See Comments)    Pt states that pill that is blue and red she cannot take due feeling like she was on edge   Clonazepam Other (See Comments)    Headache    Clonidine Other (See Comments)    Hurt chest     Clopidogrel Other (See Comments)    Arms and legs like going to break in 1/2 per pt    Escitalopram Oxalate Other (See Comments)    Made anxious     Gabapentin Other (See Comments)    A bad headache     Hydrocodone-Acetaminophen Other (See Comments)    Made pain worse   Iodinated Contrast Media Swelling   Iodine Other (See Comments)    Allergic to shell fish and x-ray dye    Labetalol Other (See Comments)    Felt like veins are on fire    Levofloxacin Other (See Comments)    Made me shake   Lisinopril-Hydrochlorothiazide Other (See Comments)    Was in a blue and pale color    Livalo [Pitavastatin]     Rash and nausea    Lorazepam Other (See Comments)    Formula changed    Losartan Potassium Other (See Comments)    Blood pressure     Losartan Potassium-Hctz Other (See Comments)    Could not feel arms   Meloxicam Other (See Comments)    Fatal blood pressure problems will cause strokes     Metformin Nausea And Vomiting   Metoclopramide Other (See Comments)    No air was moving, anxious     Oxcarbazepine Other (See Comments)    Felt like she was chocking     Penicillins    Potassium Citrate    Pravastatin Other (See Comments)   Rosuvastatin Other (See Comments)   Shellfish Allergy Swelling   Simvastatin Other (See Comments)   Sitagliptin Other (See Comments)    "Made toes feel weird"   Sodium Citrate Other (See Comments)    Teeth broke off    Spironolactone     Breast pain     Sulfa Antibiotics Other (See Comments) and Itching   Tape Hives    Tape glue Tape glue   Wound Dressing Adhesive Hives   Zetia [Ezetimibe]     Muscle aches    No results found for this or any previous visit (from the past 2160 hour(s)). Objective  Body mass index is 31.65 kg/m. Wt Readings from Last 3 Encounters:  08/31/21 184 lb 6.4 oz (83.6 kg)  06/11/21 190 lb (86.2 kg)  05/03/21 190 lb (86.2 kg)   Temp Readings from Last 3 Encounters:  08/31/21 98 F (36.7 C) (Oral)  06/11/21 98.9 F (37.2 C)  02/25/21 97.7 F (36.5 C) (Temporal)   BP  Readings from Last 3 Encounters:  08/31/21 140/84  06/11/21 (!) 161/90  04/07/21 122/70   Pulse Readings from Last 3 Encounters:  08/31/21 75  06/11/21 73  04/07/21 82    Physical Exam Vitals and nursing note reviewed.  Constitutional:      Appearance: Normal appearance. She is well-developed and well-groomed.  HENT:     Head: Normocephalic and atraumatic.  Eyes:     Conjunctiva/sclera: Conjunctivae normal.     Pupils: Pupils are equal, round, and reactive to light.  Cardiovascular:     Rate and Rhythm: Normal rate and regular rhythm.     Heart sounds: Normal heart sounds. No murmur heard. Pulmonary:     Effort: Pulmonary effort is normal.     Breath sounds: Normal breath sounds.  Abdominal:     General: Abdomen is flat. Bowel sounds are normal.     Tenderness: There is no abdominal tenderness.  Musculoskeletal:         General: No tenderness.  Skin:    General: Skin is warm and dry.  Neurological:     General: No focal deficit present.     Mental Status: She is alert and oriented to person, place, and time. Mental status is at baseline.     Cranial Nerves: Cranial nerves 2-12 are intact.     Sensory: Sensation is intact.     Motor: Motor function is intact.     Coordination: Coordination is intact.     Gait: Gait is intact.  Psychiatric:        Attention and Perception: Attention and perception normal.        Mood and Affect: Mood and affect normal.        Speech: Speech normal.        Behavior: Behavior normal. Behavior is cooperative.        Thought Content: Thought content normal.        Cognition and Memory: Cognition and memory normal.        Judgment: Judgment normal.    Assessment  Plan  Bleeding from the nose - Plan: mupirocin ointment (BACTROBAN) 2 %  Acute ethmoidal sinusitis, recurrence not specified - Plan: azithromycin (ZITHROMAX) 250 MG tablet  Sore in nose - Plan: mupirocin ointment (BACTROBAN) 2 %  Gastric polyp Hiatal hernia Internal hemorrhoids Diverticulosis Polyp of colon, unspecified part of colon, unspecified type Incidental on colonoscopy   Screening mammogram, encounter for - Plan: MM 3D SCREEN BREAST BILATERAL due 05/2022  Hyponatremia - Plan: Comprehensive metabolic panel  Hypertension, unspecified type - Plan: Comprehensive metabolic panel, Lipid panel, CBC with Differential/Platelet With sob with exertion repeat echo prior in 2019 h/o grade 1 DD  Will order cT coronary arteries as well  H/o stroke and mild cAS 07/30/19 Carotid US   Hyperlipidemia, unspecified hyperlipidemia type - Plan: Lipid panel  Prediabetes - Plan: Hemoglobin A1c  Essential hypertension - Plan: amLODipine (NORVASC) 2.5 MG tablet, Azilsartan-Chlorthalidone 40-12.5 MG TABS, hydrALAZINE (APRESOLINE) 50 MG tablet tid  Hypertension associated with diabetes ([place]) - Plan: sitaGLIPtin  (JANUVIA) 25 MG tablet  Mild intermittent asthma, unspecified whether complicated - Plan: budesonide-formoterol (SYMBICORT) 160-4.5 MCG/ACT inhaler   HM Declines flu shot utd prevnar and pna 23  Rx Tdap given previously per pt had 2017 [facility] get record declines further Declines shingrix Pfizer 2/2 consider booster in future declines   LMP 07/1997 pap 12/09/14 neg pap neg HPV s/p hysterectomy no h/o abnormal pap 1 ovary intact ? Which one   Mammogram  fat necrosis right breast 06/14/18 and 10/09/18 normal  Repeat 05/07/20 negative GI breast center ordered 05/10/2021 neg ordered    Colonoscopy 05/18/16 tubular adenoma Dr. Jerene Pitch GI f/u in 5 years and FH colon cancer in dad Referred [facility] had 06/11/21 consider repeat in 5 years but will be age 60 so may be able to not do    DEXA 05/31/16 osteopenia consider repeat in 3-5 years vit D normal 85.5 07/07/17   05/07/20 osteopenia rec calcium and vitamin D   Hep C neg 07/07/17 Never smoker h/o 2nd hand exposure   US thyroid nodules/[facility] 06/14/18 ordered repeat due to abnormal F/u [facility] endocrine Dr. Buddy Duty 11/02/20    rec healthy diet and exercise  Provider: Dr. Olivia Mackie McLean-Scocuzza-Internal Medicine

## 2021-09-17 ENCOUNTER — Other Ambulatory Visit: Payer: Self-pay | Admitting: Internal Medicine

## 2021-09-17 DIAGNOSIS — I10 Essential (primary) hypertension: Secondary | ICD-10-CM

## 2021-09-17 MED ORDER — AMLODIPINE BESYLATE 5 MG PO TABS: 5.0000 mg | ORAL_TABLET | Freq: Every day | ORAL | 3 refills | Status: DC

## 2021-09-17 NOTE — Addendum Note (Signed)
Addended by: Orland Mustard on: 09/17/2021 04:52 PM   Modules accepted: Orders

## 2021-09-20 ENCOUNTER — Telehealth: Payer: Self-pay | Admitting: Internal Medicine

## 2021-09-20 DIAGNOSIS — I10 Essential (primary) hypertension: Secondary | ICD-10-CM

## 2021-09-20 NOTE — Telephone Encounter (Signed)
Yes mail order [facility] in [place] resend generic 5 mg norvasc   Thank you

## 2021-09-20 NOTE — Telephone Encounter (Signed)
Okay to call and inform pharmacy that generic can be filled?

## 2021-09-20 NOTE — Telephone Encounter (Signed)
Pt called in stating that the insurance will not cover the name brand medication of (amLODipine (NORVASC) 5 MG tablet). Pt is requesting for a new script of the generic brand to be sent to pharmacy. Pt requesting callback

## 2021-09-23 NOTE — Telephone Encounter (Addendum)
Medication has not been changed and pt is out of medication. Pt is requesting generic. Phone number to [place] [facility] [phone]

## 2021-09-24 MED ORDER — AMLODIPINE BESYLATE 5 MG PO TABS: 5.0000 mg | ORAL_TABLET | Freq: Every day | ORAL | 3 refills | Status: DC

## 2021-09-24 NOTE — Addendum Note (Signed)
Addended by: Nanci Pina on: 09/24/2021 10:02 AM   Modules accepted: Orders

## 2021-09-29 NOTE — Telephone Encounter (Signed)
Med was re-sent electronically with note saying to fill generic  ?

## 2021-09-30 ENCOUNTER — Other Ambulatory Visit: Payer: Self-pay | Admitting: Internal Medicine

## 2021-09-30 DIAGNOSIS — I10 Essential (primary) hypertension: Secondary | ICD-10-CM

## 2021-10-06 ENCOUNTER — Other Ambulatory Visit: Payer: Self-pay | Admitting: Internal Medicine

## 2021-10-06 ENCOUNTER — Ambulatory Visit (HOSPITAL_COMMUNITY)
Admission: RE | Admit: 2021-10-06 | Discharge: 2021-10-06 | Disposition: A | Discharge status: Home / Self Care | Payer: Medicare Other | Source: Ambulatory Visit | Attending: Internal Medicine | Admitting: Internal Medicine

## 2021-10-06 ENCOUNTER — Ambulatory Visit (HOSPITAL_BASED_OUTPATIENT_CLINIC_OR_DEPARTMENT_OTHER)
Admission: RE | Admit: 2021-10-06 | Discharge: 2021-10-06 | Disposition: A | Discharge status: Home / Self Care | Payer: Medicare Other | Source: Ambulatory Visit | Attending: Internal Medicine | Admitting: Internal Medicine

## 2021-10-06 ENCOUNTER — Other Ambulatory Visit: Payer: Self-pay

## 2021-10-06 DIAGNOSIS — E1159 Type 2 diabetes mellitus with other circulatory complications: Secondary | ICD-10-CM | POA: Diagnosis not present

## 2021-10-06 DIAGNOSIS — R0600 Dyspnea, unspecified: Secondary | ICD-10-CM | POA: Insufficient documentation

## 2021-10-06 DIAGNOSIS — I152 Hypertension secondary to endocrine disorders: Secondary | ICD-10-CM | POA: Diagnosis not present

## 2021-10-06 DIAGNOSIS — I10 Essential (primary) hypertension: Secondary | ICD-10-CM | POA: Diagnosis not present

## 2021-10-06 DIAGNOSIS — I6529 Occlusion and stenosis of unspecified carotid artery: Secondary | ICD-10-CM | POA: Insufficient documentation

## 2021-10-06 DIAGNOSIS — R0602 Shortness of breath: Secondary | ICD-10-CM | POA: Insufficient documentation

## 2021-10-06 LAB — ECHOCARDIOGRAM COMPLETE
Area-P 1/2: 3.37 cm2
S' Lateral: 2.7 cm

## 2021-10-06 MED ORDER — AMLODIPINE BESYLATE 5 MG PO TABS: 5.0000 mg | ORAL_TABLET | Freq: Every day | ORAL | 3 refills | Status: DC

## 2021-11-04 ENCOUNTER — Telehealth: Payer: Self-pay | Admitting: Internal Medicine

## 2021-11-04 NOTE — Telephone Encounter (Signed)
Pt need refill on amlodipine sent to [facility]. Pt want to change from 5 to 2.5 ? [phone] ? ?

## 2021-11-15 ENCOUNTER — Other Ambulatory Visit: Payer: Self-pay | Admitting: Internal Medicine

## 2021-11-15 DIAGNOSIS — I10 Essential (primary) hypertension: Secondary | ICD-10-CM

## 2021-11-15 MED ORDER — AMLODIPINE BESYLATE 2.5 MG PO TABS: 2.5000 mg | ORAL_TABLET | Freq: Two times a day (BID) | ORAL | 3 refills | Status: DC

## 2021-11-15 NOTE — Telephone Encounter (Signed)
Changed to 2.5 dose but if BP >130/>80 can take another 2.5 dose gave her enough pills to do this to [facility]  ? ?

## 2021-12-27 ENCOUNTER — Other Ambulatory Visit: Payer: Self-pay | Admitting: Internal Medicine

## 2021-12-27 DIAGNOSIS — J4520 Mild intermittent asthma, uncomplicated: Secondary | ICD-10-CM

## 2021-12-27 DIAGNOSIS — J309 Allergic rhinitis, unspecified: Secondary | ICD-10-CM

## 2022-02-25 ENCOUNTER — Encounter: Payer: Self-pay | Admitting: Pharmacist

## 2022-02-25 DIAGNOSIS — G720 Drug-induced myopathy: Secondary | ICD-10-CM

## 2022-02-25 NOTE — Progress Notes (Incomplete)
[facility])                                            [place] Team                                        Statin Quality Measure Assessment    02/25/2022  Marissa King [birth year] [id number]  Per review of chart and payor information, patient has a diagnosis of cardiovascular disease but is not currently filling a statin prescription.  This places patient into the [facility] (Statin Use In Patients with Cardiovascular Disease) measure for CMS.    Patient has documented allergy to statin but no corresponding CPT codes that would exclude patient from [facility] measure.  She has an upcoming appointment 03/01/22.  If deemed therapeutically appropriate, a statin exclusion code could be added to the upcoming visit.     Component Value Date/Time   CHOL 165 08/31/2021 1144   TRIG 62.0 08/31/2021 1144   HDL 66.70 08/31/2021 1144   CHOLHDL 2 08/31/2021 1144   VLDL 12.4 08/31/2021 1144   LDLCALC 86 08/31/2021 1144     Please consider ONE of the following recommendations:  Initiate high intensity statin Atorvastatin '40mg'$  once daily, #90, 3 refills   Rosuvastatin '20mg'$  once daily, #90, 3 refills    Initiate moderate intensity  statin with reduced frequency if prior  statin intolerance 1x weekly, #13, 3 refills   2x weekly, #26, 3 refills   3x weekly, #39, 3 refills    Code for past statin intolerance  (required annually)   Provider Requirements:  Must asociate code during an office visit or telehealth encounter   Drug Induced Myopathy G72.0   Myalgia M79.1   Myositis, unspecified M60.9   Myopathy, unspecified G72.9   Rhabdomyolysis M62.82     Plan:  Upon further review, it appears the patient may be in the SUPD gap as the [place], PharmD, [place] Clinical Pharmacist [phone]

## 2022-03-01 ENCOUNTER — Ambulatory Visit: Payer: Medicare Other | Admitting: Internal Medicine

## 2022-03-03 ENCOUNTER — Ambulatory Visit: Payer: Medicare Other | Admitting: Internal Medicine

## 2022-03-04 ENCOUNTER — Ambulatory Visit (INDEPENDENT_AMBULATORY_CARE_PROVIDER_SITE_OTHER): Payer: Medicare Other | Admitting: Internal Medicine

## 2022-03-04 ENCOUNTER — Telehealth: Payer: Self-pay

## 2022-03-04 ENCOUNTER — Encounter: Payer: Self-pay | Admitting: Internal Medicine

## 2022-03-04 VITALS — BP 132/84 | HR 77 | Temp 98.0°F | Ht 64.0 in | Wt 183.0 lb

## 2022-03-04 DIAGNOSIS — E7849 Other hyperlipidemia: Secondary | ICD-10-CM | POA: Diagnosis not present

## 2022-03-04 DIAGNOSIS — E1159 Type 2 diabetes mellitus with other circulatory complications: Secondary | ICD-10-CM

## 2022-03-04 DIAGNOSIS — I6529 Occlusion and stenosis of unspecified carotid artery: Secondary | ICD-10-CM

## 2022-03-04 DIAGNOSIS — I10 Essential (primary) hypertension: Secondary | ICD-10-CM | POA: Diagnosis not present

## 2022-03-04 DIAGNOSIS — I152 Hypertension secondary to endocrine disorders: Secondary | ICD-10-CM | POA: Diagnosis not present

## 2022-03-04 DIAGNOSIS — G720 Drug-induced myopathy: Secondary | ICD-10-CM | POA: Diagnosis not present

## 2022-03-04 DIAGNOSIS — M791 Myalgia, unspecified site: Secondary | ICD-10-CM

## 2022-03-04 DIAGNOSIS — T466X5A Adverse effect of antihyperlipidemic and antiarteriosclerotic drugs, initial encounter: Secondary | ICD-10-CM

## 2022-03-04 DIAGNOSIS — J4520 Mild intermittent asthma, uncomplicated: Secondary | ICD-10-CM | POA: Diagnosis not present

## 2022-03-04 DIAGNOSIS — J309 Allergic rhinitis, unspecified: Secondary | ICD-10-CM

## 2022-03-04 DIAGNOSIS — E785 Hyperlipidemia, unspecified: Secondary | ICD-10-CM | POA: Diagnosis not present

## 2022-03-04 LAB — CBC WITH DIFFERENTIAL/PLATELET
Basophils Absolute: 0 10*3/uL (ref 0.0–0.1)
Basophils Relative: 0.6 % (ref 0.0–3.0)
Eosinophils Absolute: 0 10*3/uL (ref 0.0–0.7)
Eosinophils Relative: 1.1 % (ref 0.0–5.0)
HCT: 41.9 % (ref 36.0–46.0)
Hemoglobin: 14.1 g/dL (ref 12.0–15.0)
Lymphocytes Relative: 26.7 % (ref 12.0–46.0)
Lymphs Abs: 1.2 10*3/uL (ref 0.7–4.0)
MCHC: 33.6 g/dL (ref 30.0–36.0)
MCV: 86.9 fl (ref 78.0–100.0)
Monocytes Absolute: 0.5 10*3/uL (ref 0.1–1.0)
Monocytes Relative: 10.6 % (ref 3.0–12.0)
Neutro Abs: 2.7 10*3/uL (ref 1.4–7.7)
Neutrophils Relative %: 61 % (ref 43.0–77.0)
Platelets: 238 10*3/uL (ref 150.0–400.0)
RBC: 4.83 Mil/uL (ref 3.87–5.11)
RDW: 13.7 % (ref 11.5–15.5)
WBC: 4.4 10*3/uL (ref 4.0–10.5)

## 2022-03-04 LAB — COMPREHENSIVE METABOLIC PANEL
ALT: 13 U/L (ref 0–35)
AST: 20 U/L (ref 0–37)
Albumin: 4.2 g/dL (ref 3.5–5.2)
Alkaline Phosphatase: 78 U/L (ref 39–117)
BUN: 9 mg/dL (ref 6–23)
CO2: 28 mEq/L (ref 19–32)
Calcium: 9.6 mg/dL (ref 8.4–10.5)
Chloride: 95 mEq/L — ABNORMAL LOW (ref 96–112)
Creatinine, Ser: 0.93 mg/dL (ref 0.40–1.20)
GFR: 60.27 mL/min (ref >60.00)
Glucose, Bld: 95 mg/dL (ref 70–99)
Potassium: 4 mEq/L (ref 3.5–5.1)
Sodium: 131 mEq/L — ABNORMAL LOW (ref 135–145)
Total Bilirubin: 0.6 mg/dL (ref 0.2–1.2)
Total Protein: 7.3 g/dL (ref 6.0–8.3)

## 2022-03-04 LAB — LIPID PANEL
Cholesterol: 168 mg/dL (ref 0–200)
HDL: 63.3 mg/dL (ref >39.00)
LDL Cholesterol: 91 mg/dL (ref 0–99)
NonHDL: 104.39
Total CHOL/HDL Ratio: 3
Triglycerides: 65 mg/dL (ref 0.0–149.0)
VLDL: 13 mg/dL (ref 0.0–40.0)

## 2022-03-04 LAB — HEMOGLOBIN A1C: Hgb A1c MFr Bld: 6.4 % (ref 4.6–6.5)

## 2022-03-04 MED ORDER — AMLODIPINE BESYLATE 2.5 MG PO TABS: 2.5000 mg | ORAL_TABLET | Freq: Two times a day (BID) | ORAL | 3 refills | Status: DC

## 2022-03-04 MED ORDER — AZILSARTAN-CHLORTHALIDONE 40-12.5 MG PO TABS: 1.0000 | ORAL_TABLET | Freq: Every morning | ORAL | 3 refills | Status: DC

## 2022-03-04 MED ORDER — REPATHA SURECLICK 140 MG/ML ~~LOC~~ SOAJ: SUBCUTANEOUS | 11 refills | Status: DC

## 2022-03-04 MED ORDER — SITAGLIPTIN PHOSPHATE 25 MG PO TABS: 25.0000 mg | ORAL_TABLET | Freq: Every day | ORAL | 3 refills | Status: DC

## 2022-03-04 MED ORDER — HYDRALAZINE HCL 50 MG PO TABS: 50.0000 mg | ORAL_TABLET | Freq: Three times a day (TID) | ORAL | 3 refills | Status: DC

## 2022-03-04 MED ORDER — IPRATROPIUM BROMIDE 0.06 % NA SOLN: 2.0000 | Freq: Four times a day (QID) | NASAL | 4 refills | Status: AC

## 2022-03-04 MED ORDER — LORATADINE 10 MG PO TABS: 10.0000 mg | ORAL_TABLET | Freq: Every day | ORAL | 3 refills | Status: DC | PRN

## 2022-03-04 NOTE — Patient Instructions (Addendum)
Dr. Billey Gosling call for appt transfer of care in 6 months  Phone Fax E-mail Address  [phone] Not available [place] [address]     Specialties     Internal Medicine        Next bone density with your next mammogram in 2024  Prevar 20 due 02/09/23    Pneumococcal Conjugate Vaccine (Prevnar 20) Suspension for Injection What is this medication? PNEUMOCOCCAL VACCINE (NEU mo KOK al vak SEEN) is a vaccine. It prevents pneumococcus bacterial infections. These bacteria can cause serious infections like pneumonia, meningitis, and blood infections. This vaccine will not treat an infection and will not cause infection. This vaccine is recommended for adults 18 years and older. This medicine may be used for other purposes; ask your health care provider or pharmacist if you have questions. COMMON BRAND NAME(S): Prevnar 20 What should I tell my care team before I take this medication? They need to know if you have any of these conditions: bleeding disorder fever immune system problems an unusual or allergic reaction to pneumococcal vaccine, diphtheria toxoid, other vaccines, other medicines, foods, dyes, or preservatives pregnant or trying to get pregnant breast-feeding How should I use this medication? This vaccine is injected into a muscle. It is given by a health care provider. A copy of Vaccine Information Statements will be given before each vaccination. Be sure to read this information carefully each time. This sheet may change often. Talk to your health care provider about the use of this medicine in children. Special care may be needed. Overdosage: If you think you have taken too much of this medicine contact a poison control center or emergency room at once. NOTE: This medicine is only for you. Do not share this medicine with others. What if I miss a dose? This does not apply. This medicine is not for regular use. What may interact with this  medication? medicines for cancer chemotherapy medicines that suppress your immune function steroid medicines like prednisone or cortisone This list may not describe all possible interactions. Give your health care provider a list of all the medicines, herbs, non-prescription drugs, or dietary supplements you use. Also tell them if you smoke, drink alcohol, or use illegal drugs. Some items may interact with your medicine. What should I watch for while using this medication? Mild fever and pain should go away in 3 days or less. Report any unusual symptoms to your health care provider. What side effects may I notice from receiving this medication? Side effects that you should report to your doctor or health care professional as soon as possible: allergic reactions (skin rash, itching or hives; swelling of the face, lips, or tongue) confusion fast, irregular heartbeat fever over 102 degrees F muscle weakness seizures trouble breathing unusual bruising or bleeding Side effects that usually do not require medical attention (report to your doctor or health care professional if they continue or are bothersome): fever of 102 degrees F or less headache joint pain muscle cramps, pain pain, tender at site where injected This list may not describe all possible side effects. Call your doctor for medical advice about side effects. You may report side effects to FDA at 1-800-FDA-1088. Where should I keep my medication? This vaccine is only given by a health care provider. It will not be stored at home. NOTE: This sheet is a summary. It may not cover all possible information. If you have questions about this medicine, talk to your doctor, pharmacist,  or health care provider.  2023 Elsevier/Gold Standard (2020-03-20 00:00:00)

## 2022-03-04 NOTE — Progress Notes (Signed)
Chief Complaint  Patient presents with   Follow-up    6 month f/u   F/u  1. Htn controlled norvasc 2.5 mg bid and azilasartan-chlorthalidone 40-12.5 mg qd controlled     Review of Systems  Constitutional:  Negative for weight loss.  HENT:  Negative for hearing loss.   Eyes:  Negative for blurred vision.  Respiratory:  Negative for shortness of breath.   Cardiovascular:  Negative for chest pain.  Gastrointestinal:  Negative for abdominal pain and blood in stool.  Genitourinary:  Negative for dysuria.  Musculoskeletal:  Negative for falls and joint pain.  Skin:  Negative for rash.  Neurological:  Negative for headaches.  Psychiatric/Behavioral:  Negative for depression.    Past Medical History:  Diagnosis Date   Allergy    Arthritis    DDD cervical spine chronic neck pain    Asthma    with h/o chronic bronchitis   Back pain    mid back pain h/ o trauma    Diabetes mellitus without complication ([place])    Dysphagia    dilated x 2   Frequent headaches    Gastric ulcer    GERD (gastroesophageal reflux disease)    History of chicken pox    Hyperlipidemia    Hypertension    Migraines    Thyroid disease    mng    Past Surgical History:  Procedure Laterality Date   ABDOMINAL HYSTERECTOMY     DUB 1 ovary intact    CATARACT EXTRACTION     10/2020 and 12/2020   CHOLECYSTECTOMY     09/02/19   ESOPHAGEAL DILATION     x2   Family History  Problem Relation Age of Onset   Alcohol abuse Mother    Heart disease Mother        CHF   Hypertension Mother    Hyperlipidemia Mother    Stroke Mother    Mental illness Mother    Alcohol abuse Father    Cancer Father        pancreatitic    Arthritis Sister    COPD Sister    Depression Sister    Drug abuse Sister    Hearing loss Sister    Heart disease Sister        CHF   Hyperlipidemia Sister    Hypertension Sister    Mental illness Sister    COPD Brother    Depression Brother    Heart disease Brother    Hyperlipidemia  Brother    Stroke Brother    Asthma Son    Arthritis Brother    Depression Brother    Heart disease Brother    Hyperlipidemia Brother    Mental illness Brother    Mental illness Brother    Hyperlipidemia Brother    Drug abuse Brother    Diabetes Brother    Depression Brother    COPD Brother    Birth defects Brother    Arthritis Brother    Social History   Socioeconomic History   Marital status: Widowed    Spouse name: Not on file   Number of children: Not on file   Years of education: Not on file   Highest education level: Not on file  Occupational History   Not on file  Tobacco Use   Smoking status: Never   Smokeless tobacco: Never  Substance and Sexual Activity   Alcohol use: No   Drug use: No   Sexual activity: Not Currently  Other Topics  Concern   Not on file  Social History Narrative   2 sons, 4 pregnancies    Brother is [name]    Never smoker exposed 2nd hand    No guns    Wears seat belt    Safe in relationship, widowed lives alone   2 year college ed    Social Determinants of Health   Financial Resource Strain: Low Risk  (05/03/2021)   Overall Financial Resource Strain (CARDIA)    Difficulty of Paying Living Expenses: Not hard at all  Food Insecurity: No Food Insecurity (05/03/2021)   Hunger Vital Sign    Worried About Running Out of Food in the Last Year: Never true    [place] in the Last Year: Never true  Transportation Needs: No Transportation Needs (05/03/2021)   PRAPARE - Hydrologist (Medical): No    Lack of Transportation (Non-Medical): No  Physical Activity: Sufficiently Active (05/03/2021)   Exercise Vital Sign    Days of Exercise per Week: 4 days    Minutes of Exercise per Session: 40 min  Stress: No Stress Concern Present (05/03/2021)   [place]    Feeling of Stress : Not at all  Social Connections: Unknown (05/03/2021)   Social  Connection and Isolation Panel [NHANES]    Frequency of Communication with Friends and Family: More than three times a week    Frequency of Social Gatherings with Friends and Family: Not on file    Attends Religious Services: Not on file    Active Member of Clubs or Organizations: Not on file    Attends Archivist Meetings: Not on file    Marital Status: Not on file  Intimate Partner Violence: Not At Risk (05/03/2021)   Humiliation, Afraid, Rape, and Kick questionnaire    Fear of Current or Ex-Partner: No    Emotionally Abused: No    Physically Abused: No    Sexually Abused: No   Current Meds  Medication Sig   albuterol (PROVENTIL HFA;VENTOLIN HFA) 108 (90 Base) MCG/ACT inhaler Inhale into the lungs.    aspirin EC 81 MG tablet Take 81 mg by mouth daily.   budesonide-formoterol (SYMBICORT) 160-4.5 MCG/ACT inhaler Inhale 2 puffs into the lungs 2 (two) times daily. Rinse mouth for asthma   COLLAGEN PO Take by mouth.   Collagen-Vitamin C-Biotin (COLLAGEN 1500/C) 500-50-0.8 MG CAPS Take by mouth.   glucose blood test strip Check sugars 1-2 times daily. E11.9 Non-insulin dependent   Insulin Pen Needle (PEN NEEDLES) 30G X 8 MM MISC 1 Device by Does not apply route every 14 (fourteen) days. For repatha or preferred pen needle dispense   Multiple Vitamins-Minerals (MULTIVITAMIN WOMENS 50+ ADV PO) Take by mouth.   ONETOUCH DELICA LANCETS FINE MISC USE TO CHECK BLOOD SUGAR 1-2 TIMES DAILY   Polyethyl Glycol-Propyl Glycol (SYSTANE FREE OP) Apply 1 drop to eye daily.   VITAMIN D PO Take 5,000 Units by mouth daily.    [DISCONTINUED] amLODipine (NORVASC) 2.5 MG tablet Take 1 tablet (2.5 mg total) by mouth in the morning and at bedtime. Generic ok. D/c 5 mg dose. Take 1 2.5 daily if BP >130/>80 take another dose   [DISCONTINUED] Azilsartan-Chlorthalidone 40-12.5 MG TABS Take 1 tablet by mouth every morning. For blood   [DISCONTINUED] Evolocumab (REPATHA SURECLICK) 301 MG/ML SOAJ INJECT 140 MG  INTO THE SKIN EVERY 14 (FOURTEEN) DAYS.   [DISCONTINUED] hydrALAZINE (APRESOLINE) 50 MG  tablet Take 1 tablet (50 mg total) by mouth 3 (three) times daily.   [DISCONTINUED] ipratropium (ATROVENT) 0.06 % nasal spray PLACE 2 SPRAYS INTO THE NOSE 4 (FOUR) TIMES DAILY.   [DISCONTINUED] loratadine (CLARITIN) 10 MG tablet Take 1 tablet (10 mg total) by mouth daily as needed for allergies.   [DISCONTINUED] mupirocin ointment (BACTROBAN) 2 % Apply 1 application topically 2 (two) times daily.   [DISCONTINUED] sitaGLIPtin (JANUVIA) 25 MG tablet Take 1 tablet (25 mg total) by mouth daily.   Allergies  Allergen Reactions   Alendronate Other (See Comments)    Stabbing pain in breast   Alprazolam Other (See Comments)    Made anxious   Amlodipine Other (See Comments)    Hives, Fell out, Black spots on face, Did not keep blood pressure down.    Atorvastatin Other (See Comments)   Benzalkonium Chloride Other (See Comments)    Made skin blister    Clindamycin Other (See Comments)    Pt states that pill that is blue and red she cannot take due feeling like she was on edge   Clonazepam Other (See Comments)    Headache    Clonidine Other (See Comments)    Hurt chest     Clopidogrel Other (See Comments)    Arms and legs like going to break in 1/2 per pt    Escitalopram Oxalate Other (See Comments)    Made anxious     Gabapentin Other (See Comments)    A bad headache     Hydrocodone-Acetaminophen Other (See Comments)    Made pain worse   Iodinated Contrast Media Swelling   Iodine Other (See Comments)    Allergic to shell fish and x-ray dye    Labetalol Other (See Comments)    Felt like veins are on fire    Levofloxacin Other (See Comments)    Made me shake   Lisinopril-Hydrochlorothiazide Other (See Comments)    Was in a blue and pale color    Livalo [Pitavastatin]     Rash and nausea    Lorazepam Other (See Comments)    Formula changed    Losartan Potassium Other (See Comments)     Blood pressure     Losartan Potassium-Hctz Other (See Comments)    Could not feel arms   Meloxicam Other (See Comments)    Fatal blood pressure problems will cause strokes    Metformin Nausea And Vomiting   Metoclopramide Other (See Comments)    No air was moving, anxious     Mupirocin Hives   Oxcarbazepine Other (See Comments)    Felt like she was chocking     Penicillins    Potassium Citrate    Pravastatin Other (See Comments)   Rosuvastatin Other (See Comments)   Shellfish Allergy Swelling   Simvastatin Other (See Comments)   Sitagliptin Other (See Comments)    "Made toes feel weird"   Sodium Citrate Other (See Comments)    Teeth broke off    Spironolactone     Breast pain     Sulfa Antibiotics Other (See Comments) and Itching   Tape Hives    Tape glue Tape glue   Wound Dressing Adhesive Hives   Zetia [Ezetimibe]     Muscle aches    No results found for this or any previous visit (from the past 2160 hour(s)). Objective  Body mass index is 31.41 kg/m. Wt Readings from Last 3 Encounters:  03/04/22 183 lb (83 kg)  08/31/21 184 lb 6.4  oz (83.6 kg)  06/11/21 190 lb (86.2 kg)   Temp Readings from Last 3 Encounters:  03/04/22 98 F (36.7 C) (Oral)  08/31/21 98 F (36.7 C) (Oral)  06/11/21 98.9 F (37.2 C)   BP Readings from Last 3 Encounters:  03/04/22 132/84  08/31/21 140/84  06/11/21 (!) 161/90   Pulse Readings from Last 3 Encounters:  03/04/22 77  08/31/21 75  06/11/21 73    Physical Exam Vitals and nursing note reviewed.  Constitutional:      Appearance: Normal appearance. She is well-developed and well-groomed.  HENT:     Head: Normocephalic and atraumatic.  Eyes:     Conjunctiva/sclera: Conjunctivae normal.     Pupils: Pupils are equal, round, and reactive to light.  Cardiovascular:     Rate and Rhythm: Normal rate and regular rhythm.     Heart sounds: Normal heart sounds. No murmur heard. Pulmonary:     Effort: Pulmonary effort is  normal.     Breath sounds: Normal breath sounds.  Abdominal:     General: Abdomen is flat. Bowel sounds are normal.     Tenderness: There is no abdominal tenderness.  Musculoskeletal:        General: No tenderness.  Skin:    General: Skin is warm and dry.  Neurological:     General: No focal deficit present.     Mental Status: She is alert and oriented to person, place, and time. Mental status is at baseline.     Cranial Nerves: Cranial nerves 2-12 are intact.     Motor: Motor function is intact.     Coordination: Coordination is intact.     Gait: Gait is intact.  Psychiatric:        Attention and Perception: Attention and perception normal.        Mood and Affect: Mood and affect normal.        Speech: Speech normal.        Behavior: Behavior normal. Behavior is cooperative.        Thought Content: Thought content normal.        Cognition and Memory: Cognition and memory normal.        Judgment: Judgment normal.     Assessment  Plan  Essential hypertension controlled - Plan: amLODipine (NORVASC) 2.5 MG tablet, Azilsartan-Chlorthalidone 40-12.5 MG TABS, Evolocumab (REPATHA SURECLICK) 759 MG/ML SOAJ, hydrALAZINE (APRESOLINE) 50 MG tablet  Hypertension associated with diabetes ([place]) - Plan: Evolocumab (REPATHA SURECLICK) 163 MG/ML SOAJ, sitaGLIPtin (JANUVIA) 25 MG tablet, Comprehensive metabolic panel, CBC with Differential/Platelet, Lipid panel, Hemoglobin A1c  Hyperlipidemia, unspecified hyperlipidemia type - Plan: Evolocumab (REPATHA SURECLICK) 846 MG/ML SOAJ Familial hyperlipidemia - Plan: Evolocumab (REPATHA SURECLICK) 659 MG/ML SOAJ  Stenosis of carotid artery, unspecified laterality - Plan: Evolocumab (REPATHA SURECLICK) 935 MG/ML SOAJ  Mild intermittent asthma, unspecified whether complicated - Plan: ipratropium (ATROVENT) 0.06 % nasal spray, loratadine (CLARITIN) 10 MG tablet  Allergic rhinitis, unspecified seasonality, unspecified trigger - Plan: ipratropium (ATROVENT)  0.06 % nasal spray, loratadine (CLARITIN) 10 MG tablet   HM Declines flu shot utd prevnar and pna 23 Consider prevnar 20 02/09/23  Rx Tdap given previously per pt had 2017 [facility] get record declines further Declines shingrix Pfizer 2/2 consider booster in future declines   LMP 07/1997 pap 12/09/14 neg pap neg HPV s/p hysterectomy no h/o abnormal pap 1 ovary intact ? Which one   Mammogram fat necrosis right breast 06/14/18 and 10/09/18 normal  Repeat 05/07/20 negative GI breast center ordered  05/10/2021 neg ordered  Sch 05/12/22    Colonoscopy 05/18/16 tubular adenoma Dr. Jerene Pitch GI f/u in 5 years and FH colon cancer in dad Referred [facility] had 06/11/21 consider repeat in 5 years but will be age 87 so may be able to not do    DEXA 05/31/16 osteopenia consider repeat in 3-5 years vit D normal 85.5 07/07/17   05/07/20 osteopenia rec calcium and vitamin D  rec due 05/2023   Hep C neg 07/07/17 Never smoker h/o 2nd hand exposure   US thyroid nodules/[facility] 06/14/18 ordered repeat due to abnormal F/u [facility] endocrine Dr. Buddy Duty 11/02/20 rec thyroid removal but pt states she cant me off aspirin 81 mg qd     rec healthy diet and exercise  Provider: Dr. Olivia Mackie McLean-Scocuzza-Internal Medicine

## 2022-03-04 NOTE — Telephone Encounter (Signed)
LMOM in regards to labs

## 2022-03-11 ENCOUNTER — Telehealth: Payer: Self-pay

## 2022-03-11 NOTE — Telephone Encounter (Signed)
Yes ok 

## 2022-03-11 NOTE — Telephone Encounter (Signed)
Pt has called and stated that Dr. Terese Door has referred her to become a pt here with Dr. Quay Burow.  Contact pt at [phone] to make this apptmnt if Dr. Quay Burow is ok with her coming on as a pt.

## 2022-03-14 NOTE — Telephone Encounter (Signed)
Message left for patient to return call to office to schedule.  If she calls back please offer her first available appointment in our 11:00 am slot.

## 2022-03-23 DIAGNOSIS — M791 Myalgia, unspecified site: Secondary | ICD-10-CM | POA: Insufficient documentation

## 2022-05-04 ENCOUNTER — Ambulatory Visit (INDEPENDENT_AMBULATORY_CARE_PROVIDER_SITE_OTHER): Payer: Medicare Other

## 2022-05-04 VITALS — Ht 64.0 in | Wt 183.0 lb

## 2022-05-04 DIAGNOSIS — Z0000 Encounter for general adult medical examination without abnormal findings: Secondary | ICD-10-CM | POA: Diagnosis not present

## 2022-05-04 NOTE — Patient Instructions (Addendum)
Marissa King , Thank you for taking time to come for your Medicare Wellness Visit. I appreciate your ongoing commitment to your health goals. Please review the following plan we discussed and let me know if I can assist you in the future.   These are the goals we discussed:  Goals       Patient Stated     Follow up with Primary Care Provider (pt-stated)      Keep all routine scheduled appointments.        This is a list of the screening recommended for you and due dates:  Health Maintenance  Topic Date Due   Yearly kidney health urinalysis for diabetes  08/01/2022*   Complete foot exam   08/01/2022*   Tetanus Vaccine  08/31/2022*   Hemoglobin A1C  09/04/2022   Yearly kidney function blood test for diabetes  03/05/2023   Pneumonia Vaccine  Completed   DEXA scan (bone density measurement)  Completed   Hepatitis C Screening: USPSTF Recommendation to screen - Ages 101-79 yo.  Completed   HPV Vaccine  Aged Out   Flu Shot  Discontinued   Eye exam for diabetics  Discontinued   Colon Cancer Screening  Discontinued   COVID-19 Vaccine  Discontinued   Zoster (Shingles) Vaccine  Discontinued  *Topic was postponed. The date shown is not the original due date.    Advanced directives:   Conditions/risks identified: none new at this time  Next appointment: Follow up in one year for your annual wellness visit    Preventive Care 65 Years and Older, Female Preventive care refers to lifestyle choices and visits with your health care provider that can promote health and wellness. What does preventive care include? A yearly physical exam. This is also called an annual well check. Dental exams once or twice a year. Routine eye exams. Ask your health care provider how often you should have your eyes checked. Personal lifestyle choices, including: Daily care of your teeth and gums. Regular physical activity. Eating a healthy diet. Avoiding tobacco and drug use. Limiting alcohol  use. Practicing safe sex. Taking low-dose aspirin every day. Taking vitamin and mineral supplements as recommended by your health care provider. What happens during an annual well check? The services and screenings done by your health care provider during your annual well check will depend on your age, overall health, lifestyle risk factors, and family history of disease. Counseling  Your health care provider may ask you questions about your: Alcohol use. Tobacco use. Drug use. Emotional well-being. Home and relationship well-being. Sexual activity. Eating habits. History of falls. Memory and ability to understand (cognition). Work and work Statistician. Reproductive health. Screening  You may have the following tests or measurements: Height, weight, and BMI. Blood pressure. Lipid and cholesterol levels. These may be checked every 5 years, or more frequently if you are over 47 years old. Skin check. Lung cancer screening. You may have this screening every year starting at age 56 if you have a 30-pack-year history of smoking and currently smoke or have quit within the past 15 years. Fecal occult blood test (FOBT) of the stool. You may have this test every year starting at age 38. Flexible sigmoidoscopy or colonoscopy. You may have a sigmoidoscopy every 5 years or a colonoscopy every 10 years starting at age 15. Hepatitis C blood test. Hepatitis B blood test. Sexually transmitted disease (STD) testing. Diabetes screening. This is done by checking your blood sugar (glucose) after you have not eaten for  a while (fasting). You may have this done every 1-3 years. Bone density scan. This is done to screen for osteoporosis. You may have this done starting at age 41. Mammogram. This may be done every 1-2 years. Talk to your health care provider about how often you should have regular mammograms. Talk with your health care provider about your test results, treatment options, and if necessary,  the need for more tests. Vaccines  Your health care provider may recommend certain vaccines, such as: Influenza vaccine. This is recommended every year. Tetanus, diphtheria, and acellular pertussis (Tdap, Td) vaccine. You may need a Td booster every 10 years. Zoster vaccine. You may need this after age 63. Pneumococcal 13-valent conjugate (PCV13) vaccine. One dose is recommended after age 59. Pneumococcal polysaccharide (PPSV23) vaccine. One dose is recommended after age 83. Talk to your health care provider about which screenings and vaccines you need and how often you need them. This information is not intended to replace advice given to you by your health care provider. Make sure you discuss any questions you have with your health care provider. Document Released: 08/14/2015 Document Revised: 04/06/2016 Document Reviewed: 05/19/2015 Elsevier Interactive Patient Education  2017 [place] Prevention in the Home Falls can cause injuries. They can happen to people of all ages. There are many things you can do to make your home safe and to help prevent falls. What can I do on the outside of my home? Regularly fix the edges of walkways and driveways and fix any cracks. Remove anything that might make you trip as you walk through a door, such as a raised step or threshold. Trim any bushes or trees on the path to your home. Use bright outdoor lighting. Clear any walking paths of anything that might make someone trip, such as rocks or tools. Regularly check to see if handrails are loose or broken. Make sure that both sides of any steps have handrails. Any raised decks and porches should have guardrails on the edges. Have any leaves, snow, or ice cleared regularly. Use sand or salt on walking paths during winter. Clean up any spills in your garage right away. This includes oil or grease spills. What can I do in the bathroom? Use night lights. Install grab bars by the toilet and in the  tub and shower. Do not use towel bars as grab bars. Use non-skid mats or decals in the tub or shower. If you need to sit down in the shower, use a plastic, non-slip stool. Keep the floor dry. Clean up any water that spills on the floor as soon as it happens. Remove soap buildup in the tub or shower regularly. Attach bath mats securely with double-sided non-slip rug tape. Do not have throw rugs and other things on the floor that can make you trip. What can I do in the bedroom? Use night lights. Make sure that you have a light by your bed that is easy to reach. Do not use any sheets or blankets that are too big for your bed. They should not hang down onto the floor. Have a firm chair that has side arms. You can use this for support while you get dressed. Do not have throw rugs and other things on the floor that can make you trip. What can I do in the kitchen? Clean up any spills right away. Avoid walking on wet floors. Keep items that you use a lot in easy-to-reach places. If you need to reach something above  you, use a strong step stool that has a grab bar. Keep electrical cords out of the way. Do not use floor polish or wax that makes floors slippery. If you must use wax, use non-skid floor wax. Do not have throw rugs and other things on the floor that can make you trip. What can I do with my stairs? Do not leave any items on the stairs. Make sure that there are handrails on both sides of the stairs and use them. Fix handrails that are broken or loose. Make sure that handrails are as long as the stairways. Check any carpeting to make sure that it is firmly attached to the stairs. Fix any carpet that is loose or worn. Avoid having throw rugs at the top or bottom of the stairs. If you do have throw rugs, attach them to the floor with carpet tape. Make sure that you have a light switch at the top of the stairs and the bottom of the stairs. If you do not have them, ask someone to add them for  you. What else can I do to help prevent falls? Wear shoes that: Do not have high heels. Have rubber bottoms. Are comfortable and fit you well. Are closed at the toe. Do not wear sandals. If you use a stepladder: Make sure that it is fully opened. Do not climb a closed stepladder. Make sure that both sides of the stepladder are locked into place. Ask someone to hold it for you, if possible. Clearly mark and make sure that you can see: Any grab bars or handrails. First and last steps. Where the edge of each step is. Use tools that help you move around (mobility aids) if they are needed. These include: Canes. Walkers. Scooters. Crutches. Turn on the lights when you go into a dark area. Replace any light bulbs as soon as they burn out. Set up your furniture so you have a clear path. Avoid moving your furniture around. If any of your floors are uneven, fix them. If there are any pets around you, be aware of where they are. Review your medicines with your doctor. Some medicines can make you feel dizzy. This can increase your chance of falling. Ask your doctor what other things that you can do to help prevent falls. This information is not intended to replace advice given to you by your health care provider. Make sure you discuss any questions you have with your health care provider. Document Released: 05/14/2009 Document Revised: 12/24/2015 Document Reviewed: 08/22/2014 Elsevier Interactive Patient Education  2017 [employer].

## 2022-05-04 NOTE — Progress Notes (Signed)
Subjective:   Marissa King is a 76 y.o. female who presents for Medicare Annual (Subsequent) preventive examination.  Review of Systems    No ROS.  Medicare Wellness Virtual Visit.  Visual/audio telehealth visit, UTA vital signs.   See social history for additional risk factors.   Cardiac Risk Factors include: advanced age (>33mn, >>81women);diabetes mellitus;hypertension     Objective:    Today's Vitals   05/04/22 1123  Weight: 183 lb (83 kg)  Height: '5\' 4"'$  (1.626 m)   Body mass index is 31.41 kg/m.     05/04/2022   11:56 AM 05/03/2021   11:25 AM 04/30/2020   11:15 AM 04/30/2019   11:18 AM  Advanced Directives  Does Patient Have a Medical Advance Directive? No No Yes No  Type of AScientist, physiologicalof AArchboldLiving will   Does patient want to make changes to medical advance directive?   No - Patient declined Yes (MAU/Ambulatory/Procedural Areas - Information given)  Copy of HChattoogain Chart?   No - copy requested   Would patient like information on creating a medical advance directive? No - Patient declined No - Patient declined      Current Medications (verified) Outpatient Encounter Medications as of 05/04/2022  Medication Sig   albuterol (PROVENTIL HFA;VENTOLIN HFA) 108 (90 Base) MCG/ACT inhaler Inhale into the lungs.    amLODipine (NORVASC) 2.5 MG tablet Take 1 tablet (2.5 mg total) by mouth in the morning and at bedtime. Generic ok. D/c 5 mg dose. Take 1 2.5 daily if BP >130/>80 take another dose   aspirin EC 81 MG tablet Take 81 mg by mouth daily.   Azilsartan-Chlorthalidone 40-12.5 MG TABS Take 1 tablet by mouth every morning. For blood   budesonide-formoterol (SYMBICORT) 160-4.5 MCG/ACT inhaler Inhale 2 puffs into the lungs 2 (two) times daily. Rinse mouth for asthma   COLLAGEN PO Take by mouth.   Collagen-Vitamin C-Biotin (COLLAGEN 1500/C) 500-50-0.8 MG CAPS Take by mouth.   Evolocumab (REPATHA SURECLICK) 1509MG/ML SOAJ  INJECT 140 MG INTO THE SKIN EVERY 14 (FOURTEEN) DAYS.   glucose blood test strip Check sugars 1-2 times daily. 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   Insulin Pen Needle (PEN NEEDLES) 30G X 8 MM MISC 1 Device by Does not apply route every 14 (fourteen) days. For repatha or preferred pen needle dispense   ipratropium (ATROVENT) 0.06 % nasal spray Place 2 sprays into the nose 4 (four) times daily.   loratadine (CLARITIN) 10 MG tablet Take 1 tablet (10 mg total) by mouth daily as needed for allergies.   Multiple Vitamins-Minerals (MULTIVITAMIN WOMENS 50+ ADV PO) Take by mouth.   ONETOUCH DELICA LANCETS FINE MISC USE TO CHECK BLOOD SUGAR 1-2 TIMES DAILY   Polyethyl Glycol-Propyl Glycol (SYSTANE FREE OP) Apply 1 drop to eye daily.   sitaGLIPtin (JANUVIA) 25 MG tablet Take 1 tablet (25 mg total) by mouth daily.   VITAMIN D PO Take 5,000 Units by mouth daily.    No facility-administered encounter medications on file as of 05/04/2022.    Allergies (verified) Alendronate, Alprazolam, Amlodipine, Atorvastatin, Benzalkonium chloride, Clindamycin, Clonazepam, Clonidine, Clopidogrel, Escitalopram oxalate, Gabapentin, Hydrocodone-acetaminophen, Iodinated contrast media, Iodine, Labetalol, Levofloxacin, Lisinopril-hydrochlorothiazide, Livalo [pitavastatin], Lorazepam, Losartan potassium, Losartan potassium-hctz, Meloxicam, Metformin, Metoclopramide, Mupirocin, Oxcarbazepine, Penicillins, Potassium citrate, Pravastatin, Rosuvastatin, Shellfish allergy, Simvastatin, Sitagliptin, Sodium citrate, Spironolactone, Sulfa antibiotics, Tape, Wound dressing adhesive,  and Zetia [ezetimibe]   History: Past Medical History:  Diagnosis Date   Allergy    Arthritis    DDD cervical spine chronic neck pain    Asthma    with h/o chronic bronchitis   Back pain    mid back pain h/ o trauma    Diabetes mellitus without complication ([place])     Dysphagia    dilated x 2   Frequent headaches    Gastric ulcer    GERD (gastroesophageal reflux disease)    History of chicken pox    Hyperlipidemia    Hypertension    Migraines    Thyroid disease    mng    Past Surgical History:  Procedure Laterality Date   ABDOMINAL HYSTERECTOMY     DUB 1 ovary intact    CATARACT EXTRACTION     10/2020 and 12/2020   CHOLECYSTECTOMY     09/02/19   ESOPHAGEAL DILATION     x2   Family History  Problem Relation Age of Onset   Alcohol abuse Mother    Heart disease Mother        CHF   Hypertension Mother    Hyperlipidemia Mother    Stroke Mother    Mental illness Mother    Alcohol abuse Father    Cancer Father        pancreatitic    Arthritis Sister    COPD Sister    Depression Sister    Drug abuse Sister    Hearing loss Sister    Heart disease Sister        CHF   Hyperlipidemia Sister    Hypertension Sister    Mental illness Sister    COPD Brother    Depression Brother    Heart disease Brother    Hyperlipidemia Brother    Stroke Brother    Asthma Son    Arthritis Brother    Depression Brother    Heart disease Brother    Hyperlipidemia Brother    Mental illness Brother    Mental illness Brother    Hyperlipidemia Brother    Drug abuse Brother    Diabetes Brother    Depression Brother    COPD Brother    Birth defects Brother    Arthritis Brother    Social History   Socioeconomic History   Marital status: Widowed    Spouse name: Not on file   Number of children: Not on file   Years of education: Not on file   Highest education level: Not on file  Occupational History   Not on file  Tobacco Use   Smoking status: Never   Smokeless tobacco: Never  Substance and Sexual Activity   Alcohol use: No   Drug use: No   Sexual activity: Not Currently  Other Topics Concern   Not on file  Social History Narrative   2 sons, 4 pregnancies    Brother is [name]    Never smoker exposed 2nd hand    No guns     Wears seat belt    Safe in relationship, widowed lives alone   2 year college ed    Social Determinants of Health   Financial Resource Strain: Low Risk  (05/04/2022)   Overall Financial Resource Strain (CARDIA)    Difficulty of Paying Living Expenses: Not hard at all  Food Insecurity: No Food Insecurity (05/04/2022)   Hunger Vital Sign    Worried About Running Out of Food in the Last Year:  Never true    Ran Out of Food in the Last Year: Never true  Transportation Needs: No Transportation Needs (05/04/2022)   PRAPARE - Hydrologist (Medical): No    Lack of Transportation (Non-Medical): No  Physical Activity: Insufficiently Active (05/04/2022)   Exercise Vital Sign    Days of Exercise per Week: 5 days    Minutes of Exercise per Session: 20 min  Stress: No Stress Concern Present (05/04/2022)   [place]    Feeling of Stress : Not at all  Social Connections: Unknown (05/04/2022)   Social Connection and Isolation Panel [NHANES]    Frequency of Communication with Friends and Family: More than three times a week    Frequency of Social Gatherings with Friends and Family: More than three times a week    Attends Religious Services: Not on Advertising copywriter or Organizations: Not on file    Attends Archivist Meetings: Not on file    Marital Status: Not on file    Tobacco Counseling Counseling given: Not Answered   Clinical Intake:  Pre-visit preparation completed: Yes        Diabetes: Yes (Followed by PCP)  How often do you need to have someone help you when you read instructions, pamphlets, or other written materials from your doctor or pharmacy?: 1 - Never   Interpreter Needed?: No      Activities of Daily Living    05/04/2022   11:28 AM  In your present state of health, do you have any difficulty performing the following activities:  Hearing? 0  Vision?  0  Difficulty concentrating or making decisions? 0  Walking or climbing stairs? 0  Comment Pacing self with balance  Dressing or bathing? 0  Doing errands, shopping? 0  Preparing Food and eating ? N  Using the Toilet? N  In the past six months, have you accidently leaked urine? Y  Comment Managed with daily liner  Do you have problems with loss of bowel control? N  Managing your Medications? N  Managing your Finances? N  Housekeeping or managing your Housekeeping? N    Patient Care Team: McLean-Scocuzza, Nino Glow, MD as PCP - General (Internal Medicine) Belva Crome, MD as PCP - Cardiology (Cardiology)  Indicate any recent Medical Services you may have received from other than [facility] providers in the past year (date may be approximate).     Assessment:   This is a routine wellness examination for [facility].  I connected with  Leafy Kindle on 05/04/22 by a audio enabled telemedicine application and verified that I am speaking with the correct person using two identifiers.  Patient Location: Home  Provider Location: Office/Clinic  I discussed the limitations of evaluation and management by telemedicine. The patient expressed understanding and agreed to proceed.   Hearing/Vision screen Hearing Screening - Comments:: Patient is able to hear conversational tones without difficulty. No issues reported. Vision Screening - Comments:: Followed by Dr. Katy Fitch Cataract extraction, bilateral  Wears readers only  Dietary issues and exercise activities discussed: Current Exercise Habits: Home exercise routine, Type of exercise: walking, Time (Minutes): 20, Frequency (Times/Week): 5, Weekly Exercise (Minutes/Week): 100, Intensity: Mild   Goals Addressed               This Visit's Progress     Patient Stated     Follow up with Primary Care Provider (pt-stated)  Keep all routine scheduled appointments.       Depression Screen    05/04/2022   11:27 AM 03/04/2022    1:26 PM  05/03/2021   11:24 AM 06/10/2020    1:45 PM 04/30/2020   11:10 AM 10/17/2019   10:01 AM 05/22/2019    9:44 AM  PHQ 2/9 Scores  PHQ - 2 Score 0 0 0 0 0 0 0    Fall Risk    05/04/2022   11:26 AM 03/04/2022    1:26 PM 05/03/2021   11:26 AM 02/25/2021   11:22 AM 07/09/2020   11:13 AM  [place] in the past year? 0 0 0 0 0  Number falls in past yr: 0   0 0  Injury with Fall? 0   0 0  Risk for fall due to : No Fall Risks      Follow up Falls evaluation completed  Falls evaluation completed Falls evaluation completed Falls evaluation completed    [place]: Home free of loose throw rugs in walkways, pet beds, electrical cords, etc? Yes  Adequate lighting in your home to reduce risk of falls? Yes   ASSISTIVE DEVICES UTILIZED TO PREVENT FALLS: Life alert? No  Use of a cane, walker or w/c? No  Grab bars in the bathroom? Yes  Shower chair or bench in shower? No  Elevated toilet seat or a handicapped toilet? No   TIMED UP AND GO: Was the test performed? No .   Cognitive Function:        05/04/2022   11:56 AM 04/30/2019   11:23 AM  6CIT Screen  What Year? 0 points 0 points  What month? 0 points 0 points  What time? 0 points 0 points  Count back from 20 0 points 0 points  Months in reverse 0 points 0 points  Repeat phrase 0 points 0 points  Total Score 0 points 0 points    Immunizations Immunization History  Administered Date(s) Administered   PFIZER(Purple Top)SARS-COV-2 Vaccination 02/19/2020, 03/13/2020   Pneumococcal Conjugate-13 10/17/2016   Pneumococcal Polysaccharide-23 02/08/2018   Screening Tests Health Maintenance  Topic Date Due   Diabetic kidney evaluation - Urine ACR  08/01/2022 (Originally 06/09/2021)   FOOT EXAM  08/01/2022 (Originally 02/25/2022)   TETANUS/TDAP  08/31/2022 (Originally 01/07/1965)   HEMOGLOBIN A1C  09/04/2022   Diabetic kidney evaluation - GFR measurement  03/05/2023   Pneumonia Vaccine 30+ Years old   Completed   DEXA SCAN  Completed   Hepatitis C Screening  Completed   HPV VACCINES  Aged Out   INFLUENZA VACCINE  Discontinued   OPHTHALMOLOGY EXAM  Discontinued   COLONOSCOPY (Pts 45-51yr Insurance coverage will need to be confirmed)  Discontinued   COVID-19 Vaccine  Discontinued   Zoster Vaccines- Shingrix  Discontinued   Health Maintenance There are no preventive care reminders to display for this patient.  Lung Cancer Screening: (Low Dose CT Chest recommended if Age 420-80years, 30 pack-year currently smoking OR have quit w/in 15years.) does not qualify.   Hepatitis C Screening: Completed 2018.  Vision Screening: Recommended annual ophthalmology exams for early detection of glaucoma and other disorders of the eye.  Dental Screening: Recommended annual dental exams for proper oral hygiene  Community Resource Referral / Chronic Care Management: CRR required this visit?  No   CCM required this visit?  No      Plan:     I have personally reviewed and  noted the following in the patient's chart:   Medical and social history Use of alcohol, tobacco or illicit drugs  Current medications and supplements including opioid prescriptions. Patient is not currently taking opioid prescriptions. Functional ability and status Nutritional status Physical activity Advanced directives List of other physicians Hospitalizations, surgeries, and ER visits in previous 12 months Vitals Screenings to include cognitive, depression, and falls Referrals and appointments  In addition, I have reviewed and discussed with patient certain preventive protocols, quality metrics, and best practice recommendations. A written personalized care plan for preventive services as well as general preventive health recommendations were provided to patient.     Varney Biles, LPN   01/02/159

## 2022-05-12 ENCOUNTER — Ambulatory Visit: Payer: Medicare Other

## 2022-05-12 ENCOUNTER — Ambulatory Visit
Admission: RE | Admit: 2022-05-12 | Discharge: 2022-05-12 | Disposition: A | Discharge status: Home / Self Care | Payer: Medicare Other | Source: Ambulatory Visit | Attending: Internal Medicine | Admitting: Internal Medicine

## 2022-05-12 DIAGNOSIS — Z1231 Encounter for screening mammogram for malignant neoplasm of breast: Secondary | ICD-10-CM | POA: Diagnosis not present

## 2022-07-07 DIAGNOSIS — H35031 Hypertensive retinopathy, right eye: Secondary | ICD-10-CM | POA: Diagnosis not present

## 2022-09-11 ENCOUNTER — Encounter: Payer: Self-pay | Admitting: Internal Medicine

## 2022-09-11 NOTE — Progress Notes (Unsigned)
Subjective:    Patient ID: Marissa King, female    DOB: 1945-11-17, 77 y.o.   MRN: [id number]     HPI Marissa King is here to establish with a new pcp.  She is here for follow up of her chronic medical problems, including DM, htn, COPD, asthma, GERD/Barrett's, h/o CVA  Headaches - has had more headaches - ? Weather related.  Typically takes tylenol - only 1 and it worse.    Tries to walk when she can.    Medications and allergies reviewed with patient and updated if appropriate.  Current Outpatient Medications on File Prior to Visit  Medication Sig Dispense Refill   albuterol (PROVENTIL HFA;VENTOLIN HFA) 108 (90 Base) MCG/ACT inhaler Inhale into the lungs.      amLODipine (NORVASC) 2.5 MG tablet Take 1 tablet (2.5 mg total) by mouth in the morning and at bedtime. Generic ok. D/c 5 mg dose. Take 1 2.5 daily if BP >130/>80 take another dose 180 tablet 3   aspirin EC 81 MG tablet Take 81 mg by mouth daily.     Azilsartan-Chlorthalidone 40-12.5 MG TABS Take 1 tablet by mouth every morning. For blood 90 tablet 3   budesonide-formoterol (SYMBICORT) 160-4.5 MCG/ACT inhaler Inhale 2 puffs into the lungs 2 (two) times daily. Rinse mouth for asthma 1 each 11   Collagen-Vitamin C-Biotin (COLLAGEN 1500/C) 500-50-0.8 MG CAPS Take by mouth.     Evolocumab (REPATHA SURECLICK) [id number] MG/ML SOAJ INJECT 140 MG INTO THE SKIN EVERY 14 (FOURTEEN) DAYS. 2 mL 11   glucose blood test strip Check sugars 1-2 times daily. E11.9 Non-insulin dependent 200 each 12   hydrALAZINE (APRESOLINE) 50 MG tablet Take 1 tablet (50 mg total) by mouth 3 (three) times daily. 270 tablet 3   Insulin Pen Needle (PEN NEEDLES) 30G X 8 MM MISC 1 Device by Does not apply route every 14 (fourteen) days. For repatha or preferred pen needle dispense 10 each 11   ipratropium (ATROVENT) 0.06 % nasal spray Place 2 sprays into the nose 4 (four) times daily. 45 mL 4   loratadine (CLARITIN) 10 MG tablet Take 1 tablet (10 mg total) by mouth daily  as needed for allergies. 90 tablet 3   Multiple Vitamins-Minerals (MULTIVITAMIN WOMENS 50+ ADV PO) Take by mouth.     ONETOUCH DELICA LANCETS FINE MISC USE TO CHECK BLOOD SUGAR 1-2 TIMES DAILY     Polyethyl Glycol-Propyl Glycol (SYSTANE FREE OP) Apply 1 drop to eye daily.     sitaGLIPtin (JANUVIA) 25 MG tablet Take 1 tablet (25 mg total) by mouth daily. 90 tablet 3   VITAMIN D PO Take 5,000 Units by mouth daily.      No current facility-administered medications on file prior to visit.     Review of Systems  Constitutional:  Negative for fever.  Respiratory:  Positive for wheezing (occ). Negative for cough and shortness of breath.   Cardiovascular:  Positive for palpitations (once in while) and leg swelling (occ - mild). Negative for chest pain.  Neurological:  Positive for dizziness (occ) and headaches (occ).       Objective:   Vitals:   09/12/22 1115  BP: 132/70  Pulse: 84  Temp: 98 F (36.7 C)  SpO2: 98%   BP Readings from Last 3 Encounters:  09/12/22 132/70  03/04/22 132/84  08/31/21 140/84   Wt Readings from Last 3 Encounters:  09/12/22 186 lb (84.4 kg)  05/04/22 183 lb (83 kg)  03/04/22 183  lb (83 kg)   Body mass index is 31.93 kg/m.    Physical Exam Constitutional:      General: She is not in acute distress.    Appearance: Normal appearance.  HENT:     Head: Normocephalic and atraumatic.  Eyes:     Conjunctiva/sclera: Conjunctivae normal.  Neck:     Comments: thyromegaly Cardiovascular:     Rate and Rhythm: Normal rate and regular rhythm.     Heart sounds: Normal heart sounds. No murmur heard. Pulmonary:     Effort: Pulmonary effort is normal. No respiratory distress.     Breath sounds: Normal breath sounds. No wheezing.  Musculoskeletal:     Cervical back: Neck supple.     Right lower leg: No edema.     Left lower leg: No edema.  Lymphadenopathy:     Cervical: No cervical adenopathy.  Skin:    General: Skin is warm and dry.     Findings: No  rash.  Neurological:     Mental Status: She is alert. Mental status is at baseline.  Psychiatric:        Mood and Affect: Mood normal.        Behavior: Behavior normal.        Lab Results  Component Value Date   WBC 4.4 03/04/2022   HGB 14.1 03/04/2022   HCT 41.9 03/04/2022   PLT 238.0 03/04/2022   GLUCOSE 95 03/04/2022   CHOL 168 03/04/2022   TRIG 65.0 03/04/2022   HDL 63.30 03/04/2022   LDLCALC 91 03/04/2022   ALT 13 03/04/2022   AST 20 03/04/2022   NA 131 (L) 03/04/2022   K 4.0 03/04/2022   CL 95 (L) 03/04/2022   CREATININE 0.93 03/04/2022   BUN 9 03/04/2022   CO2 28 03/04/2022   TSH 1.27 06/11/2020   HGBA1C 6.4 03/04/2022   MICROALBUR 0.8 06/09/2020     Assessment & Plan:    See Problem List for Assessment and Plan of chronic medical problems.

## 2022-09-11 NOTE — Patient Instructions (Addendum)
      Blood work was ordered.   The lab is on the first floor.    Medications changes include :   none     Return in about 6 months (around 03/13/2023) for Physical Exam.

## 2022-09-12 ENCOUNTER — Ambulatory Visit (INDEPENDENT_AMBULATORY_CARE_PROVIDER_SITE_OTHER): Payer: 59 | Admitting: Internal Medicine

## 2022-09-12 VITALS — BP 132/70 | HR 84 | Temp 98.0°F | Ht 64.0 in | Wt 186.0 lb

## 2022-09-12 DIAGNOSIS — T466X5A Adverse effect of antihyperlipidemic and antiarteriosclerotic drugs, initial encounter: Secondary | ICD-10-CM

## 2022-09-12 DIAGNOSIS — E042 Nontoxic multinodular goiter: Secondary | ICD-10-CM

## 2022-09-12 DIAGNOSIS — H3411 Central retinal artery occlusion, right eye: Secondary | ICD-10-CM

## 2022-09-12 DIAGNOSIS — I10 Essential (primary) hypertension: Secondary | ICD-10-CM | POA: Diagnosis not present

## 2022-09-12 DIAGNOSIS — E7849 Other hyperlipidemia: Secondary | ICD-10-CM

## 2022-09-12 DIAGNOSIS — E119 Type 2 diabetes mellitus without complications: Secondary | ICD-10-CM | POA: Diagnosis not present

## 2022-09-12 DIAGNOSIS — J449 Chronic obstructive pulmonary disease, unspecified: Secondary | ICD-10-CM

## 2022-09-12 DIAGNOSIS — J4520 Mild intermittent asthma, uncomplicated: Secondary | ICD-10-CM | POA: Diagnosis not present

## 2022-09-12 DIAGNOSIS — K219 Gastro-esophageal reflux disease without esophagitis: Secondary | ICD-10-CM

## 2022-09-12 DIAGNOSIS — G720 Drug-induced myopathy: Secondary | ICD-10-CM | POA: Diagnosis not present

## 2022-09-12 LAB — COMPREHENSIVE METABOLIC PANEL
ALT: 20 U/L (ref 0–35)
AST: 26 U/L (ref 0–37)
Albumin: 4.3 g/dL (ref 3.5–5.2)
Alkaline Phosphatase: 82 U/L (ref 39–117)
BUN: 12 mg/dL (ref 6–23)
CO2: 27 mEq/L (ref 19–32)
Calcium: 10.1 mg/dL (ref 8.4–10.5)
Chloride: 98 mEq/L (ref 96–112)
Creatinine, Ser: 0.93 mg/dL (ref 0.40–1.20)
GFR: 60.05 mL/min (ref >60.00)
Glucose, Bld: 97 mg/dL (ref 70–99)
Potassium: 4 mEq/L (ref 3.5–5.1)
Sodium: 134 mEq/L — ABNORMAL LOW (ref 135–145)
Total Bilirubin: 0.6 mg/dL (ref 0.2–1.2)
Total Protein: 7.9 g/dL (ref 6.0–8.3)

## 2022-09-12 LAB — CBC WITH DIFFERENTIAL/PLATELET
Basophils Absolute: 0 10*3/uL (ref 0.0–0.1)
Basophils Relative: 0.4 % (ref 0.0–3.0)
Eosinophils Absolute: 0.1 10*3/uL (ref 0.0–0.7)
Eosinophils Relative: 1.4 % (ref 0.0–5.0)
HCT: 44 % (ref 36.0–46.0)
Hemoglobin: 14.5 g/dL (ref 12.0–15.0)
Lymphocytes Relative: 22.6 % (ref 12.0–46.0)
Lymphs Abs: 1.3 10*3/uL (ref 0.7–4.0)
MCHC: 33 g/dL (ref 30.0–36.0)
MCV: 88.3 fl (ref 78.0–100.0)
Monocytes Absolute: 0.6 10*3/uL (ref 0.1–1.0)
Monocytes Relative: 10 % (ref 3.0–12.0)
Neutro Abs: 3.7 10*3/uL (ref 1.4–7.7)
Neutrophils Relative %: 65.6 % (ref 43.0–77.0)
Platelets: 274 10*3/uL (ref 150.0–400.0)
RBC: 4.98 Mil/uL (ref 3.87–5.11)
RDW: 13.5 % (ref 11.5–15.5)
WBC: 5.7 10*3/uL (ref 4.0–10.5)

## 2022-09-12 LAB — HEMOGLOBIN A1C: Hgb A1c MFr Bld: 6.4 % (ref 4.6–6.5)

## 2022-09-12 LAB — LIPID PANEL
Cholesterol: 171 mg/dL (ref 0–200)
HDL: 68.6 mg/dL (ref >39.00)
LDL Cholesterol: 85 mg/dL (ref 0–99)
NonHDL: 102.26
Total CHOL/HDL Ratio: 2
Triglycerides: 88 mg/dL (ref 0.0–149.0)
VLDL: 17.6 mg/dL (ref 0.0–40.0)

## 2022-09-12 NOTE — Assessment & Plan Note (Signed)
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

## 2022-09-12 NOTE — Assessment & Plan Note (Signed)
Chronic Mild, intermittent Uses symbicort as needed only, uses albuterol rarely

## 2022-09-12 NOTE — Assessment & Plan Note (Signed)
Chronic   Lab Results  Component Value Date   HGBA1C 6.4 03/04/2022   Sugars well controlled Check A1c, urine microalbumin today Continue Januvia 25 mg daily Stressed regular exercise, diabetic diet

## 2022-09-12 NOTE — Assessment & Plan Note (Signed)
Chronic Blood pressure well controlled CMP Continue amlodipine 2.5 mg twice daily, azilsartan-chlorthalidone 40-12.5 mg daily, hydralazine 50 mg 3 times daily

## 2022-09-12 NOTE — Assessment & Plan Note (Signed)
Chronic - related to exposure from parents smoking Mild, intermittent Uses symbicort as needed only, uses albuterol rarely

## 2022-09-12 NOTE — Assessment & Plan Note (Signed)
Chronic Hurts Affects voice Was going to have it removed, but did not want to come off the ASA

## 2022-09-12 NOTE — Assessment & Plan Note (Signed)
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

## 2022-09-12 NOTE — Assessment & Plan Note (Signed)
Chronic History of statin myopathy-intolerant Doing okay on Repatha 140 mg q. 14 days

## 2022-09-13 LAB — MICROALBUMIN / CREATININE URINE RATIO
Creatinine,U: 48.7 mg/dL
Microalb Creat Ratio: 1.4 mg/g (ref 0.0–30.0)
Microalb, Ur: <0.7 mg/dL (ref 0.0–1.9)

## 2022-09-27 ENCOUNTER — Ambulatory Visit (HOSPITAL_COMMUNITY)
Admission: EM | Admit: 2022-09-27 | Discharge: 2022-09-27 | Disposition: A | Discharge status: Home / Self Care | Payer: 59 | Attending: Internal Medicine | Admitting: Internal Medicine

## 2022-09-27 ENCOUNTER — Encounter (HOSPITAL_COMMUNITY): Payer: Self-pay

## 2022-09-27 DIAGNOSIS — J309 Allergic rhinitis, unspecified: Secondary | ICD-10-CM

## 2022-09-27 DIAGNOSIS — J209 Acute bronchitis, unspecified: Secondary | ICD-10-CM | POA: Diagnosis not present

## 2022-09-27 DIAGNOSIS — J3089 Other allergic rhinitis: Secondary | ICD-10-CM | POA: Diagnosis not present

## 2022-09-27 DIAGNOSIS — J4520 Mild intermittent asthma, uncomplicated: Secondary | ICD-10-CM

## 2022-09-27 MED ORDER — CLARITIN 10 MG PO TABS: 10.0000 mg | ORAL_TABLET | Freq: Every day | ORAL | 0 refills | Status: AC

## 2022-09-27 MED ORDER — AZITHROMYCIN 250 MG PO TABS: ORAL_TABLET | ORAL | 0 refills | Status: DC

## 2022-09-27 NOTE — ED Triage Notes (Signed)
Pt c/o cough, nasal/chest congestion, wheezing, and ear pain for 4-5 day but has became worse in past 2 days. States used her inhaler at 7am and it helped for 61mns. Denies taking any OTC meds.

## 2022-09-27 NOTE — ED Provider Notes (Signed)
[place]    CSN: [id number] Arrival date & time: 09/27/22  1141      History   Chief Complaint Chief Complaint  Patient presents with   Cough    HPI Marissa King is a 77 y.o. 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 In the past 2 days her cough has been worse, with productive brown to green mucous which has also been draining from her nose. Her ears have been hurting for 4-5 days. She used her inhaler at 7 am which helped for 20 minutes only. Has chest soreness on the center which is tender to touch. She knows her allergies have been flaring up and she is intolerant to generic allergy meds. Is able to tolerate Brand name Claritin fine, but is out. Has been using her COPD inhalers as usual.     Past Medical History:  Diagnosis Date   Allergy    Arthritis    DDD cervical spine chronic neck pain    Asthma    with h/o chronic bronchitis   Back pain    mid back pain h/ o trauma    Diabetes mellitus without complication ([place])    Dysphagia    dilated x 2   Frequent headaches    Gastric ulcer    GERD (gastroesophageal reflux disease)    History of chicken pox    Hyperlipidemia    Hypertension    Migraines    Thyroid disease    mng     Patient Active Problem List   Diagnosis Date Noted   Gastric polyp 08/31/2021   Hiatal hernia 08/31/2021   Diverticulosis 08/31/2021   Internal hemorrhoids 08/31/2021   Polyp of colon 08/31/2021   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   Cataract of both eyes 10/08/2020   Intracranial atherosclerosis 07/09/2020   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[place]) 06/23/2020   Obesity, diabetes, and hypertension syndrome ([place]) 06/16/2020   Age-related cataract of both eyes 06/10/2020   Hypertension associated with diabetes ([place]) 02/06/2020   Vision loss, right eye 02/06/2020   Myopathy, unspecified 02/06/2020   Chronic midline low back pain without sciatica 10/17/2019   S/P  laparoscopic cholecystectomy 09/24/2019   Insomnia 09/05/2019   Atrial tachycardia 09/05/2019   Anxiety 09/05/2019   CRAO (central retinal artery occlusion), 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   Combined forms of age-related cataract of both eyes 07/12/2019   Carotid artery stenosis 06/23/2019   MRI of brain abnormal 06/23/2019   Leg edema 02/13/2019   Cervicalgia 10/04/2018   Mid back pain 10/04/2018   Arthritis 06/07/2018   Type 2 diabetes mellitus without complication, without long-term current use of insulin ([place]) 06/07/2018   Multinodular goiter 06/07/2018   Essential hypertension 06/07/2018   Hyperlipidemia 06/07/2018   Chest pain 06/07/2018   Mild intermittent asthma 06/07/2018   Osteopenia of multiple sites 10/05/2016   Cervical radiculopathy 09/10/2016   Barrett's esophagus with dysplasia 04/23/2016   Chronic constipation 09/28/2015   COPD (chronic obstructive pulmonary disease) ([place]) 09/28/2015   GERD (gastroesophageal reflux disease) 09/28/2015   History of stroke 09/28/2015   Migraine 09/28/2015   Obesity 09/28/2015   Small vessel disease ([place]) 09/28/2015   Vasomotor rhinitis 09/28/2015    Past Surgical History:  Procedure Laterality Date   ABDOMINAL HYSTERECTOMY     DUB 1 ovary intact    CATARACT EXTRACTION     10/2020 and 12/2020   CHOLECYSTECTOMY     09/02/19  ESOPHAGEAL DILATION     x2    OB History   No obstetric history on file.      Home Medications    Prior to Admission medications   Medication Sig Start Date End Date Taking? Authorizing Provider  azithromycin (ZITHROMAX Z-PAK) 250 MG tablet Take 2 today, and then one qd x 4 days 09/27/22  Yes Rodriguez-Southworth, Sunday Spillers, PA-C  CLARITIN 10 MG tablet Take 1 tablet (10 mg total) by mouth daily. 09/27/22  Yes Rodriguez-Southworth, Sunday Spillers, PA-C  albuterol (PROVENTIL HFA;VENTOLIN HFA) 108 (90 Base) MCG/ACT inhaler Inhale into the lungs.     [provider]  amLODipine (NORVASC) 2.5 MG tablet Take 1 tablet  (2.5 mg total) by mouth in the morning and at bedtime. Generic ok. D/c 5 mg dose. Take 1 2.5 daily if BP >130/>80 take another dose 03/04/22   McLean-Scocuzza, Nino Glow, MD  aspirin EC 81 MG tablet Take 81 mg by mouth daily.    [provider]  Azilsartan-Chlorthalidone 40-12.5 MG TABS Take 1 tablet by mouth every morning. For blood 03/04/22   McLean-Scocuzza, Nino Glow, MD  budesonide-formoterol [facility]) 160-4.5 MCG/ACT inhaler Inhale 2 puffs into the lungs 2 (two) times daily. Rinse mouth for asthma 08/31/21   McLean-Scocuzza, Nino Glow, MD  Collagen-Vitamin C-Biotin (COLLAGEN 1500/C) 500-50-0.8 MG CAPS Take by mouth.    [provider]  Evolocumab (REPATHA SURECLICK) [id number] MG/ML SOAJ INJECT 140 MG INTO THE SKIN EVERY 14 (FOURTEEN) DAYS. 03/04/22   McLean-Scocuzza, Nino Glow, MD  glucose blood test strip Check sugars 1-2 times daily. E11.9 Non-insulin dependent 08/31/21   McLean-Scocuzza, Nino Glow, MD  hydrALAZINE (APRESOLINE) 50 MG tablet Take 1 tablet (50 mg total) by mouth 3 (three) times daily. 03/04/22   McLean-Scocuzza, Nino Glow, MD  Insulin Pen Needle (PEN NEEDLES) 30G X 8 MM MISC 1 Device by Does not apply route every 14 (fourteen) days. For repatha or preferred pen needle dispense 02/28/20   McLean-Scocuzza, Nino Glow, MD  ipratropium (ATROVENT) 0.06 % nasal spray Place 2 sprays into the nose 4 (four) times daily. 03/04/22   McLean-Scocuzza, Nino Glow, MD  Multiple Vitamins-Minerals (MULTIVITAMIN WOMENS 50+ ADV PO) Take by mouth.    [provider]  [place] USE TO CHECK BLOOD SUGAR 1-2 TIMES DAILY 07/19/17   [provider]  Polyethyl Glycol-Propyl Glycol (SYSTANE FREE OP) Apply 1 drop to eye daily.    [provider]  sitaGLIPtin (JANUVIA) 25 MG tablet Take 1 tablet (25 mg total) by mouth daily. 03/04/22   McLean-Scocuzza, Nino Glow, MD  VITAMIN D PO Take 5,000 Units by mouth daily.     [provider]    Family History Family History   Problem Relation Age of Onset   Alcohol abuse Mother    Heart disease Mother        CHF   Hypertension Mother    Hyperlipidemia Mother    Stroke Mother    Mental illness Mother    Alcohol abuse Father    Cancer Father        pancreatitic    Arthritis Sister    COPD Sister    Depression Sister    Drug abuse Sister    Hearing loss Sister    Heart disease Sister        CHF   Hyperlipidemia Sister    Hypertension Sister    Mental illness Sister    COPD Brother    Depression Brother  Heart disease Brother    Hyperlipidemia Brother    Stroke Brother    Asthma Son    Arthritis Brother    Depression Brother    Heart disease Brother    Hyperlipidemia Brother    Mental illness Brother    Mental illness Brother    Hyperlipidemia Brother    Drug abuse Brother    Diabetes Brother    Depression Brother    COPD Brother    Birth defects Brother    Arthritis Brother     Social History Social History   Tobacco Use   Smoking status: Never   Smokeless tobacco: Never  Substance Use Topics   Alcohol use: No   Drug use: No     Allergies   Alendronate, Alprazolam, Amlodipine, Atorvastatin, Benzalkonium chloride, Clindamycin, Clonazepam, Clonidine, Clopidogrel, Escitalopram oxalate, Gabapentin, Hydrocodone-acetaminophen, Iodinated contrast media, Iodine, Labetalol, Levofloxacin, Lisinopril-hydrochlorothiazide, Livalo [pitavastatin], Lorazepam, Losartan potassium, Losartan potassium-hctz, Meloxicam, Metformin, Metoclopramide, Mupirocin, Oxcarbazepine, Penicillins, Potassium citrate, Pravastatin, Rosuvastatin, Shellfish allergy, Simvastatin, Sitagliptin, Sodium citrate, Spironolactone, Sulfa antibiotics, Tape, Wound dressing adhesive, and Zetia [ezetimibe]   Review of Systems Review of Systems  Constitutional:  Positive for chills. Negative for appetite change, diaphoresis and fever.  HENT:  Positive for congestion, ear pain, postnasal drip and sinus pressure. Negative for ear  discharge.   Respiratory:  Positive for cough and wheezing. Negative for shortness of breath.   Musculoskeletal:  Negative for myalgias.  Neurological:  Negative for headaches.  Hematological:  Negative for adenopathy.     Physical Exam Triage Vital Signs ED Triage Vitals  Enc Vitals Group     BP 09/27/22 1256 (!) 163/85     Pulse Rate 09/27/22 1256 76     Resp 09/27/22 1256 18     Temp 09/27/22 1256 98.3 F (36.8 C)     Temp Source 09/27/22 1256 Oral     SpO2 09/27/22 1256 95 %     Weight --      Height --      Head Circumference --      Peak Flow --      Pain Score 09/27/22 1257 4     Pain Loc --      Pain Edu? --      Excl. in [place]? --    No data found.  Updated Vital Signs BP (!) 163/85 (BP Location: Right Arm)   Pulse 76   Temp 98.3 F (36.8 C) (Oral)   Resp 18   SpO2 95%   Visual Acuity Right Eye Distance:   Left Eye Distance:   Bilateral Distance:    Right Eye Near:   Left Eye Near:    Bilateral Near:     Physical Exam Physical Exam Vitals signs and nursing note reviewed.  Constitutional:      General: She is not in acute distress.    Appearance: Normal appearance. She is not ill-appearing, toxic-appearing or diaphoretic.  HENT:     Head: Normocephalic.     Right Ear: Tympanic membrane, ear canal and external ear normal.     Left Ear: Tympanic membrane, ear canal and external ear normal.     Nose: with moderate mucosa congestion and light green mucous     Mouth/Throat: clear    Mouth: Mucous membranes are moist.  Eyes:     General: No scleral icterus.       Right eye: No discharge.        Left eye: No discharge.     Conjunctiva/sclera:  Conjunctivae normal.  Neck:     Musculoskeletal: Neck supple. No neck rigidity.  Cardiovascular:     Rate and Rhythm: Normal rate and regular rhythm.     Heart sounds: No murmur.  Pulmonary:     Effort: Pulmonary effort is normal.     Breath sounds: Normal breath sounds. Has local tenderness on mid substernal  region which reproduced the chest soreness she has been having Musculoskeletal: Normal range of motion.  Lymphadenopathy:     Cervical: No cervical adenopathy.  Skin:    General: Skin is warm and dry.     Coloration: Skin is not jaundiced.     Findings: No rash.  Neurological:     Mental Status: She is alert and oriented to person, place, and time.     Gait: Gait normal.  Psychiatric:        Mood and Affect: Mood normal.        Behavior: Behavior normal.        Thought Content: Thought content normal.        Judgment: Judgment normal.    UC Treatments / Results  Labs (all labs ordered are listed, but only abnormal results are displayed) Labs Reviewed - No data to display  EKG   Radiology No results found.  Procedures Procedures (including critical care time)  Medications Ordered in UC Medications - No data to display  Initial Impression / Assessment and Plan / UC Course  I have reviewed the triage vital signs and the nursing notes.  Allergic rhinitis COPD exacerbation  I placed her on Zpack and Brand name Claritin. She is to continue her inhalers.  Final Clinical Impressions(s) / UC Diagnoses   Final diagnoses:  Acute bronchitis, unspecified organism  Seasonal allergic rhinitis due to other allergic trigger   Discharge Instructions   None    ED Prescriptions     Medication Sig Dispense Auth. Provider   azithromycin (ZITHROMAX Z-PAK) 250 MG tablet Take 2 today, and then one qd x 4 days 6 tablet Rodriguez-Southworth, Margarita Croke, PA-C   CLARITIN 10 MG tablet Take 1 tablet (10 mg total) by mouth daily. 90 tablet Rodriguez-Southworth, Sunday Spillers, PA-C      PDMP not reviewed this encounter.   Shelby Mattocks, PA-C 09/27/22 1359

## 2022-11-09 IMAGING — MG MM DIGITAL SCREENING BILAT W/ TOMO AND CAD
8 series · 8 of 24 positions shown · non-contrast
Comparison: Previous exam(s).

CLINICAL DATA: Screening.

EXAM:
DIGITAL SCREENING BILATERAL MAMMOGRAM WITH TOMOSYNTHESIS AND CAD
TECHNIQUE: Bilateral screening digital craniocaudal and mediolateral oblique
mammograms were obtained. Bilateral screening digital breast
tomosynthesis was performed. The images were evaluated with
computer-aided detection.

[R CC synth-2D]
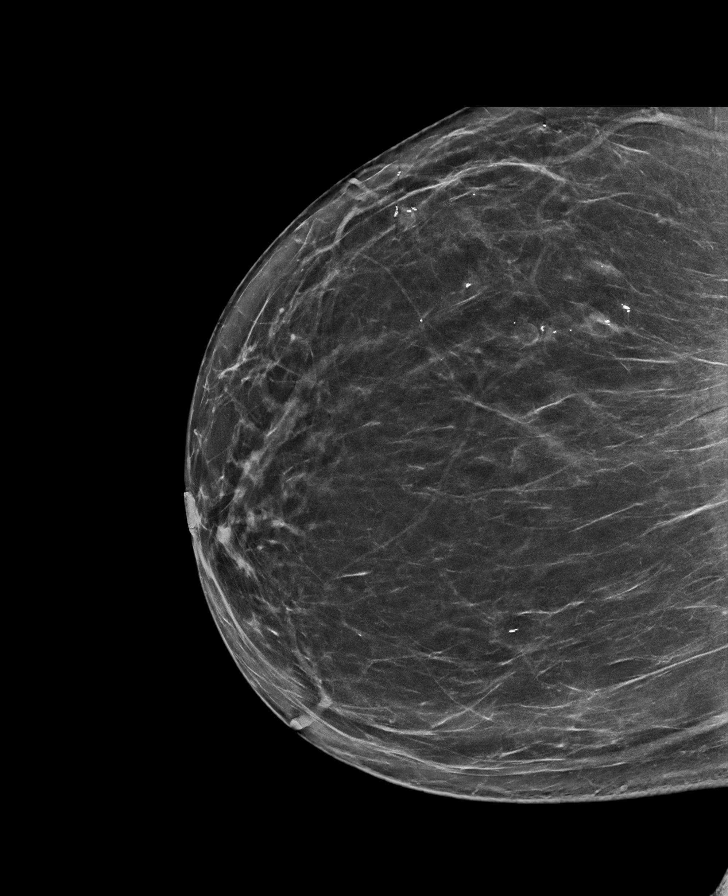

[L MLO synth-2D]
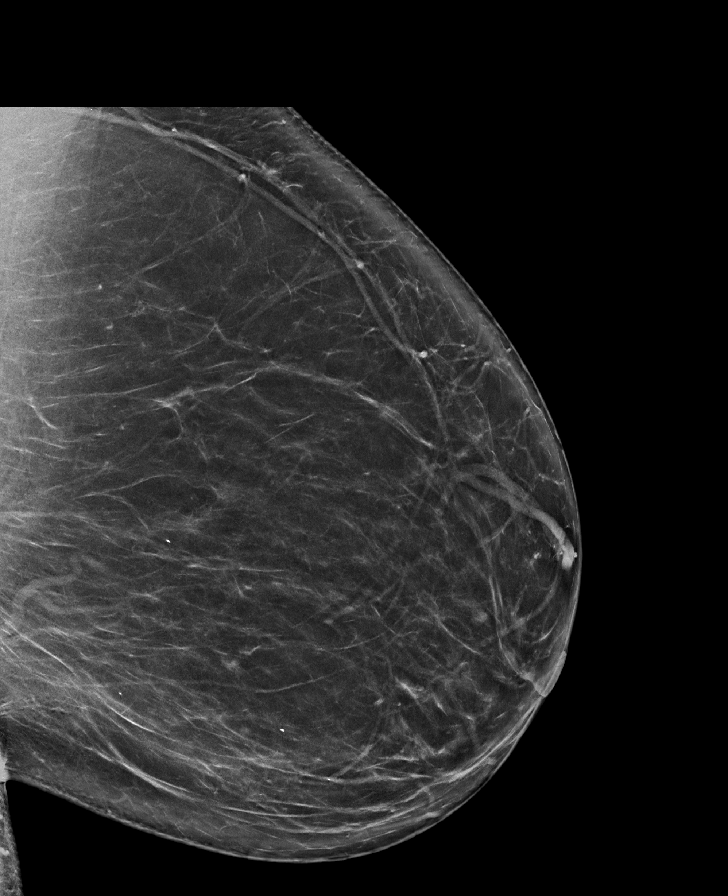

[L CC synth-2D]
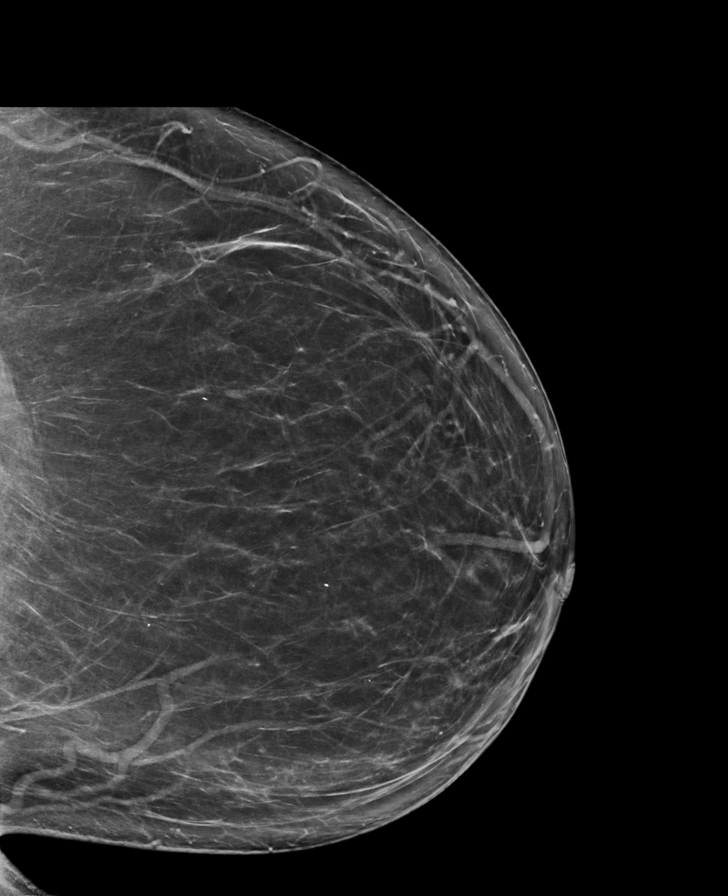

[R MLO synth-2D]
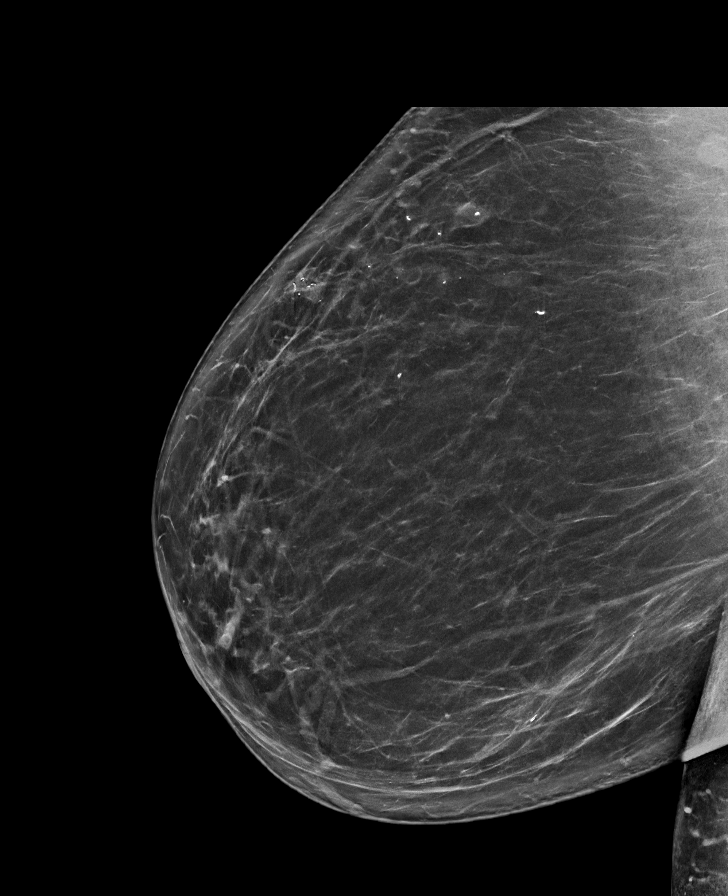

[L MLO tomo · tomo slice 46/91.0]
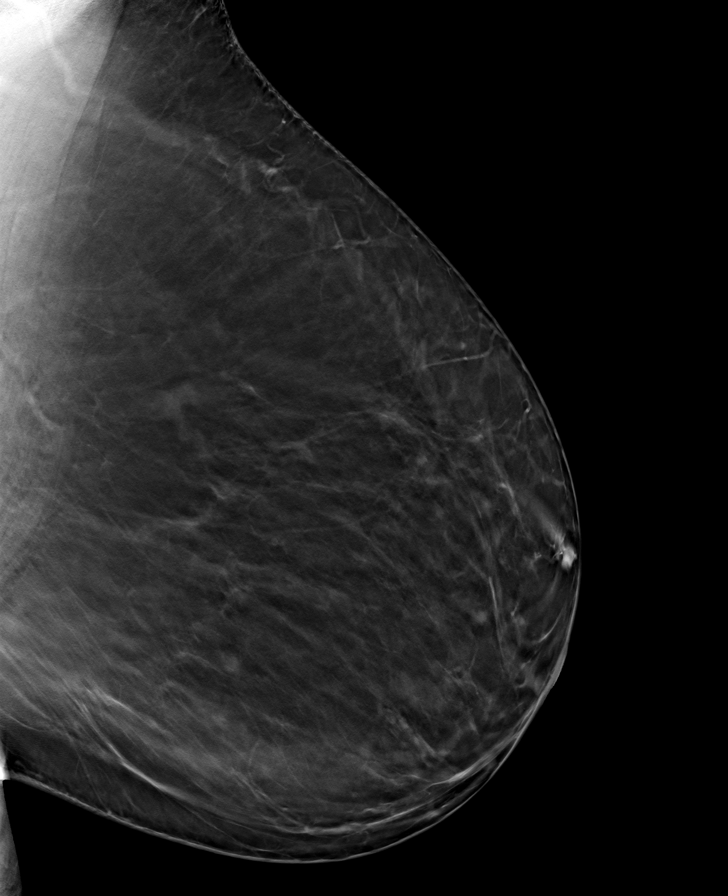

[R CC tomo · tomo slice 41/82.0]
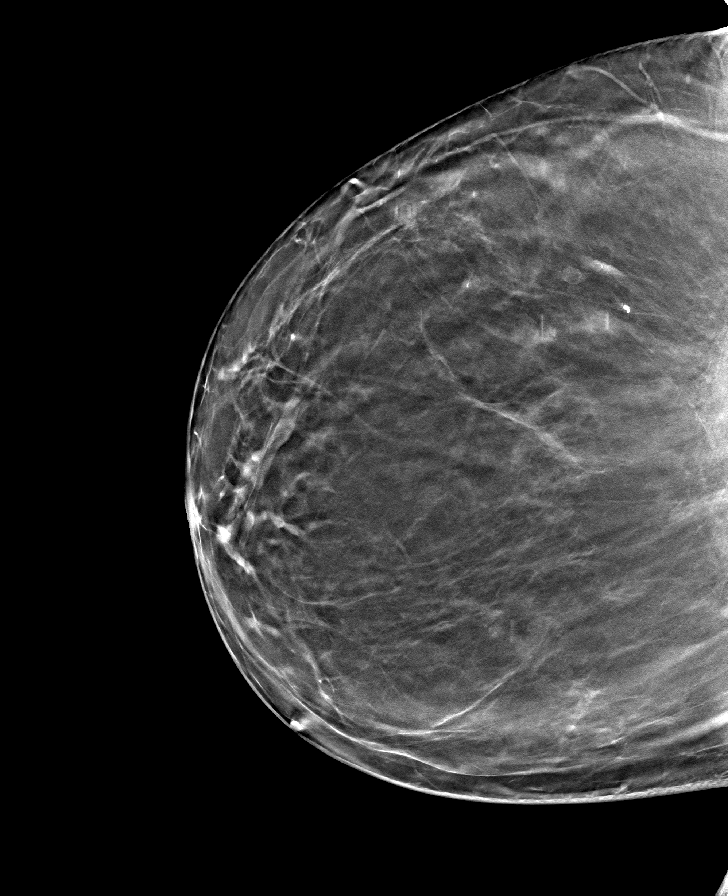

[R MLO tomo · tomo slice 46/91.0]
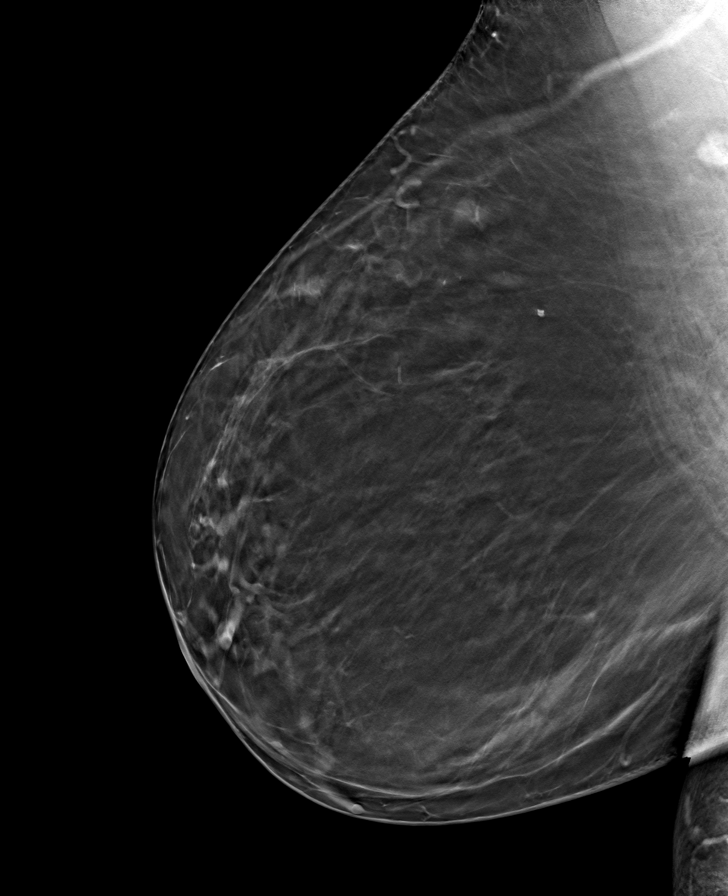

[L CC tomo · tomo slice 43/85.0]
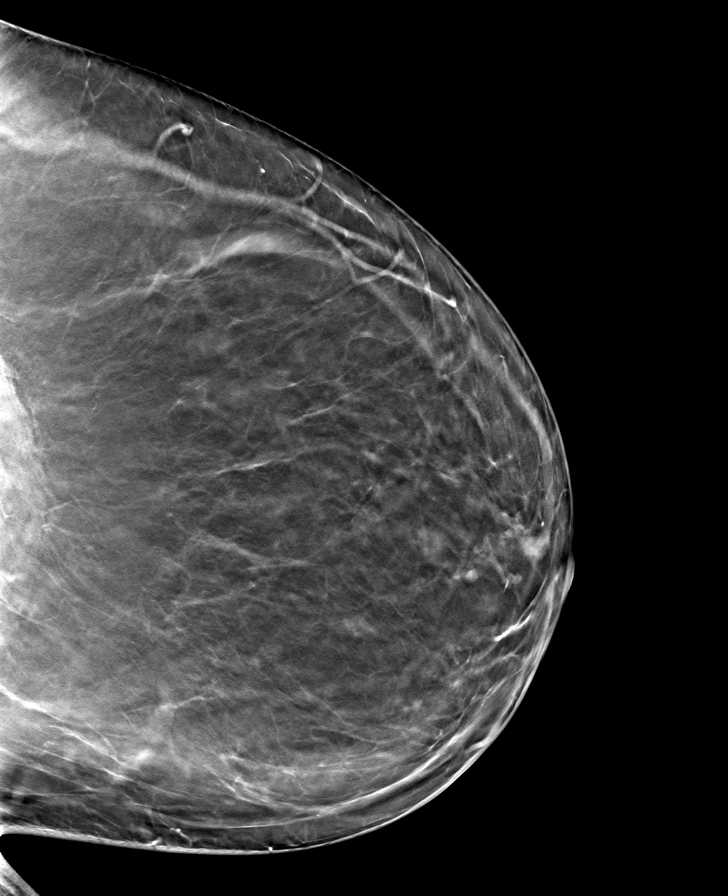

[8 of 24 positions shown; findings below may reference images not displayed]

ACR Breast Density Category b: There are scattered areas of
fibroglandular density.
FINDINGS: There are no findings suspicious for malignancy.
IMPRESSION: No mammographic evidence of malignancy. A result letter of this
screening mammogram will be mailed directly to the patient.

RECOMMENDATION:
Screening mammogram in one year. (Code:51-O-LD2)

BI-RADS CATEGORY  1: Negative.

## 2023-02-03 ENCOUNTER — Telehealth: Payer: Self-pay | Admitting: Internal Medicine

## 2023-02-03 DIAGNOSIS — I10 Essential (primary) hypertension: Secondary | ICD-10-CM

## 2023-02-03 MED ORDER — AMLODIPINE BESYLATE 2.5 MG PO TABS: 2.5000 mg | ORAL_TABLET | Freq: Two times a day (BID) | ORAL | 1 refills | Status: DC

## 2023-02-03 NOTE — Telephone Encounter (Signed)
Prescription Request  02/03/2023  LOV: 09/12/2022  What is the name of the medication or equipment?  amLODipine (NORVASC) 2.5 MG tablet  Have you contacted your pharmacy to request a refill? No   Which pharmacy would you like this sent to?   [facility] [place], [place] - [address] [facility] [place] [address] [place] [address] [place] [address] Phone: [phone] Fax: [phone]  Patient notified that their request is being sent to the clinical staff for review and that they should receive a response within 2 business days.   Please advise at Mobile [phone] (mobile)

## 2023-03-12 NOTE — Patient Instructions (Addendum)
      Blood work was ordered.   The lab is on the first floor.    Medications changes include :   None    A referral was ordered and someone will call you to schedule an appointment.     Return in about 6 months (around 09/13/2023) for Physical Exam.

## 2023-03-12 NOTE — Progress Notes (Signed)
      Subjective:    Patient ID: Marissa King, female    DOB: May 21, 1946, 77 y.o.   MRN: [id number]     HPI Marissa King is here for follow up of her chronic medical problems.    Medications and allergies reviewed with patient and updated if appropriate.  Current Outpatient Medications on File Prior to Visit  Medication Sig Dispense Refill  . albuterol (PROVENTIL HFA;VENTOLIN HFA) 108 (90 Base) MCG/ACT inhaler Inhale into the lungs.     Marland Kitchen aspirin EC 81 MG tablet Take 81 mg by mouth daily.    Marland Kitchen CLARITIN 10 MG tablet Take 1 tablet (10 mg total) by mouth daily. 90 tablet 0  . Collagen-Vitamin C-Biotin (COLLAGEN 1500/C) 500-50-0.8 MG CAPS Take by mouth.    Marland Kitchen glucose blood test strip Check sugars 1-2 times daily. E11.9 Non-insulin dependent 200 each 12  . Insulin Pen Needle (PEN NEEDLES) 30G X 8 MM MISC 1 Device by Does not apply route every 14 (fourteen) days. For repatha or preferred pen needle dispense 10 each 11  . ipratropium (ATROVENT) 0.06 % nasal spray Place 2 sprays into the nose 4 (four) times daily. 45 mL 4  . Multiple Vitamins-Minerals (MULTIVITAMIN WOMENS 50+ ADV PO) Take by mouth.    Letta Pate DELICA LANCETS FINE MISC USE TO CHECK BLOOD SUGAR 1-2 TIMES DAILY    . Polyethyl Glycol-Propyl Glycol (SYSTANE FREE OP) Apply 1 drop to eye daily.    Marland Kitchen VITAMIN D PO Take 5,000 Units by mouth daily.      No current facility-administered medications on file prior to visit.     Review of Systems     Objective:  There were no vitals filed for this visit. BP Readings from Last 3 Encounters:  03/23/23 120/76  09/27/22 (!) 163/85  09/12/22 132/70   Wt Readings from Last 3 Encounters:  03/23/23 188 lb (85.3 kg)  09/12/22 186 lb (84.4 kg)  05/04/22 183 lb (83 kg)   There is no height or weight on file to calculate BMI.    Physical Exam     Lab Results  Component Value Date   WBC 5.7 09/12/2022   HGB 14.5 09/12/2022   HCT 44.0 09/12/2022   PLT 274.0 09/12/2022   GLUCOSE 109  (H) 03/23/2023   CHOL 168 03/23/2023   TRIG 96.0 03/23/2023   HDL 55.50 03/23/2023   LDLCALC 93 03/23/2023   ALT 12 03/23/2023   King 20 03/23/2023   NA 135 03/23/2023   K 4.0 03/23/2023   CL 101 03/23/2023   CREATININE 1.03 03/23/2023   BUN 12 03/23/2023   CO2 28 03/23/2023   TSH 1.27 06/11/2020   HGBA1C 6.2 03/23/2023   MICROALBUR <0.7 09/12/2022     Assessment & Plan:    See Problem List for Assessment and Plan of chronic medical problems.    This encounter was created in error - please disregard.

## 2023-03-13 ENCOUNTER — Encounter: Payer: 59 | Admitting: Internal Medicine

## 2023-03-13 ENCOUNTER — Encounter: Payer: Self-pay | Admitting: Internal Medicine

## 2023-03-13 DIAGNOSIS — E119 Type 2 diabetes mellitus without complications: Secondary | ICD-10-CM

## 2023-03-13 DIAGNOSIS — J449 Chronic obstructive pulmonary disease, unspecified: Secondary | ICD-10-CM

## 2023-03-13 DIAGNOSIS — J4520 Mild intermittent asthma, uncomplicated: Secondary | ICD-10-CM

## 2023-03-13 DIAGNOSIS — E7849 Other hyperlipidemia: Secondary | ICD-10-CM

## 2023-03-13 DIAGNOSIS — I10 Essential (primary) hypertension: Secondary | ICD-10-CM

## 2023-03-13 NOTE — Assessment & Plan Note (Signed)
Chronic - related to exposure from parents smoking Controlled Mild, intermittent Uses symbicort as needed only, uses albuterol rarely

## 2023-03-13 NOTE — Assessment & Plan Note (Signed)
Chronic Mild, intermittent Uses symbicort as needed only, uses albuterol rarely

## 2023-03-13 NOTE — Assessment & Plan Note (Signed)
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

## 2023-03-13 NOTE — Assessment & Plan Note (Signed)
History of statin myopathy Continue Repatha

## 2023-03-13 NOTE — Assessment & Plan Note (Addendum)
Chronic With hyperlipidemia  Lab Results  Component Value Date   HGBA1C 6.4 09/12/2022   Sugars well controlled Check A1c Continue Januvia 25 mg daily Stressed regular exercise, diabetic diet

## 2023-03-13 NOTE — Assessment & Plan Note (Signed)
Chronic Blood pressure well controlled CMP Continue amlodipine 2.5 mg twice daily, azilsartan-chlorthalidone 40-12.5 mg daily, hydralazine 50 mg 3 times daily

## 2023-03-22 ENCOUNTER — Encounter: Payer: Self-pay | Admitting: Internal Medicine

## 2023-03-22 NOTE — Progress Notes (Unsigned)
Subjective:    Patient ID: Marissa King, female    DOB: 01-Aug-1946, 77 y.o.   MRN: [id number]     HPI Marissa King is here for follow up of her chronic medical problems.  Having right heel pain extending up to calf.  Has pain when she sits down.   She is walking for exercise and plays pickle ball.    Sugar at home - a little higher with repatha.  She denies constipation - but some days does not have a full bowel.   Stool is soft.  BM daily on average.  She is taking a digestive enzyme.  She eats differently each day and knows that is likely contributing.  Medications and allergies reviewed with patient and updated if appropriate.  Current Outpatient Medications on File Prior to Visit  Medication Sig Dispense Refill   albuterol (PROVENTIL HFA;VENTOLIN HFA) 108 (90 Base) MCG/ACT inhaler Inhale into the lungs.      amLODipine (NORVASC) 2.5 MG tablet Take 1 tablet (2.5 mg total) by mouth in the morning and at bedtime. Generic ok. D/c 5 mg dose. Take 1 2.5 daily if BP >130/>80 take another dose 180 tablet 1   aspirin EC 81 MG tablet Take 81 mg by mouth daily.     Azilsartan-Chlorthalidone 40-12.5 MG TABS Take 1 tablet by mouth every morning. For blood 90 tablet 3   budesonide-formoterol (SYMBICORT) 160-4.5 MCG/ACT inhaler Inhale 2 puffs into the lungs 2 (two) times daily. Rinse mouth for asthma 1 each 11   CLARITIN 10 MG tablet Take 1 tablet (10 mg total) by mouth daily. 90 tablet 0   Collagen-Vitamin C-Biotin (COLLAGEN 1500/C) 500-50-0.8 MG CAPS Take by mouth.     Evolocumab (REPATHA SURECLICK) 140 MG/ML SOAJ INJECT 140 MG INTO THE SKIN EVERY 14 (FOURTEEN) DAYS. 2 mL 11   glucose blood test strip Check sugars 1-2 times daily. E11.9 Non-insulin dependent 200 each 12   hydrALAZINE (APRESOLINE) 50 MG tablet Take 1 tablet (50 mg total) by mouth 3 (three) times daily. 270 tablet 3   Insulin Pen Needle (PEN NEEDLES) 30G X 8 MM MISC 1 Device by Does not apply route every 14 (fourteen) days. For  repatha or preferred pen needle dispense 10 each 11   ipratropium (ATROVENT) 0.06 % nasal spray Place 2 sprays into the nose 4 (four) times daily. 45 mL 4   Multiple Vitamins-Minerals (MULTIVITAMIN WOMENS 50+ ADV PO) Take by mouth.     ONETOUCH DELICA LANCETS FINE MISC USE TO CHECK BLOOD SUGAR 1-2 TIMES DAILY     Polyethyl Glycol-Propyl Glycol (SYSTANE FREE OP) Apply 1 drop to eye daily.     sitaGLIPtin (JANUVIA) 25 MG tablet Take 1 tablet (25 mg total) by mouth daily. 90 tablet 3   VITAMIN D PO Take 5,000 Units by mouth daily.      No current facility-administered medications on file prior to visit.     Review of Systems  Constitutional:  Negative for fever.  Respiratory:  Positive for wheezing (occ with allergies). Negative for cough and shortness of breath.   Cardiovascular:  Negative for chest pain, palpitations and leg swelling.  Neurological:  Positive for headaches (occ - weather changes). Negative for light-headedness.        Little off balance at times       Objective:   Vitals:   03/23/23 1113  BP: 120/76  Pulse: 73  Temp: 98.3 F (36.8 C)  SpO2: 96%   BP Readings  from Last 3 Encounters:  03/23/23 120/76  09/27/22 (!) 163/85  09/12/22 132/70   Wt Readings from Last 3 Encounters:  03/23/23 188 lb (85.3 kg)  09/12/22 186 lb (84.4 kg)  05/04/22 183 lb (83 kg)   Body mass index is 32.27 kg/m.    Physical Exam Constitutional:      General: She is not in acute distress.    Appearance: Normal appearance.  HENT:     Head: Normocephalic and atraumatic.  Eyes:     Conjunctiva/sclera: Conjunctivae normal.  Cardiovascular:     Rate and Rhythm: Normal rate and regular rhythm.     Heart sounds: Normal heart sounds.  Pulmonary:     Effort: Pulmonary effort is normal. No respiratory distress.     Breath sounds: Normal breath sounds. No wheezing.  Musculoskeletal:        General: Tenderness (right distal heel wheer plantar fasciia inserts) present.     Cervical  back: Neck supple.     Right lower leg: No edema.     Left lower leg: No edema.  Lymphadenopathy:     Cervical: No cervical adenopathy.  Skin:    General: Skin is warm and dry.     Findings: No rash.  Neurological:     Mental Status: She is alert. Mental status is at baseline.  Psychiatric:        Mood and Affect: Mood normal.        Behavior: Behavior normal.       Diabetic Foot Exam - Simple   Simple Foot Form Diabetic Foot exam was performed with the following findings: Yes 03/23/2023 11:46 AM  Visual Inspection No deformities, no ulcerations, no other skin breakdown bilaterally: Yes Sensation Testing Intact to touch and monofilament testing bilaterally: Yes Pulse Check Posterior Tibialis and Dorsalis pulse intact bilaterally: Yes Comments      Lab Results  Component Value Date   WBC 5.7 09/12/2022   HGB 14.5 09/12/2022   HCT 44.0 09/12/2022   PLT 274.0 09/12/2022   GLUCOSE 97 09/12/2022   CHOL 171 09/12/2022   TRIG 88.0 09/12/2022   HDL 68.60 09/12/2022   LDLCALC 85 09/12/2022   ALT 20 09/12/2022   King 26 09/12/2022   NA 134 (L) 09/12/2022   K 4.0 09/12/2022   CL 98 09/12/2022   CREATININE 0.93 09/12/2022   BUN 12 09/12/2022   CO2 27 09/12/2022   TSH 1.27 06/11/2020   HGBA1C 6.4 09/12/2022   MICROALBUR <0.7 09/12/2022     Assessment & Plan:    See Problem List for Assessment and Plan of chronic medical problems.

## 2023-03-23 ENCOUNTER — Ambulatory Visit (INDEPENDENT_AMBULATORY_CARE_PROVIDER_SITE_OTHER): Payer: 59 | Admitting: Internal Medicine

## 2023-03-23 ENCOUNTER — Other Ambulatory Visit: Payer: Self-pay

## 2023-03-23 VITALS — BP 120/76 | HR 73 | Temp 98.3°F | Ht 64.0 in | Wt 188.0 lb

## 2023-03-23 DIAGNOSIS — E7849 Other hyperlipidemia: Secondary | ICD-10-CM | POA: Diagnosis not present

## 2023-03-23 DIAGNOSIS — M79671 Pain in right foot: Secondary | ICD-10-CM | POA: Insufficient documentation

## 2023-03-23 DIAGNOSIS — E1159 Type 2 diabetes mellitus with other circulatory complications: Secondary | ICD-10-CM

## 2023-03-23 DIAGNOSIS — I10 Essential (primary) hypertension: Secondary | ICD-10-CM

## 2023-03-23 DIAGNOSIS — J449 Chronic obstructive pulmonary disease, unspecified: Secondary | ICD-10-CM | POA: Diagnosis not present

## 2023-03-23 DIAGNOSIS — I152 Hypertension secondary to endocrine disorders: Secondary | ICD-10-CM | POA: Diagnosis not present

## 2023-03-23 DIAGNOSIS — E119 Type 2 diabetes mellitus without complications: Secondary | ICD-10-CM

## 2023-03-23 DIAGNOSIS — J4520 Mild intermittent asthma, uncomplicated: Secondary | ICD-10-CM

## 2023-03-23 DIAGNOSIS — G720 Drug-induced myopathy: Secondary | ICD-10-CM

## 2023-03-23 DIAGNOSIS — T466X5A Adverse effect of antihyperlipidemic and antiarteriosclerotic drugs, initial encounter: Secondary | ICD-10-CM

## 2023-03-23 LAB — COMPREHENSIVE METABOLIC PANEL
ALT: 12 U/L (ref 0–35)
AST: 20 U/L (ref 0–37)
Albumin: 3.9 g/dL (ref 3.5–5.2)
Alkaline Phosphatase: 70 U/L (ref 39–117)
BUN: 12 mg/dL (ref 6–23)
CO2: 28 mEq/L (ref 19–32)
Calcium: 9.7 mg/dL (ref 8.4–10.5)
Chloride: 101 mEq/L (ref 96–112)
Creatinine, Ser: 1.03 mg/dL (ref 0.40–1.20)
GFR: 52.93 mL/min — ABNORMAL LOW (ref >60.00)
Glucose, Bld: 109 mg/dL — ABNORMAL HIGH (ref 70–99)
Potassium: 4 mEq/L (ref 3.5–5.1)
Sodium: 135 mEq/L (ref 135–145)
Total Bilirubin: 0.4 mg/dL (ref 0.2–1.2)
Total Protein: 7.6 g/dL (ref 6.0–8.3)

## 2023-03-23 LAB — LIPID PANEL
Cholesterol: 168 mg/dL (ref 0–200)
HDL: 55.5 mg/dL (ref >39.00)
LDL Cholesterol: 93 mg/dL (ref 0–99)
NonHDL: 112.37
Total CHOL/HDL Ratio: 3
Triglycerides: 96 mg/dL (ref 0.0–149.0)
VLDL: 19.2 mg/dL (ref 0.0–40.0)

## 2023-03-23 LAB — HEMOGLOBIN A1C: Hgb A1c MFr Bld: 6.2 % (ref 4.6–6.5)

## 2023-03-23 MED ORDER — AMLODIPINE BESYLATE 2.5 MG PO TABS
2.5000 mg | ORAL_TABLET | Freq: Two times a day (BID) | ORAL | 1 refills | Status: DC
Start: 2023-03-23 — End: 2023-05-29

## 2023-03-23 MED ORDER — BUDESONIDE-FORMOTEROL FUMARATE 160-4.5 MCG/ACT IN AERO: 2.0000 | INHALATION_SPRAY | Freq: Two times a day (BID) | RESPIRATORY_TRACT | 11 refills | Status: AC

## 2023-03-23 MED ORDER — SITAGLIPTIN PHOSPHATE 25 MG PO TABS
25.0000 mg | ORAL_TABLET | Freq: Every day | ORAL | 3 refills | Status: DC
Start: 2023-03-23 — End: 2024-03-20

## 2023-03-23 MED ORDER — HYDRALAZINE HCL 50 MG PO TABS: 50.0000 mg | ORAL_TABLET | Freq: Three times a day (TID) | ORAL | 3 refills | Status: DC

## 2023-03-23 MED ORDER — AZILSARTAN-CHLORTHALIDONE 40-12.5 MG PO TABS: 1.0000 | ORAL_TABLET | Freq: Every morning | ORAL | 3 refills | Status: DC

## 2023-03-23 MED ORDER — ONETOUCH ULTRASOFT LANCETS MISC: 12 refills | Status: AC

## 2023-03-23 MED ORDER — ALBUTEROL SULFATE (2.5 MG/3ML) 0.083% IN NEBU
2.5000 mg | INHALATION_SOLUTION | Freq: Four times a day (QID) | RESPIRATORY_TRACT | 4 refills | Status: AC | PRN
Start: 2023-03-23 — End: ?

## 2023-03-23 NOTE — Assessment & Plan Note (Signed)
 History of statin myopathy Continue Repatha

## 2023-03-23 NOTE — Assessment & Plan Note (Signed)
 Chronic - related to exposure from parents smoking Controlled Mild, intermittent Uses symbicort as needed only, uses albuterol rarely

## 2023-03-23 NOTE — Assessment & Plan Note (Signed)
 Chronic With hyperlipidemia  Lab Results  Component Value Date   HGBA1C 6.4 09/12/2022   Sugars well controlled Check A1c Continue Januvia 25 mg daily Stressed regular exercise, diabetic diet

## 2023-03-23 NOTE — Assessment & Plan Note (Signed)
Chronic Blood pressure well controlled CMP Continue amlodipine 2.5 mg twice daily, azilsartan-chlorthalidone 40-12.5 mg daily, hydralazine 50 mg 3 times daily

## 2023-03-23 NOTE — Assessment & Plan Note (Signed)
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

## 2023-03-23 NOTE — Assessment & Plan Note (Addendum)
New Often feels pain in the right heel that radiates up towards her calf when she first starts walking Started about 10 days ago Wears good footwear Possible plantar fasciitis  Stretching, good shoes advised Deferred podiatry referral

## 2023-03-23 NOTE — Assessment & Plan Note (Addendum)
Chronic Mild, intermittent Occ wheezing when around cats recently Uses symbicort as needed only, uses albuterol rarely

## 2023-03-23 NOTE — Patient Instructions (Addendum)
      Blood work was ordered.   The lab is on the first floor.    Medications changes include :   none      Return in about 6 months (around 09/23/2023) for Physical Exam.

## 2023-03-29 ENCOUNTER — Telehealth: Payer: Self-pay | Admitting: Internal Medicine

## 2023-03-29 ENCOUNTER — Other Ambulatory Visit: Payer: Self-pay | Admitting: Internal Medicine

## 2023-03-29 DIAGNOSIS — I6529 Occlusion and stenosis of unspecified carotid artery: Secondary | ICD-10-CM

## 2023-03-29 DIAGNOSIS — I10 Essential (primary) hypertension: Secondary | ICD-10-CM

## 2023-03-29 DIAGNOSIS — I152 Hypertension secondary to endocrine disorders: Secondary | ICD-10-CM

## 2023-03-29 DIAGNOSIS — E7849 Other hyperlipidemia: Secondary | ICD-10-CM

## 2023-03-29 DIAGNOSIS — E1159 Type 2 diabetes mellitus with other circulatory complications: Secondary | ICD-10-CM

## 2023-03-29 DIAGNOSIS — E785 Hyperlipidemia, unspecified: Secondary | ICD-10-CM

## 2023-03-29 NOTE — Telephone Encounter (Signed)
FYI..the patient lab results has been mailed out.

## 2023-04-17 ENCOUNTER — Encounter: Payer: Self-pay | Admitting: Internal Medicine

## 2023-04-17 ENCOUNTER — Telehealth: Payer: Self-pay | Admitting: Internal Medicine

## 2023-04-17 DIAGNOSIS — Z0000 Encounter for general adult medical examination without abnormal findings: Secondary | ICD-10-CM

## 2023-04-17 DIAGNOSIS — Z1231 Encounter for screening mammogram for malignant neoplasm of breast: Secondary | ICD-10-CM

## 2023-04-17 DIAGNOSIS — M8589 Other specified disorders of bone density and structure, multiple sites: Secondary | ICD-10-CM

## 2023-04-17 NOTE — Telephone Encounter (Signed)
Pt is requesting that she would like her doctor to order her mammogram and bone density so she can get her appointments set up.

## 2023-04-17 NOTE — Telephone Encounter (Signed)
ordered

## 2023-05-17 ENCOUNTER — Ambulatory Visit
Admission: RE | Admit: 2023-05-17 | Discharge: 2023-05-17 | Disposition: A | Discharge status: Home / Self Care | Payer: 59 | Source: Ambulatory Visit | Attending: Internal Medicine

## 2023-05-17 DIAGNOSIS — Z1231 Encounter for screening mammogram for malignant neoplasm of breast: Secondary | ICD-10-CM

## 2023-05-22 ENCOUNTER — Other Ambulatory Visit: Payer: Self-pay | Admitting: Internal Medicine

## 2023-05-22 DIAGNOSIS — R928 Other abnormal and inconclusive findings on diagnostic imaging of breast: Secondary | ICD-10-CM

## 2023-05-29 ENCOUNTER — Telehealth: Payer: Self-pay | Admitting: Internal Medicine

## 2023-05-29 ENCOUNTER — Other Ambulatory Visit: Payer: Self-pay

## 2023-05-29 DIAGNOSIS — I10 Essential (primary) hypertension: Secondary | ICD-10-CM

## 2023-05-29 MED ORDER — AMLODIPINE BESYLATE 2.5 MG PO TABS: 2.5000 mg | ORAL_TABLET | Freq: Two times a day (BID) | ORAL | 2 refills | Status: DC

## 2023-05-29 NOTE — Telephone Encounter (Signed)
Refill sent today.

## 2023-05-29 NOTE — Telephone Encounter (Signed)
Pt is requesting a refill on medication amLODipine (NORVASC) 2.5 MG tablet [[id number]]  Pharmacy :[facility] Pt has  :  no refills  Last time she was seen was 8.22.2024

## 2023-06-01 ENCOUNTER — Ambulatory Visit
Admission: RE | Admit: 2023-06-01 | Discharge: 2023-06-01 | Disposition: A | Discharge status: Home / Self Care | Payer: 59 | Source: Ambulatory Visit | Attending: Internal Medicine | Admitting: Internal Medicine

## 2023-06-01 DIAGNOSIS — R928 Other abnormal and inconclusive findings on diagnostic imaging of breast: Secondary | ICD-10-CM

## 2023-06-08 ENCOUNTER — Telehealth: Payer: Self-pay | Admitting: Internal Medicine

## 2023-06-08 NOTE — Telephone Encounter (Signed)
Patient said she needs a prior authorization for amLODipine (NORVASC) 2.5 MG tablet. She said her pharmacy said it was needed for the quantity of pills.

## 2023-06-09 ENCOUNTER — Other Ambulatory Visit (HOSPITAL_COMMUNITY): Payer: Self-pay

## 2023-06-09 NOTE — Telephone Encounter (Signed)
Pharmacy Patient Advocate Encounter   Received notification from Pt Calls Messages that prior authorization for Amlodipine 2.5mg  is required/requested.   Insurance verification completed.   The patient is insured through [employer] .   Per test claim:  Per test claim, received a paid test claim for the Amlodipine. Placed a call to the mail order pharmacy and they received a paid claim and said it may have just been too soo to fill at the time.

## 2023-06-13 ENCOUNTER — Telehealth: Payer: Self-pay | Admitting: Internal Medicine

## 2023-06-13 ENCOUNTER — Other Ambulatory Visit: Payer: Self-pay

## 2023-06-13 DIAGNOSIS — I10 Essential (primary) hypertension: Secondary | ICD-10-CM

## 2023-06-13 MED ORDER — AMLODIPINE BESYLATE 2.5 MG PO TABS: 2.5000 mg | ORAL_TABLET | Freq: Two times a day (BID) | ORAL | 2 refills | Status: DC

## 2023-06-13 NOTE — Telephone Encounter (Signed)
Patient requested to have 200 sent in instead of 180 for quantity. States sometimes she has to take more than one if pressure is to high.

## 2023-06-13 NOTE — Telephone Encounter (Signed)
Patient called and said a different version of amLODipine (NORVASC) 2.5 MG tablet was sent to the pharmacy for her. She wanted to know if she can get the version she usually gets. She said this one doesn't work as well. Best callback is [phone].

## 2023-06-22 ENCOUNTER — Other Ambulatory Visit: Payer: Self-pay

## 2023-06-22 ENCOUNTER — Telehealth: Payer: Self-pay | Admitting: Internal Medicine

## 2023-06-22 DIAGNOSIS — I10 Essential (primary) hypertension: Secondary | ICD-10-CM

## 2023-06-22 MED ORDER — AMLODIPINE BESYLATE 2.5 MG PO TABS: 2.5000 mg | ORAL_TABLET | Freq: Two times a day (BID) | ORAL | 2 refills | Status: DC

## 2023-06-22 NOTE — Telephone Encounter (Signed)
Patient's pharmacy said they did not receive the medication directions for patient's amlodipine. They would like to know if it can be re-sent with the instructions again. Patient provided a phone number to call the pharmacy. It is [phone].

## 2023-06-22 NOTE — Telephone Encounter (Signed)
Script re-faxed today.

## 2023-07-03 MED ORDER — AMLODIPINE BESYLATE 2.5 MG PO TABS: 2.5000 mg | ORAL_TABLET | Freq: Every day | ORAL | 2 refills | Status: DC

## 2023-07-03 NOTE — Telephone Encounter (Signed)
Rx resent.

## 2023-07-03 NOTE — Addendum Note (Signed)
Addended by: Pincus Sanes on: 07/03/2023 04:29 PM   Modules accepted: Orders

## 2023-07-03 NOTE — Telephone Encounter (Signed)
Patient states that the directions are still wrong - please resend to [facility] with directions as follows - take 1 tablet by mouth in the morning and may take additional tablet if blood pressure is greater than 130/.80.

## 2023-07-04 ENCOUNTER — Other Ambulatory Visit: Payer: Self-pay

## 2023-07-04 DIAGNOSIS — I10 Essential (primary) hypertension: Secondary | ICD-10-CM

## 2023-07-04 MED ORDER — AMLODIPINE BESYLATE 2.5 MG PO TABS: 2.5000 mg | ORAL_TABLET | Freq: Every day | ORAL | 2 refills | Status: DC

## 2023-08-08 ENCOUNTER — Inpatient Hospital Stay: Admission: RE | Admit: 2023-08-08 | Payer: 59 | Source: Ambulatory Visit

## 2023-08-10 ENCOUNTER — Ambulatory Visit (INDEPENDENT_AMBULATORY_CARE_PROVIDER_SITE_OTHER)
Admission: RE | Admit: 2023-08-10 | Discharge: 2023-08-10 | Disposition: A | Discharge status: Home / Self Care | Payer: 59 | Source: Ambulatory Visit | Attending: Internal Medicine | Admitting: Internal Medicine

## 2023-08-10 DIAGNOSIS — M8589 Other specified disorders of bone density and structure, multiple sites: Secondary | ICD-10-CM | POA: Diagnosis not present

## 2023-09-25 ENCOUNTER — Encounter: Payer: Self-pay | Admitting: Internal Medicine

## 2023-09-25 NOTE — Progress Notes (Unsigned)
 Subjective:    Patient ID: Marissa King, female    DOB: April 04, 1946, 78 y.o.   MRN: [id number]      HPI Marissa King is here for a Physical exam and her chronic medical problems.   Her cold symptoms started 5 days ago - last Friday.   Her test for covid is positive here.  She states nasal congestion, ear pain, PND, sinus pressure, sore throat, cough that is productive at times, shortness of breath, wheezing and some headaches.  She is fatigued.  She has not been taking any of her asthma inhalers or medications.  She has taken a couple things over-the-counter for symptom relief.  She otherwise has no concerns.   Medications and allergies reviewed with patient and updated if appropriate.  Current Outpatient Medications on File Prior to Visit  Medication Sig Dispense Refill   albuterol (PROVENTIL HFA;VENTOLIN HFA) 108 (90 Base) MCG/ACT inhaler Inhale into the lungs.      albuterol (PROVENTIL) (2.5 MG/3ML) 0.083% nebulizer solution Take 3 mLs (2.5 mg total) by nebulization every 6 (six) hours as needed for wheezing or shortness of breath. 75 mL 4   amLODipine (NORVASC) 2.5 MG tablet Take 1 tablet (2.5 mg total) by mouth daily. Take an extra 2.5 mg daily if BP >130/>80 200 tablet 2   aspirin EC 81 MG tablet Take 81 mg by mouth daily.     Azilsartan-Chlorthalidone 40-12.5 MG TABS Take 1 tablet by mouth every morning. For blood 90 tablet 3   budesonide-formoterol (SYMBICORT) 160-4.5 MCG/ACT inhaler Inhale 2 puffs into the lungs 2 (two) times daily. Rinse mouth for asthma 1 each 11   CLARITIN 10 MG tablet Take 1 tablet (10 mg total) by mouth daily. 90 tablet 0   Collagen-Vitamin C-Biotin (COLLAGEN 1500/C) 500-50-0.8 MG CAPS Take by mouth.     Evolocumab (REPATHA SURECLICK) 140 MG/ML SOAJ INJECT 140 MG INTO THE SKIN EVERY 14 (FOURTEEN) DAYS. 6 mL 3   glucose blood test strip Check sugars 1-2 times daily. 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 270 tablet 3   ipratropium (ATROVENT) 0.06 % nasal spray Place 2 sprays into the nose 4 (four) times daily. 45 mL 4   Lancets (ONETOUCH ULTRASOFT) lancets Use to test sugars 1-2 times daily 100 each 12   Multiple Vitamins-Minerals (MULTIVITAMIN WOMENS 50+ ADV PO) Take by mouth.     ONETOUCH DELICA LANCETS FINE MISC USE TO CHECK BLOOD SUGAR 1-2 TIMES DAILY     Polyethyl Glycol-Propyl Glycol (SYSTANE FREE OP) Apply 1 drop to eye daily.     sitaGLIPtin (JANUVIA) 25 MG tablet Take 1 tablet (25 mg total) by mouth daily. 90 tablet 3   VITAMIN D PO Take 5,000 Units by mouth daily.      No current facility-administered medications on file prior to visit.    Review of Systems  Constitutional:  Negative for fever.  HENT:  Positive for congestion, ear pain, postnasal drip, sinus pressure and sore throat.   Eyes:  Positive for visual disturbance.  Respiratory:  Positive for cough (productive at times), shortness of breath and wheezing.   Cardiovascular:  Negative for chest pain, palpitations and leg swelling.  Gastrointestinal:  Negative for abdominal pain, blood in stool, constipation and diarrhea.       Occ gerd  Genitourinary:  Negative for dysuria.  Musculoskeletal:  Positive for back pain. Negative for  arthralgias.  Skin:  Negative for rash.  Neurological:  Positive for headaches. Negative for light-headedness.  Psychiatric/Behavioral:  Negative for dysphoric mood. The patient is not nervous/anxious.        Objective:   Vitals:   09/26/23 1051  BP: 138/80  Pulse: 76  SpO2: 97%   Filed Weights   09/26/23 1051  Weight: 191 lb (86.6 kg)   Body mass index is 36.09 kg/m.  BP Readings from Last 3 Encounters:  09/26/23 138/80  09/26/23 138/80  03/23/23 120/76    Wt Readings from Last 3 Encounters:  09/26/23 191 lb (86.6 kg)  09/26/23 191 lb 3.2 oz (86.7 kg)  03/23/23 188 lb (85.3 kg)       Physical Exam Constitutional: She  appears well-developed and well-nourished. No distress.  HENT:  Head: Normocephalic and atraumatic.  Right Ear: External ear normal. Normal ear canal and TM Left Ear: External ear normal.  Normal ear canal and TM Mouth/Throat: Oropharynx is clear and moist.  Eyes: Conjunctivae normal.  Neck: Neck supple. No tracheal deviation present. No thyromegaly present.  No carotid bruit  Cardiovascular: Normal rate, regular rhythm and normal heart sounds.   No murmur heard.  No edema. Pulmonary/Chest: Effort normal and breath sounds normal. No respiratory distress. She has no wheezes. She has no rales.  Breast: deferred   Abdominal: Soft. She exhibits no distension. There is no tenderness.  Lymphadenopathy: She has no cervical adenopathy.  Skin: Skin is warm and dry. She is not diaphoretic.  Psychiatric: She has a normal mood and affect. Her behavior is normal.     Lab Results  Component Value Date   WBC 5.7 09/12/2022   HGB 14.5 09/12/2022   HCT 44.0 09/12/2022   PLT 274.0 09/12/2022   GLUCOSE 109 (H) 03/23/2023   CHOL 168 03/23/2023   TRIG 96.0 03/23/2023   HDL 55.50 03/23/2023   LDLCALC 93 03/23/2023   ALT 12 03/23/2023   King 20 03/23/2023   NA 135 03/23/2023   K 4.0 03/23/2023   CL 101 03/23/2023   CREATININE 1.03 03/23/2023   BUN 12 03/23/2023   CO2 28 03/23/2023   TSH 1.27 06/11/2020   HGBA1C 6.2 03/23/2023   MICROALBUR <0.7 09/12/2022         Assessment & Plan:   Physical exam: Screening blood work  ordered Exercise  none Weight  obese - wants to work on weight loss Substance abuse  none   Reviewed recommended immunizations.  Discussed RSV vaccine and tdap   Health Maintenance  Topic Date Due   Diabetic kidney evaluation - Urine ACR  09/13/2023   HEMOGLOBIN A1C  09/23/2023   DTaP/Tdap/Td (1 - Tdap) 09/25/2024 (Originally 01/07/1965)   Diabetic kidney evaluation - eGFR measurement  03/22/2024   FOOT EXAM  03/22/2024   Medicare Annual Wellness (AWV)   09/25/2024   DEXA SCAN  08/09/2025   Pneumonia Vaccine 64+ Years old  Completed   Hepatitis C Screening  Completed   HPV VACCINES  Aged Out   INFLUENZA VACCINE  Discontinued   OPHTHALMOLOGY EXAM  Discontinued   Colonoscopy  Discontinued   COVID-19 Vaccine  Discontinued   Zoster Vaccines- Shingrix  Discontinued          See Problem List for Assessment and Plan of chronic medical problems.

## 2023-09-25 NOTE — Patient Instructions (Addendum)

## 2023-09-26 ENCOUNTER — Ambulatory Visit (INDEPENDENT_AMBULATORY_CARE_PROVIDER_SITE_OTHER): Payer: 59 | Admitting: Internal Medicine

## 2023-09-26 ENCOUNTER — Ambulatory Visit (INDEPENDENT_AMBULATORY_CARE_PROVIDER_SITE_OTHER): Payer: 59

## 2023-09-26 VITALS — BP 138/80 | HR 76 | Ht 61.0 in | Wt 191.0 lb

## 2023-09-26 VITALS — BP 138/80 | HR 76 | Ht 61.0 in | Wt 191.2 lb

## 2023-09-26 DIAGNOSIS — E1159 Type 2 diabetes mellitus with other circulatory complications: Secondary | ICD-10-CM

## 2023-09-26 DIAGNOSIS — U071 COVID-19: Secondary | ICD-10-CM | POA: Diagnosis not present

## 2023-09-26 DIAGNOSIS — J449 Chronic obstructive pulmonary disease, unspecified: Secondary | ICD-10-CM

## 2023-09-26 DIAGNOSIS — E66812 Obesity, class 2: Secondary | ICD-10-CM

## 2023-09-26 DIAGNOSIS — Z0000 Encounter for general adult medical examination without abnormal findings: Secondary | ICD-10-CM

## 2023-09-26 DIAGNOSIS — E7849 Other hyperlipidemia: Secondary | ICD-10-CM

## 2023-09-26 DIAGNOSIS — I152 Hypertension secondary to endocrine disorders: Secondary | ICD-10-CM | POA: Diagnosis not present

## 2023-09-26 DIAGNOSIS — E119 Type 2 diabetes mellitus without complications: Secondary | ICD-10-CM

## 2023-09-26 DIAGNOSIS — J4520 Mild intermittent asthma, uncomplicated: Secondary | ICD-10-CM

## 2023-09-26 DIAGNOSIS — Z6836 Body mass index (BMI) 36.0-36.9, adult: Secondary | ICD-10-CM

## 2023-09-26 DIAGNOSIS — G720 Drug-induced myopathy: Secondary | ICD-10-CM

## 2023-09-26 LAB — LIPID PANEL
Cholesterol: 126 mg/dL (ref 0–200)
HDL: 50 mg/dL (ref >39.00)
LDL Cholesterol: 60 mg/dL (ref 0–99)
NonHDL: 76.11
Total CHOL/HDL Ratio: 3
Triglycerides: 80 mg/dL (ref 0.0–149.0)
VLDL: 16 mg/dL (ref 0.0–40.0)

## 2023-09-26 LAB — CBC WITH DIFFERENTIAL/PLATELET
Basophils Absolute: 0 10*3/uL (ref 0.0–0.1)
Basophils Relative: 0.5 % (ref 0.0–3.0)
Eosinophils Absolute: 0.1 10*3/uL (ref 0.0–0.7)
Eosinophils Relative: 2 % (ref 0.0–5.0)
HCT: 40.4 % (ref 36.0–46.0)
Hemoglobin: 13.6 g/dL (ref 12.0–15.0)
Lymphocytes Relative: 37.7 % (ref 12.0–46.0)
Lymphs Abs: 1.1 10*3/uL (ref 0.7–4.0)
MCHC: 33.6 g/dL (ref 30.0–36.0)
MCV: 87.9 fL (ref 78.0–100.0)
Monocytes Absolute: 0.5 10*3/uL (ref 0.1–1.0)
Monocytes Relative: 17.1 % — ABNORMAL HIGH (ref 3.0–12.0)
Neutro Abs: 1.3 10*3/uL — ABNORMAL LOW (ref 1.4–7.7)
Neutrophils Relative %: 42.7 % — ABNORMAL LOW (ref 43.0–77.0)
Platelets: 222 10*3/uL (ref 150.0–400.0)
RBC: 4.6 Mil/uL (ref 3.87–5.11)
RDW: 13.4 % (ref 11.5–15.5)
WBC: 3 10*3/uL — ABNORMAL LOW (ref 4.0–10.5)

## 2023-09-26 LAB — COMPREHENSIVE METABOLIC PANEL
ALT: 14 U/L (ref 0–35)
AST: 24 U/L (ref 0–37)
Albumin: 3.8 g/dL (ref 3.5–5.2)
Alkaline Phosphatase: 63 U/L (ref 39–117)
BUN: 13 mg/dL (ref 6–23)
CO2: 29 meq/L (ref 19–32)
Calcium: 9 mg/dL (ref 8.4–10.5)
Chloride: 98 meq/L (ref 96–112)
Creatinine, Ser: 0.94 mg/dL (ref 0.40–1.20)
GFR: 58.86 mL/min — ABNORMAL LOW (ref >60.00)
Glucose, Bld: 99 mg/dL (ref 70–99)
Potassium: 3.9 meq/L (ref 3.5–5.1)
Sodium: 135 meq/L (ref 135–145)
Total Bilirubin: 0.4 mg/dL (ref 0.2–1.2)
Total Protein: 7.5 g/dL (ref 6.0–8.3)

## 2023-09-26 LAB — TSH: TSH: 0.78 u[IU]/mL (ref 0.35–5.50)

## 2023-09-26 LAB — MICROALBUMIN / CREATININE URINE RATIO
Creatinine,U: 72.7 mg/dL
Microalb Creat Ratio: 9.6 mg/g (ref 0.0–30.0)
Microalb, Ur: <0.7 mg/dL (ref 0.0–1.9)

## 2023-09-26 LAB — HEMOGLOBIN A1C: Hgb A1c MFr Bld: 6.4 % (ref 4.6–6.5)

## 2023-09-26 MED ORDER — NIRMATRELVIR/RITONAVIR (PAXLOVID)TABLET: 3.0000 | ORAL_TABLET | Freq: Two times a day (BID) | ORAL | 0 refills | Status: AC

## 2023-09-26 NOTE — Assessment & Plan Note (Signed)
 Chronic Not currently exercising-stressed the importance of regular exercise She knows she needs to start working on her weight and to work on decreasing her belly Decrease portions, healthy diet

## 2023-09-26 NOTE — Assessment & Plan Note (Addendum)
 Chronic Mild, intermittent Controlled Uses symbicort as needed only, uses albuterol rarely Has some mild symptoms likely related to COVID-has not been using the Symbicort advised that she start using it to help prevent those symptoms from worsening and monitor closely

## 2023-09-26 NOTE — Progress Notes (Signed)
 Subjective:   Marissa King is a 78 y.o. who presents for a Medicare Wellness preventive visit.  Visit Complete: In person  AWV Questionnaire: No: Patient Medicare AWV questionnaire was not completed prior to this visit.  Cardiac Risk Factors include: advanced age (>23men, >36 women);diabetes mellitus;dyslipidemia;hypertension;obesity (BMI >30kg/m2)     Objective:    Today's Vitals   09/26/23 1021  BP: 138/80  Pulse: 76  SpO2: 97%  Weight: 191 lb 3.2 oz (86.7 kg)  Height: 5\' 1"  (1.549 m)   Body mass index is 36.13 kg/m.     09/26/2023   10:18 AM 05/04/2022   11:56 AM 05/03/2021   11:25 AM 04/30/2020   11:15 AM 04/30/2019   11:18 AM  Advanced Directives  Does Patient Have a Medical Advance Directive? No No No Yes No  Type of Aeronautical engineer of [place];Living will   Does patient want to make changes to medical advance directive?    No - Patient declined Yes (MAU/Ambulatory/Procedural Areas - Information given)  Copy of Healthcare Power of Attorney in Chart?    No - copy requested   Would patient like information on creating a medical advance directive? Yes (MAU/Ambulatory/Procedural Areas - Information given) No - Patient declined No - Patient declined      Current Medications (verified) Outpatient Encounter Medications as of 09/26/2023  Medication Sig   albuterol (PROVENTIL HFA;VENTOLIN HFA) 108 (90 Base) MCG/ACT inhaler Inhale into the lungs.    albuterol (PROVENTIL) (2.5 MG/3ML) 0.083% nebulizer solution Take 3 mLs (2.5 mg total) by nebulization every 6 (six) hours as needed for wheezing or shortness of breath.   amLODipine (NORVASC) 2.5 MG tablet Take 1 tablet (2.5 mg total) by mouth daily. Take an extra 2.5 mg daily if BP >130/>80   aspirin EC 81 MG tablet Take 81 mg by mouth daily.   Azilsartan-Chlorthalidone 40-12.5 MG TABS Take 1 tablet by mouth every morning. For blood   budesonide-formoterol (SYMBICORT) 160-4.5 MCG/ACT inhaler Inhale 2 puffs  into the lungs 2 (two) times daily. Rinse mouth for asthma   CLARITIN 10 MG tablet Take 1 tablet (10 mg total) by mouth daily.   Collagen-Vitamin C-Biotin (COLLAGEN 1500/C) 500-50-0.8 MG CAPS Take by mouth.   Evolocumab (REPATHA SURECLICK) 140 MG/ML SOAJ INJECT 140 MG INTO THE SKIN EVERY 14 (FOURTEEN) DAYS.   glucose blood test strip Check sugars 1-2 times daily. 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   ipratropium (ATROVENT) 0.06 % nasal spray Place 2 sprays into the nose 4 (four) times daily.   Lancets (ONETOUCH ULTRASOFT) lancets Use to test sugars 1-2 times daily   Multiple Vitamins-Minerals (MULTIVITAMIN WOMENS 50+ ADV PO) Take by mouth.   ONETOUCH DELICA LANCETS FINE MISC USE TO CHECK BLOOD SUGAR 1-2 TIMES DAILY   Polyethyl Glycol-Propyl Glycol (SYSTANE FREE OP) Apply 1 drop to eye daily.   sitaGLIPtin (JANUVIA) 25 MG tablet Take 1 tablet (25 mg total) by mouth daily.   VITAMIN D PO Take 5,000 Units by mouth daily.    [DISCONTINUED] Insulin Pen Needle (PEN NEEDLES) 30G X 8 MM MISC 1 Device by Does not apply route every 14 (fourteen) days. For repatha or preferred pen needle dispense   No facility-administered encounter medications on file as of 09/26/2023.    Allergies (verified) Alendronate, Alprazolam, Amlodipine, Atorvastatin, Benzalkonium chloride, Clindamycin, Clonazepam, Clonidine, Clopidogrel, Escitalopram oxalate, Gabapentin, Hydrocodone-acetaminophen, Iodinated contrast  media, Iodine, Labetalol, Levofloxacin, Lisinopril-hydrochlorothiazide, Livalo [pitavastatin], Lorazepam, Losartan potassium, Losartan potassium-hctz, Meloxicam, Metformin, Metoclopramide, Mupirocin, Oxcarbazepine, Penicillins, Potassium citrate, Pravastatin, Rosuvastatin, Shellfish allergy, Simvastatin, Sitagliptin, Sodium citrate, Spironolactone, Sulfa antibiotics, Tape, Wound dressing adhesive, and Zetia [ezetimibe]    History: Past Medical History:  Diagnosis Date   Allergy    Arthritis    DDD cervical spine chronic neck pain    Asthma    with h/o chronic bronchitis   Back pain    mid back pain h/ o trauma    Diabetes mellitus without complication (HCC)    Dysphagia    dilated x 2   Frequent headaches    Gastric ulcer    GERD (gastroesophageal reflux disease)    History of chicken pox    Hyperlipidemia    Hypertension    Migraines    Thyroid disease    mng    Past Surgical History:  Procedure Laterality Date   ABDOMINAL HYSTERECTOMY     DUB 1 ovary intact    CATARACT EXTRACTION     10/2020 and 12/2020   CHOLECYSTECTOMY     09/02/19   ESOPHAGEAL DILATION     x2   Family History  Problem Relation Age of Onset   Alcohol abuse Mother    Heart disease Mother        CHF   Hypertension Mother    Hyperlipidemia Mother    Stroke Mother    Mental illness Mother    Alcohol abuse Father    Cancer Father        pancreatitic    Arthritis Sister    COPD Sister    Depression Sister    Drug abuse Sister    Hearing loss Sister    Heart disease Sister        CHF   Hyperlipidemia Sister    Hypertension Sister    Mental illness Sister    COPD Brother    Depression Brother    Heart disease Brother    Hyperlipidemia Brother    Stroke Brother    Asthma Son    Arthritis Brother    Depression Brother    Heart disease Brother    Hyperlipidemia Brother    Mental illness Brother    Mental illness Brother    Hyperlipidemia Brother    Drug abuse Brother    Diabetes Brother    Depression Brother    COPD Brother    Birth defects Brother    Arthritis Brother    Social History   Socioeconomic History   Marital status: Widowed    Spouse name: Not on file   Number of children: Not on file   Years of education: Not on file   Highest education level: Not on file  Occupational History   Not on file  Tobacco Use   Smoking status: Never   Smokeless tobacco: Never  Substance and  Sexual Activity   Alcohol use: No   Drug use: No   Sexual activity: Not Currently  Other Topics Concern   Not on file  Social History Narrative   2 sons, 4 pregnancies    Brother is Artis Rinaldo Ratel    Never smoker exposed 2nd hand    No guns    Wears seat belt    Safe in relationship, widowed lives alone   2 year college ed    Social Drivers of Health   Financial Resource Strain: Low Risk  (09/26/2023)   Overall Financial Resource Strain (CARDIA)  Difficulty of Paying Living Expenses: Not hard at all  Food Insecurity: No Food Insecurity (09/26/2023)   Hunger Vital Sign    Worried About Running Out of Food in the Last Year: Never true    Ran Out of Food in the Last Year: Never true  Transportation Needs: No Transportation Needs (09/26/2023)   PRAPARE - Administrator, Civil Service (Medical): No    Lack of Transportation (Non-Medical): No  Physical Activity: Insufficiently Active (09/26/2023)   Exercise Vital Sign    Days of Exercise per Week: 3 days    Minutes of Exercise per Session: 20 min  Stress: No Stress Concern Present (09/26/2023)   [employer] of Occupational Health - Occupational Stress Questionnaire    Feeling of Stress : Not at all  Social Connections: Moderately Isolated (09/26/2023)   Social Connection and Isolation Panel [NHANES]    Frequency of Communication with Friends and Family: More than three times a week    Frequency of Social Gatherings with Friends and Family: Once a week    Attends Religious Services: More than 4 times per year    Active Member of [employer] or Organizations: No    Attends Banker Meetings: Never    Marital Status: Widowed    Tobacco Counseling - Non Smoker Counseling given - N/A   Clinical Intake: Completed 09/26/2023  Pre-visit preparation completed: Yes  Pain : No/denies pain     BMI - recorded: 36.13 Nutritional Status: BMI > 30  Obese Nutritional Risks: None Diabetes: Yes CBG done?: No Did  pt. bring in CBG monitor from home?: No  How often do you need to have someone help you when you read instructions, pamphlets, or other written materials from your doctor or pharmacy?: 1 - Never  Interpreter Needed?: No  Information entered by :: Hassell Halim, CMA   Activities of Daily Living Completed 09/26/2023    09/26/2023   10:25 AM  In your present state of health, do you have any difficulty performing the following activities:  Hearing? 0  Vision? 0  Difficulty concentrating or making decisions? 0  Walking or climbing stairs? 0  Dressing or bathing? 0  Doing errands, shopping? 0  Preparing Food and eating ? N  Using the Toilet? N  In the past six months, have you accidently leaked urine? N  Do you have problems with loss of bowel control? N  Managing your Medications? N  Managing your Finances? N  Housekeeping or managing your Housekeeping? N    Patient Care Team: Pincus Sanes, MD as PCP - General (Internal Medicine) Lyn Records, MD (Inactive) as PCP - Cardiology (Cardiology)  Indicate any recent Medical Services you may have received from other than [facility] providers in the past year (date may be approximate).     Assessment:   This is a routine wellness examination for [facility].  Hearing/Vision screen Hearing Screening - Comments:: Denies hearing difficulties   Vision Screening - Comments:: Wears rx glasses - up to date with routine eye exams with Dr Dione Booze   Goals Addressed               This Visit's Progress     Patient Stated (pt-stated)        Patient stated that she plans to lose weight (about 20-25lbs).       Depression Screen Completed 09/26/2023    09/26/2023   10:30 AM 05/04/2022   11:27 AM 03/04/2022    1:26 PM 05/03/2021  11:24 AM 06/10/2020    1:45 PM 04/30/2020   11:10 AM 10/17/2019   10:01 AM  PHQ 2/9 Scores  PHQ - 2 Score 0 0 0 0 0 0 0    Fall Risk Completed 09/26/2023    09/26/2023   10:26 AM 05/04/2022   11:26 AM 03/04/2022    1:26  PM 05/03/2021   11:26 AM 02/25/2021   11:22 AM  Fall Risk   Falls in the past year? 0 0 0 0 0  Number falls in past yr: 0 0   0  Injury with Fall? 0 0   0  Risk for fall due to : No Fall Risks No Fall Risks     Follow up Falls prevention discussed;Falls evaluation completed Falls evaluation completed  Falls evaluation completed Falls evaluation completed    MEDICARE RISK AT HOME: Completed 09/26/2023 Medicare Risk at Home Any stairs in or around the home?: No If so, are there any without handrails?: No Home free of loose throw rugs in walkways, pet beds, electrical cords, etc?: Yes Adequate lighting in your home to reduce risk of falls?: Yes Life alert?: No Use of a cane, walker or w/c?: Yes (cane - uses as needed) Grab bars in the bathroom?: Yes Shower chair or bench in shower?: Yes Elevated toilet seat or a handicapped toilet?: Yes  TIMED UP AND GO:  Was the test performed?  No  Cognitive Function: 6CIT completed        09/26/2023   10:27 AM 05/04/2022   11:56 AM 04/30/2019   11:23 AM  6CIT Screen  What Year? 0 points 0 points 0 points  What month? 0 points 0 points 0 points  What time? 0 points 0 points 0 points  Count back from 20 0 points 0 points 0 points  Months in reverse 0 points 0 points 0 points  Repeat phrase 0 points 0 points 0 points  Total Score 0 points 0 points 0 points    Immunizations Immunization History  Administered Date(s) Administered   PFIZER(Purple Top)SARS-COV-2 Vaccination 02/19/2020, 03/13/2020   Pneumococcal Conjugate-13 10/17/2016   Pneumococcal Polysaccharide-23 02/08/2018    Screening Tests Health Maintenance  Topic Date Due   DTaP/Tdap/Td (1 - Tdap) Never done   Diabetic kidney evaluation - Urine ACR  09/13/2023   HEMOGLOBIN A1C  09/23/2023   Diabetic kidney evaluation - eGFR measurement  03/22/2024   FOOT EXAM  03/22/2024   Medicare Annual Wellness (AWV)  09/25/2024   DEXA SCAN  08/09/2025   Pneumonia Vaccine 59+ Years old   Completed   Hepatitis C Screening  Completed   HPV VACCINES  Aged Out   INFLUENZA VACCINE  Discontinued   OPHTHALMOLOGY EXAM  Discontinued   Colonoscopy  Discontinued   COVID-19 Vaccine  Discontinued   Zoster Vaccines- Shingrix  Discontinued    Health Maintenance  Health Maintenance Due  Topic Date Due   DTaP/Tdap/Td (1 - Tdap) Never done   Diabetic kidney evaluation - Urine ACR  09/13/2023   HEMOGLOBIN A1C  09/23/2023   Health Maintenance Items Addressed:09/26/2023 - Pt plans to get Tdap vaccine in 2025 at local pharmacy.  Foot Exam status: Completed 03/23/2023 w/PCP.  Due in 2025 w/PCP.  DEXA scan status: Completed on 08/10/2023 - Osteopenia and repeat due in 2-3 years  Additional Screening:  Vision Screening: Recommended annual ophthalmology exams for early detection of glaucoma and other disorders of the eye. Routine eye exam w/Dr Dione Booze in 07/2024.   Dental Screening: Recommended  annual dental exams for proper oral hygiene  Community Resource Referral / Chronic Care Management: CRR required this visit?  No   CCM required this visit?  No     Plan:     I have personally reviewed and noted the following in the patient's chart:   Medical and social history Use of alcohol, tobacco or illicit drugs  Current medications and supplements including opioid prescriptions. Patient is not currently taking opioid prescriptions. Functional ability and status Nutritional status Physical activity Advanced directives List of other physicians Hospitalizations, surgeries, and ER visits in previous 12 months Vitals Screenings to include cognitive, depression, and falls Referrals and appointments  In addition, I have reviewed and discussed with patient certain preventive protocols, quality metrics, and best practice recommendations. A written personalized care plan for preventive services as well as general preventive health recommendations were provided to patient.     Darreld Mclean, CMA   09/26/2023   After Visit Summary: (In Person-Printed) AVS printed and given to the patient  Notes: Nothing significant to report at this time.

## 2023-09-26 NOTE — Assessment & Plan Note (Addendum)
 Chronic - related to exposure from parents smoking Controlled Mild, intermittent Uses symbicort as needed only, uses albuterol rarely No current exacerbation from COVID, but encouraged her to start using the Symbicort given her symptoms of wheeze and mild shortness of breath

## 2023-09-26 NOTE — Patient Instructions (Addendum)
 [name] , Thank you for taking time to come for your Medicare Wellness Visit. I appreciate your ongoing commitment to your health goals. Please review the following plan we discussed and let me know if I can assist you in the future.   Referrals/Orders/Follow-Ups/Clinician Recommendations: Aim for 30 minutes of exercise or brisk walking, 6-8 glasses of water, and 5 servings of fruits and vegetables each day. Educated and advised to get up-to-date Tdap vaccine at local pharmacy.  This is a list of the screening recommended for you and due dates:  Health Maintenance  Topic Date Due   DTaP/Tdap/Td vaccine (1 - Tdap) Never done   Yearly kidney health urinalysis for diabetes  09/13/2023   Hemoglobin A1C  09/23/2023   Yearly kidney function blood test for diabetes  03/22/2024   Complete foot exam   03/22/2024   Medicare Annual Wellness Visit  09/25/2024   DEXA scan (bone density measurement)  08/09/2025   Pneumonia Vaccine  Completed   Hepatitis C Screening  Completed   HPV Vaccine  Aged Out   Flu Shot  Discontinued   Eye exam for diabetics  Discontinued   Colon Cancer Screening  Discontinued   COVID-19 Vaccine  Discontinued   Zoster (Shingles) Vaccine  Discontinued    Advanced directives: (Provided) Advance directive discussed with you today. I have provided a copy for you to complete at home and have notarized. Once this is complete, please bring a copy in to our office so we can scan it into your chart.   Next Medicare Annual Wellness Visit scheduled for next year: Yes - 09/2024

## 2023-09-26 NOTE — Assessment & Plan Note (Signed)
 History of statin myopathy-statin intolerant Continue Repatha

## 2023-09-26 NOTE — Assessment & Plan Note (Signed)
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

## 2023-09-26 NOTE — Assessment & Plan Note (Signed)
 Acute Symptoms started 5 days ago COVID test here today is positive Will start Paxlovid-discussed possible side effects Discussed symptomatic treatment with over-the-counter medications Advised starting Symbicort or nebulizer at home regularly to help prevent lung symptoms from worsening and improve current symptoms Monitor symptoms closely and if symptoms are not improving or worsening she will call

## 2023-09-26 NOTE — Assessment & Plan Note (Addendum)
 Chronic Blood pressure borderline controlled CMP, CBC Continue amlodipine 2.5 mg twice daily, azilsartan-chlorthalidone 40-12.5 mg daily, hydralazine 50 mg 3 times daily

## 2023-09-26 NOTE — Assessment & Plan Note (Signed)
 Chronic With hyperlipidemia  Lab Results  Component Value Date   HGBA1C 6.2 03/23/2023   Sugars well controlled Check A1c Continue Januvia 25 mg daily Stressed regular exercise, diabetic diet

## 2023-10-04 ENCOUNTER — Ambulatory Visit: Payer: Self-pay | Admitting: Internal Medicine

## 2023-10-04 NOTE — Telephone Encounter (Signed)
 Chief Complaint: Rash Symptoms: itching, pain Frequency: Pt reports halfway through Paxlovid course Pertinent Negatives: Patient denies fever, N/V Disposition: [] ED /[] Urgent Care (no appt availability in office) / [x] Appointment(In office/virtual)/ []  [facility]/ [] Home Care/ [] Refused Recommended Disposition /[] [facility]/ []  Follow-up with PCP Additional Notes: Pt reports rash began after halfway through her Paxlovid coursse. She notes raised, red itchy rash to buttocks. OV scheduled, This RN educated pt on home care, new-worsening symptoms, when to call back/seek emergent care. Pt verbalized understanding and agrees to plan.    Copied from CRM [id number]. Topic: Clinical - Red Word Triage >> Oct 04, 2023  4:45 PM [place] J wrote: [facility] that prompted transfer to Nurse Triage: Patient is having extreme itching, pain, and discomfort on her bottom area. Reason for Disposition  [1] Localized rash is very painful AND [2] no fever  Answer Assessment - Initial Assessment Questions 1. APPEARANCE of RASH: "Describe the rash."      Little red bumps 2. LOCATION: "Where is the rash located?"      Bottom  5. ONSET: "When did the rash start?"      Midway through Paxlovid course 6. ITCHING: "Does the rash itch?" If Yes, ask: "How bad is the itch?"  (Scale 0-10; or none, mild, moderate, severe)     Severe 7. PAIN: "Does the rash hurt?" If Yes, ask: "How bad is the pain?"  (Scale 0-10; or none, mild, moderate, severe)    - NONE (0): no pain    - MILD (1-3): doesn't interfere with normal activities     - MODERATE (4-7): interferes with normal activities or awakens from sleep     - SEVERE (8-10): excruciating pain, unable to do any normal activities     6/10 8. OTHER SYMPTOMS: "Do you have any other symptoms?" (e.g., fever)     Low grade fever a few days ago  Protocols used: Rash or Redness - Localized-A-AH

## 2023-10-05 ENCOUNTER — Ambulatory Visit (INDEPENDENT_AMBULATORY_CARE_PROVIDER_SITE_OTHER): Admitting: Physician Assistant

## 2023-10-05 ENCOUNTER — Encounter: Payer: Self-pay | Admitting: Physician Assistant

## 2023-10-05 VITALS — BP 125/84 | HR 93 | Ht 61.0 in | Wt 190.8 lb

## 2023-10-05 DIAGNOSIS — K644 Residual hemorrhoidal skin tags: Secondary | ICD-10-CM

## 2023-10-05 DIAGNOSIS — K5900 Constipation, unspecified: Secondary | ICD-10-CM

## 2023-10-05 MED ORDER — HYDROCORTISONE ACETATE 25 MG RE SUPP: 25.0000 mg | Freq: Two times a day (BID) | RECTAL | 0 refills | Status: AC

## 2023-10-05 MED ORDER — HYDROCORTISONE ACETATE 25 MG RE SUPP
25.0000 mg | Freq: Two times a day (BID) | RECTAL | 0 refills | Status: DC
Start: 2023-10-05 — End: 2023-10-05

## 2023-10-05 NOTE — Progress Notes (Signed)
 Established patient visit   Patient: Marissa King   DOB: August 24, 1945   78 y.o. Female  MRN: [id number] Visit Date: 10/05/2023  Today's healthcare provider: Alfredia Ferguson, PA-C   Cc. Rash, rectal bleeding  Subjective     Patient presents today as since she has taken "COVID medicine" she has been experiencing "blisters and a rash" on her vaginal and rectal area.  Reports a sensation of her "rectum and vagina falling out" denies any vaginal symptoms i.e. discharge or pain.  Reports that she has been significantly constipated since taking the "COVID medicine".  Reports some intermittent rectal bleeding.  Medications: Outpatient Medications Prior to Visit  Medication Sig   albuterol (PROVENTIL HFA;VENTOLIN HFA) 108 (90 Base) MCG/ACT inhaler Inhale into the lungs.    albuterol (PROVENTIL) (2.5 MG/3ML) 0.083% nebulizer solution Take 3 mLs (2.5 mg total) by nebulization every 6 (six) hours as needed for wheezing or shortness of breath.   amLODipine (NORVASC) 2.5 MG tablet Take 1 tablet (2.5 mg total) by mouth daily. Take an extra 2.5 mg daily if BP >130/>80   aspirin EC 81 MG tablet Take 81 mg by mouth daily.   Azilsartan-Chlorthalidone 40-12.5 MG TABS Take 1 tablet by mouth every morning. For blood   budesonide-formoterol (SYMBICORT) 160-4.5 MCG/ACT inhaler Inhale 2 puffs into the lungs 2 (two) times daily. Rinse mouth for asthma   CLARITIN 10 MG tablet Take 1 tablet (10 mg total) by mouth daily.   Collagen-Vitamin C-Biotin (COLLAGEN 1500/C) 500-50-0.8 MG CAPS Take by mouth.   Evolocumab (REPATHA SURECLICK) 140 MG/ML SOAJ INJECT 140 MG INTO THE SKIN EVERY 14 (FOURTEEN) DAYS.   glucose blood test strip Check sugars 1-2 times daily. 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   ipratropium (ATROVENT) 0.06 % nasal spray Place 2 sprays into the nose 4 (four) times daily.   Lancets (ONETOUCH ULTRASOFT)  lancets Use to test sugars 1-2 times daily   Multiple Vitamins-Minerals (MULTIVITAMIN WOMENS 50+ ADV PO) Take by mouth.   ONETOUCH DELICA LANCETS FINE MISC USE TO CHECK BLOOD SUGAR 1-2 TIMES DAILY   Polyethyl Glycol-Propyl Glycol (SYSTANE FREE OP) Apply 1 drop to eye daily.   sitaGLIPtin (JANUVIA) 25 MG tablet Take 1 tablet (25 mg total) by mouth daily.   VITAMIN D PO Take 5,000 Units by mouth daily.    No facility-administered medications prior to visit.    Review of Systems  Constitutional:  Negative for fatigue and fever.  Respiratory:  Negative for cough and shortness of breath.   Cardiovascular:  Negative for chest pain and leg swelling.  Gastrointestinal:  Positive for blood in stool, constipation and rectal pain. Negative for abdominal pain.  Skin:  Positive for rash.  Neurological:  Negative for dizziness and headaches.       Objective    BP 125/84   Pulse 93   Ht 5\' 1"  (1.549 m)   Wt 190 lb 12.8 oz (86.5 kg)   BMI 36.05 kg/m    Physical Exam Vitals reviewed.  Constitutional:      Appearance: She is not ill-appearing.  HENT:     Head: Normocephalic.  Eyes:     Conjunctiva/sclera: Conjunctivae normal.  Cardiovascular:     Rate and Rhythm: Normal rate.  Pulmonary:     Effort: Pulmonary effort is normal. No respiratory distress.  Genitourinary:    Comments: Lower vulvar area there are a  few nonerythematous, well-circumscribed masses potentially consistent with cysts. I did not complete a full pelvic exam but no obvious prolapse. Rectal area there are a few external nonbleeding hemorrhoids. Neurological:     Mental Status: She is alert and oriented to person, place, and time.  Psychiatric:        Mood and Affect: Mood normal.        Behavior: Behavior normal.      No results found for any visits on 10/05/23.  Assessment & Plan    External hemorrhoid -     Hydrocortisone Acetate; Place 1 suppository (25 mg total) rectally 2 (two) times daily.  Dispense: 12  suppository; Refill: 0  Constipation, unspecified constipation type  Patient was seen at her primary care provider's office 2/25 with COVID symptoms, COVID test was positive and she was started on Paxlovid.  This is likely the cause of her constipation.  On exam there are a few lesions consistent with cysts on her vulva.  Patient reports these are chronic and stable.  No other obvious rash on her vulvar area.  In her rectum there is no apparent rash but there are a few external hemorrhoids.  None actively bleeding.  Recommending hydrocortisone suppository.  If symptoms persist would recommend following up with PCP versus GYN.  Difficult to assess if this sensation of "things falling down" is secondary to prolapse or pressure secondary to constipation.  For constipation recommending starting MiraLAX once to twice a day.  Return if symptoms worsen or fail to improve.       Alfredia Ferguson, PA-C  [facility] [phone] (phone) [phone] (fax)  [facility]

## 2023-11-24 ENCOUNTER — Other Ambulatory Visit: Payer: Self-pay | Admitting: Internal Medicine

## 2023-11-24 DIAGNOSIS — I10 Essential (primary) hypertension: Secondary | ICD-10-CM

## 2023-11-24 MED ORDER — AZILSARTAN-CHLORTHALIDONE 40-12.5 MG PO TABS: 1.0000 | ORAL_TABLET | Freq: Every morning | ORAL | 3 refills | Status: DC

## 2023-11-24 NOTE — Telephone Encounter (Signed)
 Copied from CRM [id number]. Topic: Clinical - Medication Refill >> Nov 24, 2023  1:04 PM Freya Jesus wrote: Most Recent Primary Care Visit:  Provider: Trenton Frock  Department: [facility]  Visit Type: ACUTE  Date: 10/05/2023  Medication: Azilsartan-Chlorthalidone  40-12.5 MG TABS [[id number]]  Has the patient contacted their pharmacy? No (Agent: If no, request that the patient contact the pharmacy for the refill. If patient does not wish to contact the pharmacy document the reason why and proceed with request.) (Agent: If yes, when and what did the pharmacy advise?) [facility] [facility] called requesting a prescription with a quantity of 100 be sent to pharmacy.  Is this the correct pharmacy for this prescription? Yes If no, delete pharmacy and type the correct one.  This is the patient's preferred pharmacy:  [id number] - [place], [place] - [address] [place] [address] Phone: [phone] Fax: [phone]    Has the prescription been filled recently? No  Is the patient out of the medication? No, only have a week left  Has the patient been seen for an appointment in the last year OR does the patient have an upcoming appointment? Yes  Can we respond through MyChart? No  Agent: Please be advised that Rx refills may take up to 3 business days. We ask that you follow-up with your pharmacy.

## 2023-11-24 NOTE — Telephone Encounter (Signed)
 FYI- called pharmacy and was advised that patient picked up requested medication on 11/10/23.

## 2024-01-17 ENCOUNTER — Telehealth: Payer: Self-pay | Admitting: Internal Medicine

## 2024-01-17 DIAGNOSIS — I10 Essential (primary) hypertension: Secondary | ICD-10-CM

## 2024-01-17 MED ORDER — AZILSARTAN-CHLORTHALIDONE 40-12.5 MG PO TABS: 1.0000 | ORAL_TABLET | Freq: Every morning | ORAL | 3 refills | Status: DC

## 2024-01-17 NOTE — Telephone Encounter (Signed)
 Copied from CRM [id number]. Topic: Clinical - Medication Refill >> Jan 17, 2024 12:50 PM Peg Bouton F wrote: Medication:  Azilsartan-Chlorthalidone  40-12.5 MG TABS  Abraham Hoffmann pharmacist with [facility] is calling in this refill for pt. Wants to do a 100-day supply as is approved by her insurance   Has the patient contacted their pharmacy? Yes (Agent: If no, request that the patient contact the pharmacy for the refill. If patient does not wish to contact the pharmacy document the reason why and proceed with request.) (Agent: If yes, when and what did the pharmacy advise?)  This is the patient's preferred pharmacy:  [id number] - [place], [place] - [address] [place] [address] Phone: [phone] Fax: [phone]   Is this the correct pharmacy for this prescription? Yes If no, delete pharmacy and type the correct one.   Has the prescription been filled recently? No  Is the patient out of the medication? No  Has the patient been seen for an appointment in the last year OR does the patient have an upcoming appointment? Yes  Can we respond through MyChart? No  Agent: Please be advised that Rx refills may take up to 3 business days. We ask that you follow-up with your pharmacy.

## 2024-02-05 ENCOUNTER — Telehealth: Payer: Self-pay

## 2024-02-05 NOTE — Telephone Encounter (Signed)
 Copied from CRM [id number]. Topic: Referral - Request for Referral >> Feb 05, 2024  2:32 PM Berneda FALCON wrote: Did the patient discuss referral with their provider in the last year? Yes (If No - schedule appointment) (If Yes - send message)  Appointment offered? No  Type of order/referral and detailed reason for visit: Pt states she has an ingrown internal pimple on her left breast that is getting larger. She would like a referral to dermatology for this and talked to Dr. Geofm about this last year.  Preference of office, provider, location: Pt would like to go anywhere that does a good job please. States this has to be lanced and taken out. Would prefer female provider as well please.  If referral order, have you been seen by this specialty before? Yes, but did not want to go back to them (told her that black people do not get rosacea)-Does not know the  name of it (If Yes, this issue or another issue? When? Where?  Can we respond through MyChart? No  Please call [phone] patient callback

## 2024-02-09 NOTE — Telephone Encounter (Signed)
 Spoke with patient today.  She will need appointment so we can refer her.  She was currently at [facility] being seen for it.  We agreed that I would check back in with her on Monday to follow up and see if appointment is still needed based on what they are able to do today.

## 2024-02-14 NOTE — Telephone Encounter (Signed)
 Spoke with patient today.  She was put on abx and will follow up with her on Monday to see how she is and if we need to bring her back in for evaluation follow up from UC.

## 2024-02-20 NOTE — Telephone Encounter (Signed)
 Spoke with patient today.  Cyst popped and she is to call me back tomorrow morning if she needs to be seen.  If she calls please transfer her to me.  Thanks!

## 2024-02-21 NOTE — Telephone Encounter (Signed)
 Copied from CRM [id number]. Topic: General - Call Back - No Documentation >> Feb 21, 2024 11:15 AM Shereese L wrote: Reason for CRM: patient called in to return a call to CMA [employer]. Contacted office and was adv that she is with a patient and CMA will be giving her a call back when finished

## 2024-02-21 NOTE — Telephone Encounter (Signed)
 Spoke with patient today.  She will continue to monitor area and call if she starts to have pain.

## 2024-02-25 ENCOUNTER — Other Ambulatory Visit: Payer: Self-pay | Admitting: Internal Medicine

## 2024-02-25 DIAGNOSIS — E7849 Other hyperlipidemia: Secondary | ICD-10-CM

## 2024-02-25 DIAGNOSIS — I10 Essential (primary) hypertension: Secondary | ICD-10-CM

## 2024-02-25 DIAGNOSIS — I152 Hypertension secondary to endocrine disorders: Secondary | ICD-10-CM

## 2024-02-25 DIAGNOSIS — E785 Hyperlipidemia, unspecified: Secondary | ICD-10-CM

## 2024-02-25 DIAGNOSIS — I6529 Occlusion and stenosis of unspecified carotid artery: Secondary | ICD-10-CM

## 2024-03-20 ENCOUNTER — Other Ambulatory Visit: Payer: Self-pay

## 2024-03-20 ENCOUNTER — Telehealth: Payer: Self-pay

## 2024-03-20 DIAGNOSIS — E1159 Type 2 diabetes mellitus with other circulatory complications: Secondary | ICD-10-CM

## 2024-03-20 MED ORDER — SITAGLIPTIN PHOSPHATE 25 MG PO TABS: 25.0000 mg | ORAL_TABLET | Freq: Every day | ORAL | 1 refills | Status: DC

## 2024-03-20 NOTE — Telephone Encounter (Signed)
 Copied from CRM #[id number]. Topic: Clinical - Medication Question >> Mar 19, 2024  5:29 PM Harlene ORN wrote: Reason for CRM: Nona [facility]ENWO[facility] Phone: [phone]  REP from pharmacy called for the patient. Needing a 100 day supply of januvia . [facility] [place], [place]

## 2024-03-20 NOTE — Telephone Encounter (Signed)
Refill sent in today

## 2024-03-27 ENCOUNTER — Encounter: Payer: Self-pay | Admitting: Internal Medicine

## 2024-03-27 NOTE — Patient Instructions (Addendum)
      Blood work was ordered.       Medications changes include :   None     Return in about 6 months (around 09/28/2024) for Physical Exam.

## 2024-03-27 NOTE — Progress Notes (Unsigned)
 Subjective:    Patient ID: Marissa King, female    DOB: 12/10/1945, 78 y.o.   MRN: [id number]     HPI Marissa King is here for follow up of her chronic medical problems.  Her sugars have been higher - she thinks it is the repatha .    She is watching what she eats and has lost weight.  She walks when she cans.   Medications and allergies reviewed with patient and updated if appropriate.  Current Outpatient Medications on File Prior to Visit  Medication Sig Dispense Refill   albuterol  (PROVENTIL  HFA;VENTOLIN  HFA) 108 (90 Base) MCG/ACT inhaler Inhale into the lungs.      albuterol  (PROVENTIL ) (2.5 MG/3ML) 0.083% nebulizer solution Take 3 mLs (2.5 mg total) by nebulization every 6 (six) hours as needed for wheezing or shortness of breath. 75 mL 4   amLODipine  (NORVASC ) 2.5 MG tablet Take 1 tablet (2.5 mg total) by mouth daily. Take an extra 2.5 mg daily if BP >130/>80 200 tablet 2   aspirin EC 81 MG tablet Take 81 mg by mouth daily.     budesonide -formoterol  (SYMBICORT ) 160-4.5 MCG/ACT inhaler Inhale 2 puffs into the lungs 2 (two) times daily. Rinse mouth for asthma 1 each 11   CLARITIN  10 MG tablet Take 1 tablet (10 mg total) by mouth daily. 90 tablet 0   Collagen-Vitamin C-Biotin (COLLAGEN 1500/C) 500-50-0.8 MG CAPS Take by mouth.     Evolocumab  (REPATHA  SURECLICK) 140 MG/ML SOAJ INJECT 140 MG INTO THE SKIN EVERY 14 (FOURTEEN) DAYS. 6 mL 3   glucose blood test strip Check sugars 1-2 times daily. 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 270 tablet 3   hydrocortisone  (ANUSOL -HC) 25 MG suppository Place 1 suppository (25 mg total) rectally 2 (two) times daily. 12 suppository 0   ipratropium (ATROVENT ) 0.06 % nasal spray Place 2 sprays into the nose 4 (four) times daily. 45 mL 4   Lancets (ONETOUCH ULTRASOFT) lancets Use to test sugars 1-2 times daily 100 each 12   Multiple Vitamins-Minerals  (MULTIVITAMIN WOMENS 50+ ADV PO) Take by mouth.     ONETOUCH DELICA LANCETS FINE MISC USE TO CHECK BLOOD SUGAR 1-2 TIMES DAILY     Polyethyl Glycol-Propyl Glycol (SYSTANE FREE OP) Apply 1 drop to eye daily.     sitaGLIPtin  (JANUVIA ) 25 MG tablet Take 1 tablet (25 mg total) by mouth daily. 100 tablet 1   VITAMIN D  PO Take 5,000 Units by mouth daily.      No current facility-administered medications on file prior to visit.     Review of Systems  Constitutional:  Negative for fever.  Respiratory:  Negative for cough, shortness of breath and wheezing.   Cardiovascular:  Positive for leg swelling (occ if on feet long time). Negative for chest pain and palpitations.  Neurological:  Positive for light-headedness (when she stands - once in a while). Negative for headaches.       Objective:   Vitals:   03/28/24 1038  BP: 126/78  Pulse: 65  Temp: 98 F (36.7 C)  SpO2: 97%   BP Readings from Last 3 Encounters:  03/28/24 126/78  10/05/23 125/84  09/26/23 138/80   Wt Readings from Last 3 Encounters:  03/28/24 182 lb (82.6 kg)  10/05/23 190 lb 12.8 oz (86.5 kg)  09/26/23 191 lb (86.6 kg)   Body mass index is 34.39  kg/m.    Physical Exam Constitutional:      General: She is not in acute distress.    Appearance: Normal appearance.  HENT:     Head: Normocephalic and atraumatic.  Eyes:     Conjunctiva/sclera: Conjunctivae normal.  Neck:     Comments: thyromegaly Cardiovascular:     Rate and Rhythm: Normal rate and regular rhythm.     Heart sounds: Normal heart sounds.  Pulmonary:     Effort: Pulmonary effort is normal. No respiratory distress.     Breath sounds: Normal breath sounds. No wheezing.  Musculoskeletal:     Cervical back: Neck supple.     Right lower leg: No edema.     Left lower leg: No edema.  Lymphadenopathy:     Cervical: No cervical adenopathy.  Skin:    General: Skin is warm and dry.     Findings: No rash.  Neurological:     Mental Status: She is  alert. Mental status is at baseline.  Psychiatric:        Mood and Affect: Mood normal.        Behavior: Behavior normal.        Lab Results  Component Value Date   WBC 3.0 (L) 09/26/2023   HGB 13.6 09/26/2023   HCT 40.4 09/26/2023   PLT 222.0 09/26/2023   GLUCOSE 99 09/26/2023   CHOL 126 09/26/2023   TRIG 80.0 09/26/2023   HDL 50.00 09/26/2023   LDLCALC 60 09/26/2023   ALT 14 09/26/2023   AST 24 09/26/2023   NA 135 09/26/2023   K 3.9 09/26/2023   CL 98 09/26/2023   CREATININE 0.94 09/26/2023   BUN 13 09/26/2023   CO2 29 09/26/2023   TSH 0.78 09/26/2023   HGBA1C 6.4 09/26/2023   MICROALBUR <0.7 09/26/2023     Assessment & Plan:    See Problem List for Assessment and Plan of chronic medical problems.

## 2024-03-28 ENCOUNTER — Ambulatory Visit: Payer: Self-pay | Admitting: Internal Medicine

## 2024-03-28 ENCOUNTER — Ambulatory Visit (INDEPENDENT_AMBULATORY_CARE_PROVIDER_SITE_OTHER): Payer: 59 | Admitting: Internal Medicine

## 2024-03-28 VITALS — BP 126/78 | HR 65 | Temp 98.0°F | Ht 61.0 in | Wt 182.0 lb

## 2024-03-28 DIAGNOSIS — E1159 Type 2 diabetes mellitus with other circulatory complications: Secondary | ICD-10-CM | POA: Diagnosis not present

## 2024-03-28 DIAGNOSIS — Z7984 Long term (current) use of oral hypoglycemic drugs: Secondary | ICD-10-CM

## 2024-03-28 DIAGNOSIS — G720 Drug-induced myopathy: Secondary | ICD-10-CM

## 2024-03-28 DIAGNOSIS — E119 Type 2 diabetes mellitus without complications: Secondary | ICD-10-CM | POA: Diagnosis not present

## 2024-03-28 DIAGNOSIS — T466X5A Adverse effect of antihyperlipidemic and antiarteriosclerotic drugs, initial encounter: Secondary | ICD-10-CM

## 2024-03-28 DIAGNOSIS — J4520 Mild intermittent asthma, uncomplicated: Secondary | ICD-10-CM | POA: Diagnosis not present

## 2024-03-28 DIAGNOSIS — E1169 Type 2 diabetes mellitus with other specified complication: Secondary | ICD-10-CM

## 2024-03-28 DIAGNOSIS — E042 Nontoxic multinodular goiter: Secondary | ICD-10-CM

## 2024-03-28 DIAGNOSIS — I10 Essential (primary) hypertension: Secondary | ICD-10-CM

## 2024-03-28 DIAGNOSIS — I152 Hypertension secondary to endocrine disorders: Secondary | ICD-10-CM

## 2024-03-28 DIAGNOSIS — E785 Hyperlipidemia, unspecified: Secondary | ICD-10-CM

## 2024-03-28 DIAGNOSIS — J449 Chronic obstructive pulmonary disease, unspecified: Secondary | ICD-10-CM

## 2024-03-28 LAB — COMPREHENSIVE METABOLIC PANEL WITH GFR
ALT: 13 U/L (ref 0–35)
AST: 21 U/L (ref 0–37)
Albumin: 4 g/dL (ref 3.5–5.2)
Alkaline Phosphatase: 78 U/L (ref 39–117)
BUN: 12 mg/dL (ref 6–23)
CO2: 29 meq/L (ref 19–32)
Calcium: 9.6 mg/dL (ref 8.4–10.5)
Chloride: 100 meq/L (ref 96–112)
Creatinine, Ser: 0.94 mg/dL (ref 0.40–1.20)
GFR: 58.65 mL/min — ABNORMAL LOW (ref >60.00)
Glucose, Bld: 97 mg/dL (ref 70–99)
Potassium: 3.9 meq/L (ref 3.5–5.1)
Sodium: 137 meq/L (ref 135–145)
Total Bilirubin: 0.5 mg/dL (ref 0.2–1.2)
Total Protein: 7.9 g/dL (ref 6.0–8.3)

## 2024-03-28 LAB — LIPID PANEL
Cholesterol: 145 mg/dL (ref 0–200)
HDL: 54.2 mg/dL (ref >39.00)
LDL Cholesterol: 67 mg/dL (ref 0–99)
NonHDL: 90.75
Total CHOL/HDL Ratio: 3
Triglycerides: 117 mg/dL (ref 0.0–149.0)
VLDL: 23.4 mg/dL (ref 0.0–40.0)

## 2024-03-28 LAB — HEMOGLOBIN A1C: Hgb A1c MFr Bld: 6.5 % (ref 4.6–6.5)

## 2024-03-28 MED ORDER — AZILSARTAN-CHLORTHALIDONE 40-12.5 MG PO TABS: 1.0000 | ORAL_TABLET | Freq: Every morning | ORAL | 3 refills | Status: AC

## 2024-03-28 MED ORDER — SITAGLIPTIN PHOSPHATE 25 MG PO TABS: 25.0000 mg | ORAL_TABLET | Freq: Every day | ORAL | 1 refills | Status: AC

## 2024-03-28 NOTE — Assessment & Plan Note (Signed)
 Chronic With hyperlipidemia  Lab Results  Component Value Date   HGBA1C 6.4 09/26/2023   Sugars well controlled Check A1c Continue Januvia  25 mg daily Stressed regular exercise, diabetic diet

## 2024-03-28 NOTE — Assessment & Plan Note (Signed)
 History of statin myopathy-statin intolerant Continue Repatha

## 2024-03-28 NOTE — Assessment & Plan Note (Signed)
 Chronic - related to exposure from parents smoking Controlled Mild, intermittent Uses symbicort as needed only, uses albuterol rarely

## 2024-03-28 NOTE — Assessment & Plan Note (Signed)
 Chronic Mild, intermittent Controlled Uses symbicort  as needed only, uses albuterol  rarely

## 2024-03-28 NOTE — Assessment & Plan Note (Signed)
 Chronic Was going to have surgery in 2022 It is compressive - it hurts and affects her voice She is not sure if she wants to have surgery - consider seeing Dr Eletha again

## 2024-03-28 NOTE — Addendum Note (Signed)
 Addended by: GEOFM GLADE PARAS on: 03/28/2024 11:29 AM   Modules accepted: Orders

## 2024-03-28 NOTE — Assessment & Plan Note (Signed)
 Chronic Blood pressure borderline controlled CMP Continue amlodipine  2.5 mg twice daily, azilsartan-chlorthalidone  40-12.5 mg daily, hydralazine  50 mg 3 times daily

## 2024-03-28 NOTE — Assessment & Plan Note (Signed)
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

## 2024-03-31 ENCOUNTER — Other Ambulatory Visit: Payer: Self-pay | Admitting: Internal Medicine

## 2024-03-31 DIAGNOSIS — I10 Essential (primary) hypertension: Secondary | ICD-10-CM

## 2024-04-16 ENCOUNTER — Other Ambulatory Visit: Payer: Self-pay | Admitting: Internal Medicine

## 2024-04-16 DIAGNOSIS — I10 Essential (primary) hypertension: Secondary | ICD-10-CM

## 2024-05-21 ENCOUNTER — Telehealth: Payer: Self-pay

## 2024-05-21 NOTE — Telephone Encounter (Signed)
 Copied from CRM #[id number]. Topic: Clinical - Medical Advice >> May 21, 2024  1:47 PM Marissa King wrote: Reason for CRM: patient called in stated she has two boils again on her bottom, wanted to know if anything could be called in regarding this .Patient also wanted to know regarding bone density , would like a callback

## 2024-05-22 NOTE — Telephone Encounter (Signed)
 Spoke with patient today.

## 2024-05-23 ENCOUNTER — Ambulatory Visit (INDEPENDENT_AMBULATORY_CARE_PROVIDER_SITE_OTHER): Admitting: Family Medicine

## 2024-05-23 ENCOUNTER — Encounter: Payer: Self-pay | Admitting: Family Medicine

## 2024-05-23 VITALS — BP 128/76 | HR 72 | Temp 98.1°F | Resp 18 | Ht 61.0 in | Wt 181.0 lb

## 2024-05-23 DIAGNOSIS — L0292 Furuncle, unspecified: Secondary | ICD-10-CM | POA: Diagnosis not present

## 2024-05-23 MED ORDER — DOXYCYCLINE HYCLATE 50 MG PO CAPS: ORAL_CAPSULE | ORAL | 1 refills | Status: AC

## 2024-05-23 NOTE — Patient Instructions (Signed)
 Ice/cold pack over area for 10-15 min twice daily.  OK to take Tylenol  1000 mg (2 extra strength tabs) or 975 mg (3 regular strength tabs) every 6 hours as needed.  Let us  know if you need anything.

## 2024-05-23 NOTE — Progress Notes (Signed)
 Chief Complaint  Patient presents with   skin check    Boil -- draining onset: 2 week     Marissa King is a 78 y.o. female here for a skin complaint.  Duration: 2 weeks Location: vaginal region Pruritic? No Painful? Yes New topicals? No Drainage? Yes Trauma? No Other associated symptoms: one popped and now feels better No fevers or spreading redness Therapies tried thus far: baking soda  Past Medical History:  Diagnosis Date   Allergy    Arthritis    DDD cervical spine chronic neck pain    Asthma    with h/o chronic bronchitis   Back pain    mid back pain h/ o trauma    Diabetes mellitus without complication (HCC)    Dysphagia    dilated x 2   Frequent headaches    Gastric ulcer    GERD (gastroesophageal reflux disease)    History of chicken pox    Hyperlipidemia    Hypertension    Migraines    Thyroid  disease    mng     BP 128/76   Pulse 72   Temp 98.1 F (36.7 C)   Resp 18   Ht 5' 1 (1.549 m)   Wt 181 lb (82.1 kg)   SpO2 97%   BMI 34.20 kg/m  Gen: awake, alert, appearing stated age Lungs: No accessory muscle use Skin: declined exam for today. Psych: Age appropriate judgment and insight  Recurrent boils - Plan: doxycycline (VIBRAMYCIN) 50 MG capsule  Exacerbation of recurrent issue. Will send in doxy 50 mg bid for 14 d which typically works well for her. Cont warm soaks/compresses. Tylenol prn.  F/u prn. The patient voiced understanding and agreement to the plan.  Mabel [place], DO 05/23/24 4:39 PM

## 2024-06-05 LAB — HM MAMMOGRAPHY

## 2024-06-18 NOTE — Telephone Encounter (Signed)
 open in error

## 2024-06-21 ENCOUNTER — Encounter: Payer: Self-pay | Admitting: Pharmacist

## 2024-06-21 NOTE — Progress Notes (Signed)
 This patient is appearing on a report for being at risk of failing the adherence measure for hypertension (ACEi/ARB) medications this calendar year.   Medication: edarbyclor 40-12.5 Last fill date: 05/28/24 for 30 day supply. Will recheck adherence / follow up with patient at next re-fill due 06/27/24.   Completed chart review

## 2024-07-11 ENCOUNTER — Encounter: Payer: Self-pay | Admitting: Pharmacist

## 2024-07-11 NOTE — Progress Notes (Signed)
 This patient is appearing on a report for being at risk of failing the adherence measure for hypertension (ACEi/ARB) medications this calendar year.   Medication: Edarbyclor Last fill date: 06/28/24 for 30 day supply  Reviewed medication indication, dosing, and goals of therapy.

## 2024-07-16 ENCOUNTER — Telehealth: Payer: Self-pay

## 2024-07-16 NOTE — Telephone Encounter (Signed)
 Dr. Frann is not accepting new patients or transfer of cares. Error CRM sent asking e2c2 to make Pt aware and to remove her from Dr. Gena schedule on 07/19/24.

## 2024-07-16 NOTE — Telephone Encounter (Signed)
 Copied from CRM #[id number]. Topic: Appointments - Transfer of Care >> Jul 16, 2024  1:04 PM Anairis L wrote: Pt is requesting to transfer FROM: Burns Pt is requesting to transfer TO: Frann Reason for requested transfer: Feels that [facility] doesn't not understand her health concerns. It is the responsibility of the team the patient would like to transfer to (Dr. Frann.) to reach out to the patient if for any reason this transfer is not acceptable.

## 2024-07-18 NOTE — Telephone Encounter (Signed)
 Marissa King   07/17/2024  5:33 PM  Thank you for submitting this issue for investigation. After a thorough review, we have determined that an error was made by an E2C2 team member. We have addressed the matter directly with the agent involved. We appreciate you bringing this to our attention.   Error/Investigation Details:    Call [id number]   After listening to the call associated with the error reported, it does appear the error is valid. The specialist will perform outreach to reschedule the patient with a provider who is accepting new patients at this time. Thank you for your dedication to patient care and your participation in reporting this error for investigation.

## 2024-07-19 ENCOUNTER — Ambulatory Visit: Admitting: Family Medicine

## 2024-07-19 LAB — OPHTHALMOLOGY REPORT-SCANNED

## 2024-09-26 ENCOUNTER — Ambulatory Visit: Payer: 59

## 2024-09-30 ENCOUNTER — Encounter: Admitting: Internal Medicine
